# Patient Record
Sex: Male | Born: 1940 | Race: White | Hispanic: No | Marital: Married | Smoking: Never smoker
Health system: Southern US, Community
[De-identification: ages and names within clinical notes are randomized; demographics above are authoritative.]

## PROBLEM LIST (undated history)

## (undated) DIAGNOSIS — B019 Varicella without complication: Secondary | ICD-10-CM

## (undated) DIAGNOSIS — D72819 Decreased white blood cell count, unspecified: Secondary | ICD-10-CM

## (undated) DIAGNOSIS — K219 Gastro-esophageal reflux disease without esophagitis: Secondary | ICD-10-CM

## (undated) DIAGNOSIS — D7381 Neutropenic splenomegaly: Secondary | ICD-10-CM

## (undated) HISTORY — DX: Neutropenic splenomegaly: D73.81

## (undated) HISTORY — PX: NO PAST SURGERIES: SHX2092

## (undated) HISTORY — DX: Gastro-esophageal reflux disease without esophagitis: K21.9

## (undated) HISTORY — DX: Varicella without complication: B01.9

## (undated) HISTORY — DX: Decreased white blood cell count, unspecified: D72.819

---

## 2013-07-07 ENCOUNTER — Encounter (HOSPITAL_BASED_OUTPATIENT_CLINIC_OR_DEPARTMENT_OTHER): Payer: Self-pay | Admitting: Emergency Medicine

## 2013-07-07 ENCOUNTER — Emergency Department (HOSPITAL_BASED_OUTPATIENT_CLINIC_OR_DEPARTMENT_OTHER)
Admission: EM | Admit: 2013-07-07 | Discharge: 2013-07-07 | Disposition: A | Discharge status: Home / Self Care | Payer: Medicare Other | Attending: Emergency Medicine | Admitting: Emergency Medicine

## 2013-07-07 ENCOUNTER — Emergency Department (HOSPITAL_BASED_OUTPATIENT_CLINIC_OR_DEPARTMENT_OTHER): Payer: Medicare Other

## 2013-07-07 DIAGNOSIS — D72819 Decreased white blood cell count, unspecified: Secondary | ICD-10-CM | POA: Diagnosis not present

## 2013-07-07 DIAGNOSIS — Z79899 Other long term (current) drug therapy: Secondary | ICD-10-CM | POA: Diagnosis not present

## 2013-07-07 DIAGNOSIS — D696 Thrombocytopenia, unspecified: Secondary | ICD-10-CM | POA: Diagnosis not present

## 2013-07-07 DIAGNOSIS — R5383 Other fatigue: Secondary | ICD-10-CM | POA: Diagnosis not present

## 2013-07-07 DIAGNOSIS — R4789 Other speech disturbances: Secondary | ICD-10-CM | POA: Diagnosis not present

## 2013-07-07 DIAGNOSIS — D709 Neutropenia, unspecified: Secondary | ICD-10-CM | POA: Diagnosis not present

## 2013-07-07 DIAGNOSIS — R5381 Other malaise: Secondary | ICD-10-CM | POA: Insufficient documentation

## 2013-07-07 LAB — BASIC METABOLIC PANEL
BUN: 7 mg/dL (ref 6–23)
CALCIUM: 9.6 mg/dL (ref 8.4–10.5)
CHLORIDE: 111 meq/L (ref 96–112)
CO2: 20 mEq/L (ref 19–32)
CREATININE: 0.8 mg/dL (ref 0.50–1.35)
GFR, EST NON AFRICAN AMERICAN: 88 mL/min — AB (ref >90)
Glucose, Bld: 98 mg/dL (ref 70–99)
Potassium: 3.9 mEq/L (ref 3.7–5.3)
Sodium: 143 mEq/L (ref 137–147)

## 2013-07-07 LAB — HEPATIC FUNCTION PANEL
ALT: 26 U/L (ref 0–53)
AST: 33 U/L (ref 0–37)
Albumin: 3.4 g/dL — ABNORMAL LOW (ref 3.5–5.2)
Alkaline Phosphatase: 69 U/L (ref 39–117)
Bilirubin, Direct: <0.2 mg/dL (ref 0.0–0.3)
Total Bilirubin: 1 mg/dL (ref 0.3–1.2)
Total Protein: 6.6 g/dL (ref 6.0–8.3)

## 2013-07-07 LAB — URINALYSIS, ROUTINE W REFLEX MICROSCOPIC
Bilirubin Urine: NEGATIVE
Glucose, UA: NEGATIVE mg/dL
Hgb urine dipstick: NEGATIVE
Ketones, ur: NEGATIVE mg/dL
Leukocytes, UA: NEGATIVE
Nitrite: NEGATIVE
Protein, ur: NEGATIVE mg/dL
Specific Gravity, Urine: 1.006 (ref 1.005–1.030)
Urobilinogen, UA: 1 mg/dL (ref 0.0–1.0)
pH: 7.5 (ref 5.0–8.0)

## 2013-07-07 LAB — DIFFERENTIAL
Basophils Absolute: 0 10*3/uL (ref 0.0–0.1)
Basophils Relative: 1 % (ref 0–1)
EOS PCT: 4 % (ref 0–5)
Eosinophils Absolute: 0.1 10*3/uL (ref 0.0–0.7)
LYMPHS ABS: 1 10*3/uL (ref 0.7–4.0)
Lymphocytes Relative: 33 % (ref 12–46)
Monocytes Absolute: 0.3 10*3/uL (ref 0.1–1.0)
Monocytes Relative: 10 % (ref 3–12)
NEUTROS PCT: 52 % (ref 43–77)
Neutro Abs: 1.6 10*3/uL — ABNORMAL LOW (ref 1.7–7.7)

## 2013-07-07 LAB — CBC
HCT: 39.8 % (ref 39.0–52.0)
Hemoglobin: 14.3 g/dL (ref 13.0–17.0)
MCH: 33 pg (ref 26.0–34.0)
MCHC: 35.9 g/dL (ref 30.0–36.0)
MCV: 91.9 fL (ref 78.0–100.0)
Platelets: 66 K/uL — ABNORMAL LOW (ref 150–400)
RBC: 4.33 MIL/uL (ref 4.22–5.81)
RDW: 13.5 % (ref 11.5–15.5)
WBC: 2.9 K/uL — ABNORMAL LOW (ref 4.0–10.5)

## 2013-07-07 LAB — CBG MONITORING, ED: Glucose-Capillary: 88 mg/dL (ref 70–99)

## 2013-07-07 NOTE — ED Notes (Signed)
IV d/c, cath intact, pt tolerated well

## 2013-07-07 NOTE — ED Provider Notes (Signed)
CSN: [id number]     Arrival date & time 07/07/13  1114 History   First MD Initiated Contact with Patient 07/07/13 1318     Chief Complaint  Patient presents with  . Aphasia     (Consider location/radiation/quality/duration/timing/severity/associated sxs/prior Treatment) HPI 73 year old male presents with slurred speech and fatigue over the last week or so. His wife states that she notices speech was slurred a week ago and has been gradually improving. The patient never had any facial droop. He had a "sinus headache" about 3 weeks ago that is improved or resolved. No nausea, vomiting, or focal weakness. She initially stated he had trouble walking, but she clarified this to be that he is walking slowly. He's not had any stumbling or trouble walking mechanical standpoint. He seems somewhat fatigued but this is been improving. He's not had any weight loss or blood in his stools. At this time she states his slurred speech is almost back to normal but still slightly abnormal. She discussed all this with her son and he recommended she come to the ER. The patient has no known medical problems but has not seen a doctor in about 30 years because he has not had any issues.  History reviewed. No pertinent past medical history. History reviewed. No pertinent past surgical history. No family history on file. History  Substance Use Topics  . Smoking status: Never Smoker   . Smokeless tobacco: Not on file  . Alcohol Use: No    Review of Systems  Constitutional: Positive for fatigue. Negative for fever.  Cardiovascular: Negative for chest pain.  Gastrointestinal: Negative for nausea, vomiting, abdominal pain, diarrhea and blood in stool.  Genitourinary: Negative for dysuria.  Neurological: Positive for speech difficulty. Negative for dizziness, weakness, light-headedness and headaches.  All other systems reviewed and are negative.     Allergies  Review of patient's allergies indicates no known  allergies.  Home Medications   Prior to Admission medications   Medication Sig Start Date End Date Taking? Authorizing Provider  omeprazole (PRILOSEC) 10 MG capsule Take 10 mg by mouth daily.   Yes Historical Provider, MD   BP 160/88  Pulse 99  Temp(Src) 98 F (36.7 C) (Oral)  Resp 20  Ht 5' 9"  (1.753 m)  Wt 195 lb (88.451 kg)  BMI 28.78 kg/m2  SpO2 99% Physical Exam  Nursing note and vitals reviewed. Constitutional: He is oriented to person, place, and time. He appears well-developed and well-nourished.  HENT:  Head: Normocephalic and atraumatic.  Right Ear: External ear normal.  Left Ear: External ear normal.  Nose: Nose normal.  Eyes: EOM are normal. Pupils are equal, round, and reactive to light. Right eye exhibits no discharge. Left eye exhibits no discharge.  Neck: Neck supple.  Cardiovascular: Normal rate, regular rhythm, normal heart sounds and intact distal pulses.   Pulmonary/Chest: Effort normal.  Abdominal: Soft. He exhibits no distension. There is no tenderness.  Musculoskeletal: He exhibits no edema.  Neurological: He is alert and oriented to person, place, and time. He has normal strength and normal reflexes. No cranial nerve deficit or sensory deficit. He exhibits normal muscle tone. GCS eye subscore is 4. GCS verbal subscore is 5. GCS motor subscore is 6.  CN 2-12 grossly intact. 5/5 strength in all 4 extremities. Normal finger to nose. Normal gait. No slurred speech noted.  Skin: Skin is warm and dry.    ED Course  Procedures (including critical care time) Labs Review Labs Reviewed  BASIC METABOLIC PANEL -  Abnormal; Notable for the following:    GFR calc non Af Amer 88 (*)    All other components within normal limits  CBC - Abnormal; Notable for the following:    WBC 2.9 (*)    Platelets 66 (*)    All other components within normal limits  HEPATIC FUNCTION PANEL - Abnormal; Notable for the following:    Albumin 3.4 (*)    All other components within  normal limits  DIFFERENTIAL - Abnormal; Notable for the following:    Neutro Abs 1.6 (*)    All other components within normal limits  URINALYSIS, ROUTINE W REFLEX MICROSCOPIC  PATHOLOGIST SMEAR REVIEW  CBG MONITORING, ED    Imaging Review Ct Head Wo Contrast  07/07/2013   CLINICAL DATA:  Slurred speech and unsteady gait.  EXAM: CT HEAD WITHOUT CONTRAST  TECHNIQUE: Contiguous axial images were obtained from the base of the skull through the vertex without intravenous contrast.  COMPARISON:  None.  FINDINGS: No acute cortical infarct, hemorrhage, or mass lesion ispresent. Ventricles are of normal size. No significant extra-axial fluid collection is present. The paranasal sinuses andmastoid air cells are clear. The osseous skull is intact.  IMPRESSION: Normal exam.   Electronically Signed   By: Kerby Moors M.D.   On: 07/07/2013 13:58     EKG Interpretation   Date/Time:  Sunday July 07 2013 12:33:44 EDT Ventricular Rate:  82 PR Interval:  174 QRS Duration: 110 QT Interval:  404 QTC Calculation: 472 R Axis:   65 Text Interpretation:  Normal sinus rhythm Normal ECG No old tracing to  compare Confirmed by Norris ([id number]) on 07/07/2013 12:46:51 PM      MDM   Final diagnoses:  Fatigue  Thrombocytopenia  Leukopenia    Patient's exam is benign here. Normal neuro testing. I do not notice any slurred speech or focal neuro deficits. His exam is otherwise unremarkable. No signs of dehydration. No obvious cause for his vague fatigue. His CT is benign after 1 week's worth of symptoms. I discussed his leukopenia and thrombocytopenia with Dr. Jana Hakim, hematology on call, who recommends sending a peripheral smear for pathology review and referring to hematology here in [facility] (Dr. Marin Olp). Patient also needs a PCP, given referrals. I stressed the importance of follow up and establishing PCP. It's possible he had a stroke, but as he is asymptomatic, I feel he can follow up for  close testing with a PCP and possibly an outpatient MRI. Discussed with Dr. Leonel Ramsay, who agrees at this time, recommends baby ASA but with patient's thrombocytopenia will hold off until evaluated by heme/onc    Ephraim Hamburger, MD 07/07/13 [phone]

## 2013-07-07 NOTE — ED Notes (Addendum)
Patient began having slurred speech and unsteady gait about a week ago. Wife states that their son is who told them to come to the ER, otherwise patient has not seen a dr. Since this began. She states that today he is improved from what he was when these symptoms started. Wife states he has not seen a dr in appx 20 years. No noticeable deficits

## 2013-07-07 NOTE — Discharge Instructions (Signed)
Fatigue Fatigue is a feeling of tiredness, lack of energy, lack of motivation, or feeling tired all the time. Having enough rest, good nutrition, and reducing stress will normally reduce fatigue. Consult your caregiver if it persists. The nature of your fatigue will help your caregiver to find out its cause. The treatment is based on the cause.  CAUSES  There are many causes for fatigue. Most of the time, fatigue can be traced to one or more of your habits or routines. Most causes fit into one or more of three general areas. They are: Lifestyle problems  Sleep disturbances.  Overwork.  Physical exertion.  Unhealthy habits.  Poor eating habits or eating disorders.  Alcohol and/or drug use .  Lack of proper nutrition (malnutrition). Psychological problems  Stress and/or anxiety problems.  Depression.  Grief.  Boredom. Medical Problems or Conditions  Anemia.  Pregnancy.  Thyroid gland problems.  Recovery from major surgery.  Continuous pain.  Emphysema or asthma that is not well controlled  Allergic conditions.  Diabetes.  Infections (such as mononucleosis).  Obesity.  Sleep disorders, such as sleep apnea.  Heart failure or other heart-related problems.  Cancer.  Kidney disease.  Liver disease.  Effects of certain medicines such as antihistamines, cough and cold remedies, prescription pain medicines, heart and blood pressure medicines, drugs used for treatment of cancer, and some antidepressants. SYMPTOMS  The symptoms of fatigue include:   Lack of energy.  Lack of drive (motivation).  Drowsiness.  Feeling of indifference to the surroundings. DIAGNOSIS  The details of how you feel help guide your caregiver in finding out what is causing the fatigue. You will be asked about your present and past health condition. It is important to review all medicines that you take, including prescription and non-prescription items. A thorough exam will be done.  You will be questioned about your feelings, habits, and normal lifestyle. Your caregiver may suggest blood tests, urine tests, or other tests to look for common medical causes of fatigue.  TREATMENT  Fatigue is treated by correcting the underlying cause. For example, if you have continuous pain or depression, treating these causes will improve how you feel. Similarly, adjusting the dose of certain medicines will help in reducing fatigue.  HOME CARE INSTRUCTIONS   Try to get the required amount of good sleep every night.  Eat a healthy and nutritious diet, and drink enough water throughout the day.  Practice ways of relaxing (including yoga or meditation).  Exercise regularly.  Make plans to change situations that cause stress. Act on those plans so that stresses decrease over time. Keep your work and personal routine reasonable.  Avoid street drugs and minimize use of alcohol.  Start taking a daily multivitamin after consulting your caregiver. SEEK MEDICAL CARE IF:   You have persistent tiredness, which cannot be accounted for.  You have fever.  You have unintentional weight loss.  You have headaches.  You have disturbed sleep throughout the night.  You are feeling sad.  You have constipation.  You have dry skin.  You have gained weight.  You are taking any new or different medicines that you suspect are causing fatigue.  You are unable to sleep at night.  You develop any unusual swelling of your legs or other parts of your body. SEEK IMMEDIATE MEDICAL CARE IF:   You are feeling confused.  Your vision is blurred.  You feel faint or pass out.  You develop severe headache.  You develop severe abdominal, pelvic, or  back pain.  You develop chest pain, shortness of breath, or an irregular or fast heartbeat.  You are unable to pass a normal amount of urine.  You develop abnormal bleeding such as bleeding from the rectum or you vomit blood.  You have thoughts  about harming yourself or committing suicide.  You are worried that you might harm someone else. MAKE SURE YOU:   Understand these instructions.  Will watch your condition.  Will get help right away if you are not doing well or get worse. Document Released: 11/07/2006 Document Revised: 04/04/2011 Document Reviewed: 11/07/2006 [facility] Patient Information 2014 [place].    Emergency Department Resource Guide 1) Find a Doctor and Pay Out of Pocket Although you won't have to find out who is covered by your insurance plan, it is a good idea to ask around and get recommendations. You will then need to call the office and see if the doctor you have chosen will accept you as a new patient and what types of options they offer for patients who are self-pay. Some doctors offer discounts or will set up payment plans for their patients who do not have insurance, but you will need to ask so you aren't surprised when you get to your appointment.  2) Contact Your Local Health Department Not all health departments have doctors that can see patients for sick visits, but many do, so it is worth a call to see if yours does. If you don't know where your local health department is, you can check in your phone book. The CDC also has a tool to help you locate your state's health department, and many state websites also have listings of all of their local health departments.  3) Find a [facility] If your illness is not likely to be very severe or complicated, you may want to try a walk in clinic. These are popping up all over the country in pharmacies, drugstores, and shopping centers. They're usually staffed by nurse practitioners or physician assistants that have been trained to treat common illnesses and complaints. They're usually fairly quick and inexpensive. However, if you have serious medical issues or chronic medical problems, these are probably not your best option.  No Primary Care  Doctor: - Call Health Connect at  [phone] - they can help you locate a primary care doctor that  accepts your insurance, provides certain services, etc. - Physician Referral Service- [phone]  Chronic Pain Problems: Organization         Address  Phone   Notes  [facility]  [phone] Patients need to be referred by their primary care doctor.   Medication Assistance: Organization         Address  Phone   Notes  [facility] Medication [facility] [address] [place], [place] [address] [phone] --Must be a resident of [facility] -- Must have NO insurance coverage whatsoever (no Medicaid/ Medicare, etc.) -- The pt. MUST have a primary care doctor that directs their care regularly and follows them in the community   MedAssist  [phone]   [employer]  [phone]    Agencies that provide inexpensive medical care: Organization         Address  Phone   Notes  [place]  [phone]   [facility]    [phone]   [facility] [place], [place] [address] [phone]  [facility] [address] [place] [phone]   Planned Parenthood    [phone]   [facility]    [phone]   [place] and [place] [address], [place] Phone:  [phone], Fax:  [phone] Hours of Operation:  9 am - 6 pm, M-F.  Also accepts Medicaid/Medicare and self-pay.  [facility] for [place], [address], [place] Phone: [phone], Fax: [phone]. Hours of Operation:  8:30 am - 5:30 pm, M-F.  Also accepts Medicaid and self-pay.  [facility] [address] [place] Phone: [phone]   [place], [place], [place] [phone], Ext. [phone] Mondays & Thursdays: 7-9 AM.  First 15 patients are seen on a first  come, first serve basis.    [place] Providers:  Organization         Address  Phone   Notes  [facility] [address], [place] [phone] Also accepts self-pay patients.  [facility] [address], [place]  [phone]   [place], [address], [place] [phone]   [facility] [address], [place] [phone]   Lucianne Lei [address], [place]   [phone] Only accepts [place] Access [place] patients after they have their name applied to their card.   Self-Pay (no insurance) in [facility]:  Organization         Address  Phone   Notes  Sickle Cell Patients, [facility] [place] [phone]   [facility] [place] [phone]   [facility] [place]  [place], [place], [place] [phone]   [facility]/Dr. Osei-Bonsu  [address] [place] or [address], [place] [phone] Phone number for both [place] and [place] locations is the same.  [facility]gent [facility] and [facility] [address] [place] [phone]   [facility] [address] [place] or [address] [phone]   [facility] [address] [place] [phone], phone; [phone], fax Sees patients 1st and 3rd Saturday of every month.  Must not qualify for public or private insurance (i.e. Medicaid, Medicare, [place] Health Choice, Veterans' Benefits)  Household income should be no more than 200% of the poverty level The clinic cannot treat you if you are pregnant or think you are pregnant  Sexually transmitted diseases are not treated at the clinic.    Dental Care: Organization          Address  Phone  Notes  [facility] Department of [facility] [phone] Accepts children up to age 35 who are enrolled in [place] or [place]; pregnant women with a Medicaid card; and children who have applied for Medicaid or  Health Choice, but were declined, whose parents can pay a reduced fee at time of service.  [facility] Department of [facility]  [address], [place] [phone] Accepts children up to age 49 who are enrolled in [place] or [place]; pregnant women with a Medicaid card; and  children who have applied for Medicaid or [place] Health Choice, but were declined, whose parents can pay a reduced fee at time of service.  [place] Adult [facility] Access PROGRAM  [place] [phone] Patients are seen by appointment only. Walk-ins are not accepted. [place] will see patients 63 years of age and older. Monday - Tuesday (8am-5pm) Most Wednesdays (8:30-5pm) $30 per visit, cash only  [facility] Adult Dental Access PROGRAM  [address], [facility] [phone] Patients are seen by appointment only. Walk-ins are not accepted. [place] will see patients 60 years of age and older. One Wednesday Evening (Monthly: Volunteer Based).  $30 per visit, cash only  [place]  [phone] for adults; Children under age 35, call Graduate Pediatric Dentistry at [phone]. Children aged 90-14, please call [phone] to request a pediatric application.  Dental services are provided in all areas of dental care including fillings, crowns and bridges, complete and partial dentures, implants, gum treatment, root canals, and extractions. Preventive care is also provided. Treatment is provided to both adults and children. Patients are selected via a lottery and there is often a waiting list.   [facility] [address] [place]  [phone] [email or web address]   [facility] [address] [place], [place] [phone], Ext. [phone] Second and Fourth Thursday of each month, opens at 6:30 AM; Clinic ends at 9 AM.  Patients are seen on a first-come first-served basis, and a limited number are seen during each clinic.   [facility]  [address] Hillard Danker [place], [place] [phone]   Eligibility Requirements You must have lived in [place], [place], or [place] counties for at least the last three months.   You cannot be eligible for state or federal sponsored [employer], including [employer], [place], or [employer].   You generally cannot be eligible for healthcare insurance through your employer.    How to apply: Eligibility screenings are held every Tuesday and Wednesday afternoon from 1:00 pm until 4:00 pm. You do not need an appointment for the interview!  [facility] [address] [place], [place]   [place]  [place] Department  [place]  [phone]    Behavioral Health Resources in the Community: Intensive Outpatient Programs Organization         Address  Phone  Notes  [place]. [address], [place], [place] [phone]   [facility] [address] [place], [place]   ADS: Alcohol & Drug Svcs [address] [place], [place]   [place] [address]  [place], [place] or [phone]   Substance Abuse Resources Organization         Address  Phone  Notes  Alcohol and Drug Services  [phone]   [place]  [phone]   The [place]   Chinita Pester  [phone]   Residential & Outpatient Substance Abuse Program  [phone]   Psychological  Services Organization         Address  Phone  Notes  [facility] [place]  [place]  [phone]   [place] [address] [place] or [phone]    Mobile Crisis Teams Organization  Address  Phone  Notes  [facility], Mobile Crisis Care Unit  [phone]   [facility] Psychotherapeutic Services  [address] [place], [place]   [facility] [address] [place] [phone]    Self-Help/Support Groups Organization         Address  Phone             Notes  [place]. of [place] - variety of support groups  [place] Call for more information  Narcotics Anonymous (NA), [facility] [address], [employer] [place]  2 meetings at this location   Special educational needs teacher         Address  Phone  Notes  ASAP Residential Treatment [place],    [place]  [phone]   [facility]  [address] [place] [phone], [place], [place]   [place], [place] [phone] Admissions: 8am-3pm M-F  Incentives Substance [place] [address]    [place], [place] [phone]   [facility] [address] [place], [place], [place]   [facility] [address],  [place], [place]   [facility] - Intensive Outpatient [address], [place], [place]   [facility] ([place].) [place].,  [place], [place] [phone] or [phone]   [facility] Services (RTS) [address] [place], [place] Accepts Medicaid  [facility] [place] [address],  [place] [phone] Substance Abuse/Addiction Treatment   [facility] Organization         Address  Phone  Notes  [facility]  [phone]   Domenic Schwab, PhD [address] Arlis Porta [place], [place]   [phone] or [phone]   [place], [place] [phone]   [facility] [address], [place], [place] [phone] Insurance/Medicaid/sponsorship through [facility] and Families [address] [place]                                    [place], [place] [phone] Maish Vaya [address], [place] [phone]    Dr. Adele Schilder  [phone]   [facility] Dept. 1) [address] [place] [address],  City 2) [place] 3)  [address], [place] [phone]  [phone]   [place] [phone] or [phone] (After Hours)

## 2013-07-08 LAB — PATHOLOGIST SMEAR REVIEW

## 2013-07-10 ENCOUNTER — Telehealth: Payer: Self-pay | Admitting: Hematology & Oncology

## 2013-07-10 NOTE — Telephone Encounter (Signed)
Pt aware of 6-23 appointment

## 2013-07-16 ENCOUNTER — Encounter: Payer: Self-pay | Admitting: Hematology & Oncology

## 2013-07-16 ENCOUNTER — Other Ambulatory Visit (HOSPITAL_BASED_OUTPATIENT_CLINIC_OR_DEPARTMENT_OTHER): Payer: Medicare Other | Admitting: Lab

## 2013-07-16 ENCOUNTER — Ambulatory Visit: Payer: Medicare Other

## 2013-07-16 ENCOUNTER — Ambulatory Visit (HOSPITAL_BASED_OUTPATIENT_CLINIC_OR_DEPARTMENT_OTHER): Payer: Medicare Other | Admitting: Hematology & Oncology

## 2013-07-16 VITALS — BP 150/84 | HR 93 | Temp 97.6°F | Resp 18 | Ht 69.0 in | Wt 201.0 lb

## 2013-07-16 DIAGNOSIS — D72819 Decreased white blood cell count, unspecified: Secondary | ICD-10-CM | POA: Diagnosis not present

## 2013-07-16 DIAGNOSIS — D61818 Other pancytopenia: Secondary | ICD-10-CM

## 2013-07-16 DIAGNOSIS — D696 Thrombocytopenia, unspecified: Secondary | ICD-10-CM

## 2013-07-16 DIAGNOSIS — D649 Anemia, unspecified: Secondary | ICD-10-CM | POA: Diagnosis not present

## 2013-07-16 DIAGNOSIS — M329 Systemic lupus erythematosus, unspecified: Secondary | ICD-10-CM | POA: Diagnosis not present

## 2013-07-16 DIAGNOSIS — D510 Vitamin B12 deficiency anemia due to intrinsic factor deficiency: Secondary | ICD-10-CM | POA: Diagnosis not present

## 2013-07-16 LAB — CBC WITH DIFFERENTIAL (CANCER CENTER ONLY)
BASO#: 0 10*3/uL (ref 0.0–0.2)
BASO%: 1.1 % (ref 0.0–2.0)
EOS%: 3.4 % (ref 0.0–7.0)
Eosinophils Absolute: 0.1 10*3/uL (ref 0.0–0.5)
HEMATOCRIT: 43 % (ref 38.7–49.9)
HGB: 15.2 g/dL (ref 13.0–17.1)
LYMPH#: 1.4 10*3/uL (ref 0.9–3.3)
LYMPH%: 40.3 % (ref 14.0–48.0)
MCH: 32.5 pg (ref 28.0–33.4)
MCHC: 35.3 g/dL (ref 32.0–35.9)
MCV: 92 fL (ref 82–98)
MONO#: 0.4 10*3/uL (ref 0.1–0.9)
MONO%: 10.4 % (ref 0.0–13.0)
NEUT#: 1.6 10*3/uL (ref 1.5–6.5)
NEUT%: 44.8 % (ref 40.0–80.0)
Platelets: 80 10*3/uL — ABNORMAL LOW (ref 145–400)
RBC: 4.68 10*6/uL (ref 4.20–5.70)
RDW: 14.1 % (ref 11.1–15.7)
WBC: 3.6 10*3/uL — AB (ref 4.0–10.0)

## 2013-07-16 LAB — CHCC SATELLITE - SMEAR

## 2013-07-16 LAB — IRON AND TIBC CHCC
%SAT: 51 % (ref 20–55)
Iron: 160 ug/dL (ref 42–163)
TIBC: 313 ug/dL (ref 202–409)
UIBC: 152 ug/dL (ref 117–376)

## 2013-07-16 LAB — FERRITIN CHCC: FERRITIN: 59 ng/mL (ref 22–316)

## 2013-07-17 ENCOUNTER — Telehealth: Payer: Self-pay | Admitting: Hematology & Oncology

## 2013-07-17 NOTE — Telephone Encounter (Signed)
Pt aware of 9-24 appointment

## 2013-07-17 NOTE — Progress Notes (Signed)
Referral MD  Reason for Referral: Leukopenia and thrombocytopenia   Chief Complaint  Patient presents with  . HOSPITAL FOLLOW UP, NEW PATIENT  : I am here because my blood  HPI: Steve Watson is a 73 year old gentleman. He does not have a family doctor. He's not been to a doctor for a long time. He is on no Medications. He is working full-time.  He went to the emergency room because of some slurred speech and some difficulty walking. He had a CT of the brain which is negative. Get lab work done which showed a white cell count 2.9. His platelet count was 66,000. Hemoglobin was 14.3. His life at for okay.  He was then referred to the [facility] for an evaluation.  He's had no rashes. There's been no bleeding. He's had no bruising. He's had no abdominal pain. He's had no cough. He does not smoke or drink.  Is no occupational exposures where he works.  He's had no difficulties talking right now. He's had no visual issues. He's not noted any palpable lymph nodes. He is not a vegetarian. He has been eating good. He's had no weight loss or weight gain.   Again, he was referred to the [facility] for an evaluation.   No past medical history on file.:  No past surgical history on file.:  Current outpatient prescriptions:calcium & magnesium carbonates (MYLANTA) 311-232 MG per tablet, Take 1 tablet by mouth daily., Disp: , Rfl: ;  IRON, FERROUS GLUCONATE, PO, Take by mouth every morning., Disp: , Rfl: ;  omeprazole (PRILOSEC) 10 MG capsule, Take 10 mg by mouth as needed. , Disp: , Rfl: :  :  No Known Allergies:  No family history on file.:  History   Social History  . Marital Status: Married    Spouse Name: N/A    Number of Children: N/A  . Years of Education: N/A   Occupational History  . Not on file.   Social History Main Topics  . Smoking status: Never Smoker   . Smokeless tobacco: Never Used     Comment: NEVER USED TOBACCO  . Alcohol Use: No  . Drug Use: Not on file   . Sexual Activity: Not on file   Other Topics Concern  . Not on file   Social History Narrative  . No narrative on file  :  Pertinent items are noted in HPI.  Exam: @IPVITALS @  well-developed well-nourished white cell and in no obvious distress. Vital signs show a temperature of 97.6. Pulse 93. Blood pressure 150/84. Weight is 201 pounds. Head and neck exam shows no ocular or oral lesions. Is no sclera icterus. Has no adenopathy in the neck. Extraocular muscles are intact. There are no intraoral lesions. Lungs are clear. Cardiac exam regular in rhythm. Abdomen soft. Has good bowel sounds. There is no palpable liver or spleen tip. Back exam shows no tenderness over the spine ribs or hips. Extremities shows no clubbing cyanosis or edema. Skin exam no rashes, ecchymoses or petechia. Neurological exam shows no focal neurological deficits.    Recent Labs  07/16/13 0806  WBC 3.6*  HGB 15.2  HCT 43.0  PLT 80 Platelet count consistent in citrate*   No results found for this basename: NA, K, CL, CO2, GLUCOSE, BUN, CREATININE, CALCIUM,  in the last 72 hours  Blood smear review: Normochromic and normocytic population of red blood cells. There are no nucleated red blood cells. I see no teardrop cells. He has no rouleau formation.  There are no target cells. I see no schistocytes or spherocytes. White cells are slightly decreased number. He has no hypersegmented polys. I see no immature myeloid cells per there are no atypical lymphocytes. Toes are decreased in number. Platelets are small. Platelets are well granulated.  Pathology: No data     Assessment and Plan: He is a 73 year old gentleman with leukopenia and thrombocytopenia.  So far, are lab work is normal. He has a normal LDH of 170. His iron studies are normal. Vitamin B12 is 480.  I find no on physical exam that was just a problem. I don't see any indication for a bone marrow issue on the blood smear. A 73 years old,, one might  suspect myelodysplasia.  His lab work is better that was weeks ago. Maybe he has have a viral syndrome that is resolving.  I see no risk factors for hepatitis or HIV.  He's been totally asymptomatic.  I do not see that we have to do a bone marrow test on him. I just don't think this would be fruitful right now.  Is a good hour with he and his wife. They're both very nice.  We will see how his lab work looks. Then we will plan to get him back to see [place] depending on what the labs show.

## 2013-07-18 ENCOUNTER — Telehealth: Payer: Self-pay | Admitting: *Deleted

## 2013-07-18 LAB — RETICULOCYTES (CHCC)
ABS Retic: 46.9 10*3/uL (ref 19.0–186.0)
RBC.: 4.69 MIL/uL (ref 4.22–5.81)
RETIC CT PCT: 1 % (ref 0.4–2.3)

## 2013-07-18 LAB — PROTEIN ELECTROPHORESIS, SERUM, WITH REFLEX
ALBUMIN ELP: 55.5 % — AB (ref 55.8–66.1)
Alpha-1-Globulin: 2.7 % — ABNORMAL LOW (ref 2.9–4.9)
Alpha-2-Globulin: 9.1 % (ref 7.1–11.8)
Beta 2: 6.3 % (ref 3.2–6.5)
Beta Globulin: 6.7 % (ref 4.7–7.2)
Gamma Globulin: 19.7 % — ABNORMAL HIGH (ref 11.1–18.8)
TOTAL PROTEIN, SERUM ELECTROPHOR: 6.7 g/dL (ref 6.0–8.3)

## 2013-07-18 LAB — LUPUS ANTICOAGULANT PANEL
DRVVT: 40.5 secs (ref <42.9)
Lupus Anticoagulant: NOT DETECTED
PTT LA: 45.6 s — AB (ref 28.0–43.0)
PTTLA 41 MIX: 50.3 s — AB (ref 28.0–43.0)
PTTLA CONFIRMATION: 1.1 s (ref <8.0)

## 2013-07-18 LAB — ERYTHROPOIETIN: Erythropoietin: 14.2 m[IU]/mL (ref 2.6–18.5)

## 2013-07-18 LAB — CARDIOLIPIN ANTIBODIES, IGG, IGM, IGA
ANTICARDIOLIPIN IGA: 11 U/mL (ref <22)
ANTICARDIOLIPIN IGM: 15 [MPL'U]/mL — AB (ref <11)
Anticardiolipin IgG: 8 GPL U/mL (ref <23)

## 2013-07-18 LAB — LACTATE DEHYDROGENASE: LDH: 170 U/L (ref 94–250)

## 2013-07-18 LAB — VITAMIN B12: Vitamin B-12: 480 pg/mL (ref 211–911)

## 2013-07-18 LAB — RHEUMATOID FACTOR: RHEUMATOID FACTOR: 11 [IU]/mL (ref <=14)

## 2013-07-18 NOTE — Telephone Encounter (Signed)
Message copied by Rico Ala on Thu Jul 18, 2013 11:10 AM ------      Message from: Burney Gauze R      Created: Wed Jul 17, 2013  6:07 PM       Call - labs are ok so far!!  Laurey Arrow ------

## 2013-07-22 ENCOUNTER — Telehealth: Payer: Self-pay | Admitting: *Deleted

## 2013-07-22 NOTE — Telephone Encounter (Addendum)
Message copied by Lenn Sink on Mon Jul 22, 2013  9:21 AM ------      Message from: Volanda Napoleon      Created: Fri Jul 19, 2013  5:55 PM       Call - he needs to take a coated BAby aspirin - 1m - a day WITH food!!!!  pete ------Called and let pt know that Dr EMarin Olpwants him to take 822mbaby aspirin a day. Pt states he started taking the baby aspirin the day after his last visit.

## 2013-10-17 ENCOUNTER — Ambulatory Visit (HOSPITAL_BASED_OUTPATIENT_CLINIC_OR_DEPARTMENT_OTHER): Payer: Medicare Other | Admitting: Hematology & Oncology

## 2013-10-17 ENCOUNTER — Ambulatory Visit (HOSPITAL_BASED_OUTPATIENT_CLINIC_OR_DEPARTMENT_OTHER): Payer: Medicare Other | Admitting: Lab

## 2013-10-17 DIAGNOSIS — D696 Thrombocytopenia, unspecified: Secondary | ICD-10-CM

## 2013-10-17 DIAGNOSIS — D649 Anemia, unspecified: Secondary | ICD-10-CM

## 2013-10-17 DIAGNOSIS — D510 Vitamin B12 deficiency anemia due to intrinsic factor deficiency: Secondary | ICD-10-CM

## 2013-10-17 DIAGNOSIS — D61818 Other pancytopenia: Secondary | ICD-10-CM

## 2013-10-17 LAB — CBC WITH DIFFERENTIAL (CANCER CENTER ONLY)
BASO#: 0 10*3/uL (ref 0.0–0.2)
BASO%: 0.8 % (ref 0.0–2.0)
EOS%: 3.3 % (ref 0.0–7.0)
Eosinophils Absolute: 0.2 10*3/uL (ref 0.0–0.5)
HEMATOCRIT: 41.3 % (ref 38.7–49.9)
HGB: 14.8 g/dL (ref 13.0–17.1)
LYMPH#: 1.9 10*3/uL (ref 0.9–3.3)
LYMPH%: 38.8 % (ref 14.0–48.0)
MCH: 33.5 pg — AB (ref 28.0–33.4)
MCHC: 35.8 g/dL (ref 32.0–35.9)
MCV: 93 fL (ref 82–98)
MONO#: 0.4 10*3/uL (ref 0.1–0.9)
MONO%: 8.5 % (ref 0.0–13.0)
NEUT#: 2.4 10*3/uL (ref 1.5–6.5)
NEUT%: 48.6 % (ref 40.0–80.0)
Platelets: 108 10*3/uL — ABNORMAL LOW (ref 145–400)
RBC: 4.42 10*6/uL (ref 4.20–5.70)
RDW: 12.9 % (ref 11.1–15.7)
WBC: 4.8 10*3/uL (ref 4.0–10.0)

## 2013-10-17 LAB — CHCC SATELLITE - SMEAR

## 2013-10-17 NOTE — Progress Notes (Signed)
  Hematology and Oncology Follow Up Visit  Steve Watson [id number] 12/09/1940 73 y.o. 10/17/2013   Principle Diagnosis:   Thrombocytopenia  Current Therapy:    Observation     Interim History:  Mr.  Steve Watson is back for a second office visit. He's been doing quite well. When we first saw him, all his lower looked unremarkable. His blood smear looked okay.  He's had a positive bleeding. He's had a bruising. No weight loss or weight gain. He's had no fever. There's been no change in medications.  Medications: Current outpatient prescriptions:aspirin 81 MG tablet, Take 81 mg by mouth daily., Disp: , Rfl: ;  calcium & magnesium carbonates (MYLANTA) 311-232 MG per tablet, Take 1 tablet by mouth daily., Disp: , Rfl: ;  omeprazole (PRILOSEC) 10 MG capsule, Take 10 mg by mouth as needed. , Disp: , Rfl:   Allergies: No Known Allergies  Past Medical History, Surgical history, Social history, and Family History were reviewed and updated.  Review of Systems: As above  Physical Exam:  vitals were not taken for this visit.  Well-developed and well-nourished white gentleman. Vital signs are temperature of 98.6. Pulse 100. Blood pressure 166/90. Weight is 210 pounds. Head and neck exam shows no ocular or oral lesions. There are no palpable cervical or supraclavicular is. Lungs are clear. Cardiac exam regular rhythm. Abdomen soft. Has good bowel sounds. There is no fluid wave. There is no palpable liver or spleen tip. Extremities shows no clubbing, cyanosis or edema. Back exam no tenderness over the spine, ribs or hips. Neurological exam is nonfocal.  Lab Results  Component Value Date   WBC 4.8 10/17/2013   HGB 14.8 10/17/2013   HCT 41.3 10/17/2013   MCV 93 10/17/2013   PLT 108 Platelet count consistent in citrate* 10/17/2013     Chemistry      Component Value Date/Time   NA 143 07/07/2013 1215   K 3.9 07/07/2013 1215   CL 111 07/07/2013 1215   CO2 20 07/07/2013 1215   BUN 7 07/07/2013 1215   CREATININE 0.80 07/07/2013 1215      Component Value Date/Time   CALCIUM 9.6 07/07/2013 1215   ALKPHOS 69 07/07/2013 1215   AST 33 07/07/2013 1215   ALT 26 07/07/2013 1215   BILITOT 1.0 07/07/2013 1215         Impression and Plan: [name] is 73 year old gentleman. He has thrombocytopenia This is mild at best.  He actually is doing quite well. His blood count is improving.  I looked at his blood under the microscope. I do not see anything that looked suspicious. His platelets looked mature. He had well granulated platelets. His white cells and red cells appear to be mature. There were no nucleated red cells. He had no immature myeloid or atypical lymphoid cells.  I really do not see any problems with him. I cannot find anything on his physical exam.  For now, I did don't think we have to get him back to be seen. I just do not think we're adding anything to his medical care.  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  We can always see him back if he has any problems.  Volanda Napoleon, MD 9/24/20151:23 PM

## 2014-06-02 ENCOUNTER — Telehealth: Payer: Self-pay | Admitting: *Deleted

## 2014-06-02 ENCOUNTER — Encounter: Payer: Self-pay | Admitting: *Deleted

## 2014-06-02 NOTE — Telephone Encounter (Signed)
Unable to reach patient at time of Pre-Visit Call.  Left message for patient to return call when available.    

## 2014-06-02 NOTE — Addendum Note (Signed)
Addended by: Leticia Penna A on: 06/02/2014 05:07 PM   Modules accepted: Medications

## 2014-06-02 NOTE — Telephone Encounter (Signed)
Pre-Visit Call completed with patient and chart updated.   Pre-Visit Info documented in Specialty Comments under [facility].    

## 2014-06-02 NOTE — Telephone Encounter (Signed)
PATIENT CALLED BACK AND SAID TO CALL HIM AGAIN ANYTIME TODAY @ [phone]

## 2014-06-03 ENCOUNTER — Ambulatory Visit (INDEPENDENT_AMBULATORY_CARE_PROVIDER_SITE_OTHER): Payer: Medicare Other | Admitting: Physician Assistant

## 2014-06-03 ENCOUNTER — Encounter: Payer: Self-pay | Admitting: Physician Assistant

## 2014-06-03 VITALS — BP 150/80 | HR 115 | Temp 98.0°F | Wt 208.0 lb

## 2014-06-03 DIAGNOSIS — K219 Gastro-esophageal reflux disease without esophagitis: Secondary | ICD-10-CM | POA: Insufficient documentation

## 2014-06-03 DIAGNOSIS — R531 Weakness: Secondary | ICD-10-CM

## 2014-06-03 DIAGNOSIS — R000 Tachycardia, unspecified: Secondary | ICD-10-CM | POA: Diagnosis not present

## 2014-06-03 LAB — COMPREHENSIVE METABOLIC PANEL
ALT: 25 U/L (ref 0–53)
AST: 30 U/L (ref 0–37)
Albumin: 3.3 g/dL — ABNORMAL LOW (ref 3.5–5.2)
Alkaline Phosphatase: 67 U/L (ref 39–117)
BUN: 9 mg/dL (ref 6–23)
CALCIUM: 9.2 mg/dL (ref 8.4–10.5)
CO2: 22 meq/L (ref 19–32)
Chloride: 111 mEq/L (ref 96–112)
Creatinine, Ser: 0.79 mg/dL (ref 0.40–1.50)
GFR: 102.42 mL/min (ref >60.00)
GLUCOSE: 140 mg/dL — AB (ref 70–99)
POTASSIUM: 3.9 meq/L (ref 3.5–5.1)
SODIUM: 138 meq/L (ref 135–145)
TOTAL PROTEIN: 6.6 g/dL (ref 6.0–8.3)
Total Bilirubin: 0.9 mg/dL (ref 0.2–1.2)

## 2014-06-03 LAB — CBC WITH DIFFERENTIAL/PLATELET
Basophils Absolute: 0 10*3/uL (ref 0.0–0.1)
Basophils Relative: 1.1 % (ref 0.0–3.0)
Eosinophils Absolute: 0.1 10*3/uL (ref 0.0–0.7)
Eosinophils Relative: 3.2 % (ref 0.0–5.0)
HCT: 41.5 % (ref 39.0–52.0)
Hemoglobin: 14.5 g/dL (ref 13.0–17.0)
LYMPHS PCT: 33.4 % (ref 12.0–46.0)
Lymphs Abs: 1.2 10*3/uL (ref 0.7–4.0)
MCHC: 35 g/dL (ref 30.0–36.0)
MCV: 91.9 fl (ref 78.0–100.0)
MONOS PCT: 9.6 % (ref 3.0–12.0)
Monocytes Absolute: 0.3 10*3/uL (ref 0.1–1.0)
NEUTROS PCT: 52.7 % (ref 43.0–77.0)
Neutro Abs: 1.9 10*3/uL (ref 1.4–7.7)
PLATELETS: 76 10*3/uL — AB (ref 150.0–400.0)
RBC: 4.52 Mil/uL (ref 4.22–5.81)
RDW: 14.5 % (ref 11.5–15.5)
WBC: 3.6 10*3/uL — AB (ref 4.0–10.5)

## 2014-06-03 LAB — VITAMIN D 25 HYDROXY (VIT D DEFICIENCY, FRACTURES): VITD: 27.05 ng/mL — ABNORMAL LOW (ref 30.00–100.00)

## 2014-06-03 LAB — TSH: TSH: 0.86 u[IU]/mL (ref 0.35–4.50)

## 2014-06-03 NOTE — Assessment & Plan Note (Signed)
Unclear etiology.  Depression screen negative.  EKG without worrisome findings.  Will check CBC, CMP, TSH, Vitamin D. Encourage increased exercise, increased hydration and well-balanced diet. Follow-up will be based on lab results.

## 2014-06-03 NOTE — Progress Notes (Signed)
Patient presents to clinic today to establish care.  Acute Concerns: Patient endorses intermittent weakness described as no energy to do things.  States he first noticed about several weeks ago.  Is followed by Hematology for ITP. Denies bleeding at present.  Denies chest pain, SOB. Denies palpitations.  Denies unexplainable weight loss. Endorses weight gain.  Denies fevers, chills or night sweats.  Chronic Issues: GERD -- Currently on Prilosec with good relief of symptoms.  Takes only PRN.  Denies breakthrough symptoms. Only reflux symptoms twice per month.  Denies nausea, vomiting or abdominal pain.    Past Medical History  Diagnosis Date  . GERD (gastroesophageal reflux disease)     Past Surgical History  Procedure Laterality Date  . No past surgeries      Current Outpatient Prescriptions on File Prior to Visit  Medication Sig Dispense Refill  . aspirin 81 MG tablet Take 81 mg by mouth daily.    . calcium & magnesium carbonates (MYLANTA) 311-232 MG per tablet Take 1 tablet by mouth daily.    Marland Kitchen omeprazole (PRILOSEC) 10 MG capsule Take 10 mg by mouth as needed.      No current facility-administered medications on file prior to visit.    No Known Allergies  Family History  Problem Relation Age of Onset  . Hypertension Mother   . Hypertension Father   . Hypertension Brother   . Diabetes Brother   . Healthy Child   . Healthy Grandchild     History   Social History  . Marital Status: Married    Spouse Name: N/A  . Number of Children: N/A  . Years of Education: N/A   Occupational History  . Not on file.   Social History Main Topics  . Smoking status: Never Smoker   . Smokeless tobacco: Never Used     Comment: NEVER USED TOBACCO  . Alcohol Use: No  . Drug Use: No  . Sexual Activity: Yes   Other Topics Concern  . Not on file   Social History Narrative   Review of Systems  Constitutional: Positive for malaise/fatigue. Negative for fever, chills, weight  loss and diaphoresis.  Respiratory: Negative for cough and shortness of breath.   Cardiovascular: Negative for chest pain and palpitations.  Gastrointestinal: Negative for heartburn, nausea, vomiting, abdominal pain, diarrhea, constipation, blood in stool and melena.  Neurological: Negative for dizziness, loss of consciousness and headaches.  Psychiatric/Behavioral: Negative for depression, suicidal ideas, hallucinations and substance abuse. The patient is not nervous/anxious and does not have insomnia.    BP 150/80 mmHg  Pulse 115  Temp(Src) 98 F (36.7 C)  Wt 208 lb (94.348 kg)  SpO2 98%  Physical Exam  Constitutional: He is oriented to person, place, and time and well-developed, well-nourished, and in no distress.  HENT:  Head: Normocephalic and atraumatic.  Right Ear: External ear normal.  Left Ear: External ear normal.  Nose: Nose normal.  Mouth/Throat: Oropharynx is clear and moist. No oropharyngeal exudate.  Eyes: Conjunctivae and EOM are normal. Pupils are equal, round, and reactive to light.  Neck: Neck supple. No thyromegaly present.  Cardiovascular: Regular rhythm, normal heart sounds and intact distal pulses.   Pulmonary/Chest: Effort normal and breath sounds normal. No respiratory distress. He has no wheezes. He has no rales. He exhibits no tenderness.  Lymphadenopathy:    He has no cervical adenopathy.  Neurological: He is alert and oriented to person, place, and time. He has normal reflexes. No cranial nerve deficit. GCS  score is 15.  Skin: Skin is warm and dry. No rash noted.  Psychiatric: Affect normal.  Vitals reviewed.    Assessment/Plan: Tachycardia Mild elevation in pulse noted.  EKG reveals sinus tachycardia at rate of 101 bpm.  Repeat pulse at 98. Encouraged hydration.  Possibly related to nerves/anxiety at first visit. Will check CBC today.   Weakness Unclear etiology.  Depression screen negative.  EKG without worrisome findings.  Will check CBC, CMP,  TSH, Vitamin D. Encourage increased exercise, increased hydration and well-balanced diet. Follow-up will be based on lab results.   Gastroesophageal reflux disease without esophagitis Stable. Prilosec only PRN. Dietary measures discussed.  Follow-up PRN.

## 2014-06-03 NOTE — Progress Notes (Signed)
Pre visit review using our clinic review tool, if applicable. No additional management support is needed unless otherwise documented below in the visit note. 

## 2014-06-03 NOTE — Assessment & Plan Note (Signed)
Stable. Prilosec only PRN. Dietary measures discussed.  Follow-up PRN.

## 2014-06-03 NOTE — Assessment & Plan Note (Signed)
Mild elevation in pulse noted.  EKG reveals sinus tachycardia at rate of 101 bpm.  Repeat pulse at 98. Encouraged hydration.  Possibly related to nerves/anxiety at first visit. Will check CBC today.

## 2014-06-03 NOTE — Patient Instructions (Signed)
Please go to the lab for blood work. I will call you with your results. We will treat based on results.  Stay well hydrated and eat a well-balanced diet.  Try to stay active and exercise some before bed to promote restful sleep.

## 2014-06-04 MED ORDER — CHOLECALCIFEROL 50 MCG (2000 UT) PO TABS: 2000.0000 [IU] | ORAL_TABLET | Freq: Every day | ORAL | Status: DC

## 2014-06-04 NOTE — Addendum Note (Signed)
Addended by: Peggyann Shoals on: 06/04/2014 08:27 AM   Modules accepted: Orders

## 2015-01-21 ENCOUNTER — Encounter: Payer: Self-pay | Admitting: *Deleted

## 2015-02-06 ENCOUNTER — Encounter: Payer: Self-pay | Admitting: Physician Assistant

## 2015-02-06 ENCOUNTER — Ambulatory Visit (INDEPENDENT_AMBULATORY_CARE_PROVIDER_SITE_OTHER): Payer: Medicare Other | Admitting: Physician Assistant

## 2015-02-06 VITALS — BP 140/70 | HR 95 | Temp 98.2°F | Ht 69.0 in | Wt 216.0 lb

## 2015-02-06 DIAGNOSIS — Z0000 Encounter for general adult medical examination without abnormal findings: Secondary | ICD-10-CM

## 2015-02-06 DIAGNOSIS — E669 Obesity, unspecified: Secondary | ICD-10-CM

## 2015-02-06 DIAGNOSIS — Z13220 Encounter for screening for lipoid disorders: Secondary | ICD-10-CM | POA: Diagnosis not present

## 2015-02-06 DIAGNOSIS — Z23 Encounter for immunization: Secondary | ICD-10-CM

## 2015-02-06 DIAGNOSIS — Z125 Encounter for screening for malignant neoplasm of prostate: Secondary | ICD-10-CM

## 2015-02-06 LAB — COMPREHENSIVE METABOLIC PANEL
ALT: 23 U/L (ref 0–53)
AST: 26 U/L (ref 0–37)
Albumin: 3.2 g/dL — ABNORMAL LOW (ref 3.5–5.2)
Alkaline Phosphatase: 68 U/L (ref 39–117)
BUN: 12 mg/dL (ref 6–23)
CHLORIDE: 110 meq/L (ref 96–112)
CO2: 24 meq/L (ref 19–32)
CREATININE: 0.86 mg/dL (ref 0.40–1.50)
Calcium: 8.7 mg/dL (ref 8.4–10.5)
GFR: 92.69 mL/min (ref >60.00)
Glucose, Bld: 293 mg/dL — ABNORMAL HIGH (ref 70–99)
Potassium: 4 mEq/L (ref 3.5–5.1)
SODIUM: 140 meq/L (ref 135–145)
Total Bilirubin: 1.7 mg/dL — ABNORMAL HIGH (ref 0.2–1.2)
Total Protein: 6.3 g/dL (ref 6.0–8.3)

## 2015-02-06 LAB — LIPID PANEL
Cholesterol: 105 mg/dL (ref 0–200)
HDL: 36.1 mg/dL — ABNORMAL LOW (ref >39.00)
LDL CALC: 44 mg/dL (ref 0–99)
NonHDL: 68.49
Total CHOL/HDL Ratio: 3
Triglycerides: 122 mg/dL (ref 0.0–149.0)
VLDL: 24.4 mg/dL (ref 0.0–40.0)

## 2015-02-06 LAB — PSA, MEDICARE: PSA: 0.47 ng/mL (ref 0.10–4.00)

## 2015-02-06 NOTE — Progress Notes (Signed)
Pre visit review using our clinic review tool, if applicable. No additional management support is needed unless otherwise documented below in the visit note. 

## 2015-02-06 NOTE — Progress Notes (Signed)
Subjective:    Steve Watson is a 75 y.o. male who presents for Medicare Annual/Subsequent preventive examination.   Preventive Screening-Counseling & Management  Tobacco History  Smoking status  . Never Smoker   Smokeless tobacco  . Never Used    Comment: NEVER USED TOBACCO    Problems Prior to Visit 1. GERD -- well controlled with occasional OTC Prilosec. Denies breakthrough symptoms.  Current Problems (verified) Patient Active Problem List   Diagnosis Date Noted  . Weakness 06/03/2014  . Tachycardia 06/03/2014  . Gastroesophageal reflux disease without esophagitis 06/03/2014    Medications Prior to Visit Current Outpatient Prescriptions on File Prior to Visit  Medication Sig Dispense Refill  . aspirin 81 MG tablet Take 81 mg by mouth daily.    . calcium & magnesium carbonates (MYLANTA) 311-232 MG per tablet Take 1 tablet by mouth daily.    . Cholecalciferol 2000 UNITS TABS Take 1 tablet (2,000 Units total) by mouth daily at 12 noon. 30 each 2  . omeprazole (PRILOSEC) 10 MG capsule Take 10 mg by mouth as needed.      No current facility-administered medications on file prior to visit.    Current Medications (verified) Current Outpatient Prescriptions  Medication Sig Dispense Refill  . aspirin 81 MG tablet Take 81 mg by mouth daily.    . calcium & magnesium carbonates (MYLANTA) 311-232 MG per tablet Take 1 tablet by mouth daily.    . Cholecalciferol 2000 UNITS TABS Take 1 tablet (2,000 Units total) by mouth daily at 12 noon. 30 each 2  . omeprazole (PRILOSEC) 10 MG capsule Take 10 mg by mouth as needed.      No current facility-administered medications for this visit.     Allergies (verified) Review of patient's allergies indicates no known allergies.   PAST HISTORY  Family History Family History  Problem Relation Age of Onset  . Hypertension Mother   . Hypertension Father   . Hypertension Brother   . Diabetes Brother   . Healthy Child   . Healthy  Grandchild     Social History Social History  Substance Use Topics  . Smoking status: Never Smoker   . Smokeless tobacco: Never Used     Comment: NEVER USED TOBACCO  . Alcohol Use: No    Are there smokers in your home (other than you)?  No  Risk Factors Current exercise habits: Home exercise routine includes calisthenics and treadmill.  Dietary issues discussed: Body mass index is 31.88 kg/(m^2). Endorses well-balanced diet.   Cardiac risk factors: advanced age (older than 95 for men, 77 for women), hypertension, male gender, obesity (BMI >= 30 kg/m2) and sedentary lifestyle.  Depression Screen (Note: if answer to either of the following is "Yes", a more complete depression screening is indicated)   Q1: Over the past two weeks, have you felt down, depressed or hopeless? No  Q2: Over the past two weeks, have you felt little interest or pleasure in doing things? No  Have you lost interest or pleasure in daily life? No  Do you often feel hopeless? No  Do you cry easily over simple problems? No  Activities of Daily Living In your present state of health, do you have any difficulty performing the following activities?:  Driving? No Managing money?  No Feeding yourself? No Getting from bed to chair? No Climbing a flight of stairs? No Preparing food and eating?: No Bathing or showering? No Getting dressed: No Getting to the toilet? No Using the toilet:No Moving  around from place to place: No In the past year have you fallen or had a near fall?:No   Are you sexually active?  Yes  Do you have more than one partner?  No  Hearing Difficulties: No Do you often ask people to speak up or repeat themselves? No Do you experience ringing or noises in your ears? No Do you have difficulty understanding soft or whispered voices? No   Do you feel that you have a problem with memory? No  Do you often misplace items? No  Do you feel safe at home?  Yes  Cognitive Testing  Alert? Yes   Normal Appearance?Yes  Oriented to person? Yes  Place? Yes   Time? Yes  Recall of three objects?  Yes  Can perform simple calculations? Yes  Displays appropriate judgment?Yes  Can read the correct time from a watch face?Yes   Advanced Directives have been discussed with the patient? Yes   List the Names of Other Physician/Practitioners you currently use: 1.    Indicate any recent Medical Services you may have received from other than [facility] providers in the past year (date may be approximate).   There is no immunization history on file for this patient.  Screening Tests Health Maintenance  Topic Date Due  . DTaP/Tdap/Td (1 - Tdap) 03/27/1961  . TETANUS/TDAP  03/27/1961  . COLONOSCOPY  03/27/1992  . ZOSTAVAX  03/28/2002  . PNA vac Low Risk Adult (1 of 2 - PCV13) 03/28/2007  . INFLUENZA VACCINE  04/24/2015 (Originally 08/25/2014)    All answers were reviewed with the patient and necessary referrals were made:  Leeanne Rio, PA-C   02/06/2015   History reviewed: allergies, current medications, past family history, past medical history, past social history, past surgical history and problem list  Review of Systems A comprehensive review of systems was negative.    Objective:     Vision by Snellen chart: right eye:20/60, left eye:20/30 Blood pressure 149/76, pulse 100, temperature 98.2 F (36.8 C), temperature source Oral, height 5' 9"  (1.753 m), weight 216 lb (97.977 kg), SpO2 99 %. Body mass index is 31.88 kg/(m^2).  General appearance: alert, cooperative, appears stated age and no distress Head: Normocephalic, without obvious abnormality, atraumatic Eyes: conjunctivae/corneas clear. PERRL, EOM's intact. Fundi benign. Ears: normal TM's and external ear canals both ears Nose: Nares normal. Septum midline. Mucosa normal. No drainage or sinus tenderness. Throat: lips, mucosa, and tongue normal; teeth and gums normal Lungs: clear to auscultation bilaterally Heart:  regular rate and rhythm, S1, S2 normal, no murmur, click, rub or gallop Abdomen: soft, non-tender; bowel sounds normal; no masses,  no organomegaly Pulses: 2+ and symmetric Lymph nodes: Cervical, supraclavicular, and axillary nodes normal. Neurologic: Alert and oriented X 3, normal strength and tone. Normal symmetric reflexes. Normal coordination and gait     Assessment:     (1) Medicare Wellness, Subsequent     Plan:     During the course of the visit the patient was educated and counseled about appropriate screening and preventive services including:    Pneumococcal vaccine  - Prevnar Given  Colon Cancer Screening -- Declines colonoscopy. Will agree to [employer]. Forms filled out and patient signature obtained.  Will proceed with PSA testing due to patient's good health after options and risks and benefits discussed  Patient Instructions (the written plan) was given to the patient.  Medicare Attestation I have personally reviewed: The patient's medical and social history Their use of alcohol, tobacco or illicit drugs  Their current medications and supplements The patient's functional ability including ADLs,fall risks, home safety risks, cognitive, and hearing and visual impairment Diet and physical activities Evidence for depression or mood disorders  The patient's weight, height, BMI, and visual acuity have been recorded in the chart.  I have made referrals, counseling, and provided education to the patient based on review of the above and I have provided the patient with a written personalized care plan for preventive services.     Steve Watson, [place]   02/06/2015

## 2015-02-06 NOTE — Patient Instructions (Signed)
Please go to the lab for blood work. I will call with your results. Please continue exercise regimen. Try to eat a healthy, well-balanced diet.  We will alter your regimen based on your lab results.  You will be mailed the Cologuard screening kit once we have processed through your insurance. Please follow the directions.  Preventive Care for Adults, Male A healthy lifestyle and preventive care can promote health and wellness. Preventive health guidelines for men include the following key practices:  A routine yearly physical is a good way to check with your health care provider about your health and preventative screening. It is a chance to share any concerns and updates on your health and to receive a thorough exam.  Visit your dentist for a routine exam and preventative care every 6 months. Brush your teeth twice a day and floss once a day. Good oral hygiene prevents tooth decay and gum disease.  The frequency of eye exams is based on your age, health, family medical history, use of contact lenses, and other factors. Follow your health care provider's recommendations for frequency of eye exams.  Eat a healthy diet. Foods such as vegetables, fruits, whole grains, low-fat dairy products, and lean protein foods contain the nutrients you need without too many calories. Decrease your intake of foods high in solid fats, added sugars, and salt. Eat the right amount of calories for you.Get information about a proper diet from your health care provider, if necessary.  Regular physical exercise is one of the most important things you can do for your health. Most adults should get at least 150 minutes of moderate-intensity exercise (any activity that increases your heart rate and causes you to sweat) each week. In addition, most adults need muscle-strengthening exercises on 2 or more days a week.  Maintain a healthy weight. The body mass index (BMI) is a screening tool to identify possible weight  problems. It provides an estimate of body fat based on height and weight. Your health care provider can find your BMI and can help you achieve or maintain a healthy weight.For adults 20 years and older:  A BMI below 18.5 is considered underweight.  A BMI of 18.5 to 24.9 is normal.  A BMI of 25 to 29.9 is considered overweight.  A BMI of 30 and above is considered obese.  Maintain normal blood lipids and cholesterol levels by exercising and minimizing your intake of saturated fat. Eat a balanced diet with plenty of fruit and vegetables. Blood tests for lipids and cholesterol should begin at age 58 and be repeated every 5 years. If your lipid or cholesterol levels are high, you are over 50, or you are at high risk for heart disease, you may need your cholesterol levels checked more frequently.Ongoing high lipid and cholesterol levels should be treated with medicines if diet and exercise are not working.  If you smoke, find out from your health care provider how to quit. If you do not use tobacco, do not start.  Lung cancer screening is recommended for adults aged 14-80 years who are at high risk for developing lung cancer because of a history of smoking. A yearly low-dose CT scan of the lungs is recommended for people who have at least a 30-pack-year history of smoking and are a current smoker or have quit within the past 15 years. A pack year of smoking is smoking an average of 1 pack of cigarettes a day for 1 year (for example: 1 pack a day for  30 years or 2 packs a day for 15 years). Yearly screening should continue until the smoker has stopped smoking for at least 15 years. Yearly screening should be stopped for people who develop a health problem that would prevent them from having lung cancer treatment.  If you choose to drink alcohol, do not have more than 2 drinks per day. One drink is considered to be 12 ounces (355 mL) of beer, 5 ounces (148 mL) of wine, or 1.5 ounces (44 mL) of  liquor.  Avoid use of street drugs. Do not share needles with anyone. Ask for help if you need support or instructions about stopping the use of drugs.  High blood pressure causes heart disease and increases the risk of stroke. Your blood pressure should be checked at least every 1-2 years. Ongoing high blood pressure should be treated with medicines, if weight loss and exercise are not effective.  If you are 80-97 years old, ask your health care provider if you should take aspirin to prevent heart disease.  Diabetes screening is done by taking a blood sample to check your blood glucose level after you have not eaten for a certain period of time (fasting). If you are not overweight and you do not have risk factors for diabetes, you should be screened once every 3 years starting at age 6. If you are overweight or obese and you are 32-14 years of age, you should be screened for diabetes every year as part of your cardiovascular risk assessment.  Colorectal cancer can be detected and often prevented. Most routine colorectal cancer screening begins at the age of 15 and continues through age 53. However, your health care provider may recommend screening at an earlier age if you have risk factors for colon cancer. On a yearly basis, your health care provider may provide home test kits to check for hidden blood in the stool. Use of a small camera at the end of a tube to directly examine the colon (sigmoidoscopy or colonoscopy) can detect the earliest forms of colorectal cancer. Talk to your health care provider about this at age 29, when routine screening begins. Direct exam of the colon should be repeated every 5-10 years through age 51, unless early forms of precancerous polyps or small growths are found.  People who are at an increased risk for hepatitis B should be screened for this virus. You are considered at high risk for hepatitis B if:  You were born in a country where hepatitis B occurs often. Talk  with your health care provider about which countries are considered high risk.  Your parents were born in a high-risk country and you have not received a shot to protect against hepatitis B (hepatitis B vaccine).  You have HIV or AIDS.  You use needles to inject street drugs.  You live with, or have sex with, someone who has hepatitis B.  You are a man who has sex with other men (MSM).  You get hemodialysis treatment.  You take certain medicines for conditions such as cancer, organ transplantation, and autoimmune conditions.  Hepatitis C blood testing is recommended for all people born from [birth year] through 1965 and any individual with known risks for hepatitis C.  Practice safe sex. Use condoms and avoid high-risk sexual practices to reduce the spread of sexually transmitted infections (STIs). STIs include gonorrhea, chlamydia, syphilis, trichomonas, herpes, HPV, and human immunodeficiency virus (HIV). Herpes, HIV, and HPV are viral illnesses that have no cure. They can result in  disability, cancer, and death.  If you are a man who has sex with other men, you should be screened at least once per year for:  HIV.  Urethral, rectal, and pharyngeal infection of gonorrhea, chlamydia, or both.  If you are at risk of being infected with HIV, it is recommended that you take a prescription medicine daily to prevent HIV infection. This is called preexposure prophylaxis (PrEP). You are considered at risk if:  You are a man who has sex with other men (MSM) and have other risk factors.  You are a heterosexual man, are sexually active, and are at increased risk for HIV infection.  You take drugs by injection.  You are sexually active with a partner who has HIV.  Talk with your health care provider about whether you are at high risk of being infected with HIV. If you choose to begin PrEP, you should first be tested for HIV. You should then be tested every 3 months for as long as you are taking  PrEP.  A one-time screening for abdominal aortic aneurysm (AAA) and surgical repair of large AAAs by ultrasound are recommended for men ages 51 to 37 years who are current or former smokers.  Healthy men should no longer receive prostate-specific antigen (PSA) blood tests as part of routine cancer screening. Talk with your health care provider about prostate cancer screening.  Testicular cancer screening is not recommended for adult males who have no symptoms. Screening includes self-exam, a health care provider exam, and other screening tests. Consult with your health care provider about any symptoms you have or any concerns you have about testicular cancer.  Use sunscreen. Apply sunscreen liberally and repeatedly throughout the day. You should seek shade when your shadow is shorter than you. Protect yourself by wearing long sleeves, pants, a wide-brimmed hat, and sunglasses year round, whenever you are outdoors.  Once a month, do a whole-body skin exam, using a mirror to look at the skin on your back. Tell your health care provider about new moles, moles that have irregular borders, moles that are larger than a pencil eraser, or moles that have changed in shape or color.  Stay current with required vaccines (immunizations).  Influenza vaccine. All adults should be immunized every year.  Tetanus, diphtheria, and acellular pertussis (Td, Tdap) vaccine. An adult who has not previously received Tdap or who does not know his vaccine status should receive 1 dose of Tdap. This initial dose should be followed by tetanus and diphtheria toxoids (Td) booster doses every 10 years. Adults with an unknown or incomplete history of completing a 3-dose immunization series with Td-containing vaccines should begin or complete a primary immunization series including a Tdap dose. Adults should receive a Td booster every 10 years.  Varicella vaccine. An adult without evidence of immunity to varicella should receive 2  doses or a second dose if he has previously received 1 dose.  Human papillomavirus (HPV) vaccine. Males aged 11-21 years who have not received the vaccine previously should receive the 3-dose series. Males aged 22-26 years may be immunized. Immunization is recommended through the age of 58 years for any male who has sex with males and did not get any or all doses earlier. Immunization is recommended for any person with an immunocompromised condition through the age of 36 years if he did not get any or all doses earlier. During the 3-dose series, the second dose should be obtained 4-8 weeks after the first dose. The third dose should  be obtained 24 weeks after the first dose and 16 weeks after the second dose.  Zoster vaccine. One dose is recommended for adults aged 47 years or older unless certain conditions are present.  Measles, mumps, and rubella (MMR) vaccine. Adults born before [birth year] generally are considered immune to measles and mumps. Adults born in [birth year] or later should have 1 or more doses of MMR vaccine unless there is a contraindication to the vaccine or there is laboratory evidence of immunity to each of the three diseases. A routine second dose of MMR vaccine should be obtained at least 28 days after the first dose for students attending postsecondary schools, health care workers, or international travelers. People who received inactivated measles vaccine or an unknown type of measles vaccine during 1963-1967 should receive 2 doses of MMR vaccine. People who received inactivated mumps vaccine or an unknown type of mumps vaccine before 1979 and are at high risk for mumps infection should consider immunization with 2 doses of MMR vaccine. Unvaccinated health care workers born before [birth year] who lack laboratory evidence of measles, mumps, or rubella immunity or laboratory confirmation of disease should consider measles and mumps immunization with 2 doses of MMR vaccine or rubella immunization with 1 dose  of MMR vaccine.  Pneumococcal 13-valent conjugate (PCV13) vaccine. When indicated, a person who is uncertain of his immunization history and has no record of immunization should receive the PCV13 vaccine. All adults 76 years of age and older should receive this vaccine. An adult aged 61 years or older who has certain medical conditions and has not been previously immunized should receive 1 dose of PCV13 vaccine. This PCV13 should be followed with a dose of pneumococcal polysaccharide (PPSV23) vaccine. Adults who are at high risk for pneumococcal disease should obtain the PPSV23 vaccine at least 8 weeks after the dose of PCV13 vaccine. Adults older than 73 years of age who have normal immune system function should obtain the PPSV23 vaccine dose at least 1 year after the dose of PCV13 vaccine.  Pneumococcal polysaccharide (PPSV23) vaccine. When PCV13 is also indicated, PCV13 should be obtained first. All adults aged 16 years and older should be immunized. An adult younger than age 87 years who has certain medical conditions should be immunized. Any person who resides in a nursing home or long-term care facility should be immunized. An adult smoker should be immunized. People with an immunocompromised condition and certain other conditions should receive both PCV13 and PPSV23 vaccines. People with human immunodeficiency virus (HIV) infection should be immunized as soon as possible after diagnosis. Immunization during chemotherapy or radiation therapy should be avoided. Routine use of PPSV23 vaccine is not recommended for American Indians, [place] Natives, or people younger than 65 years unless there are medical conditions that require PPSV23 vaccine. When indicated, people who have unknown immunization and have no record of immunization should receive PPSV23 vaccine. One-time revaccination 5 years after the first dose of PPSV23 is recommended for people aged 19-64 years who have chronic kidney failure, nephrotic  syndrome, asplenia, or immunocompromised conditions. People who received 1-2 doses of PPSV23 before age 46 years should receive another dose of PPSV23 vaccine at age 68 years or later if at least 5 years have passed since the previous dose. Doses of PPSV23 are not needed for people immunized with PPSV23 at or after age 45 years.  Meningococcal vaccine. Adults with asplenia or persistent complement component deficiencies should receive 2 doses of quadrivalent meningococcal conjugate (MenACWY-D) vaccine. The doses should be  obtained at least 2 months apart. Microbiologists working with certain meningococcal bacteria, [place] recruits, people at risk during an outbreak, and people who travel to or live in countries with a high rate of meningitis should be immunized. A first-year college student up through age 72 years who is living in a residence hall should receive a dose if he did not receive a dose on or after his 16th birthday. Adults who have certain high-risk conditions should receive one or more doses of vaccine.  Hepatitis A vaccine. Adults who wish to be protected from this disease, have chronic liver disease, work with hepatitis A-infected animals, work in hepatitis A research labs, or travel to or work in countries with a high rate of hepatitis A should be immunized. Adults who were previously unvaccinated and who anticipate close contact with an international adoptee during the first 60 days after arrival in the [place] from a country with a high rate of hepatitis A should be immunized.  Hepatitis B vaccine. Adults should be immunized if they wish to be protected from this disease, are under age 85 years and have diabetes, have chronic liver disease, have had more than one sex partner in the past 6 months, may be exposed to blood or other infectious body fluids, are household contacts or sex partners of hepatitis B positive people, are clients or workers in certain care facilities, or travel to  or work in countries with a high rate of hepatitis B.  Haemophilus influenzae type b (Hib) vaccine. A previously unvaccinated person with asplenia or sickle cell disease or having a scheduled splenectomy should receive 1 dose of Hib vaccine. Regardless of previous immunization, a recipient of a hematopoietic stem cell transplant should receive a 3-dose series 6-12 months after his successful transplant. Hib vaccine is not recommended for adults with HIV infection. Preventive Service / Frequency Ages 35 to 69  Blood pressure check.** / Every 3-5 years.  Lipid and cholesterol check.** / Every 5 years beginning at age 66.  Hepatitis C blood test.** / For any individual with known risks for hepatitis C.  Skin self-exam. / Monthly.  Influenza vaccine. / Every year.  Tetanus, diphtheria, and acellular pertussis (Tdap, Td) vaccine.** / Consult your health care provider. 1 dose of Td every 10 years.  Varicella vaccine.** / Consult your health care provider.  HPV vaccine. / 3 doses over 6 months, if 36 or younger.  Measles, mumps, rubella (MMR) vaccine.** / You need at least 1 dose of MMR if you were born in 1957 or later. You may also need a second dose.  Pneumococcal 13-valent conjugate (PCV13) vaccine.** / Consult your health care provider.  Pneumococcal polysaccharide (PPSV23) vaccine.** / 1 to 2 doses if you smoke cigarettes or if you have certain conditions.  Meningococcal vaccine.** / 1 dose if you are age 96 to 85 years and a Market researcher living in a residence hall, or have one of several medical conditions. You may also need additional booster doses.  Hepatitis A vaccine.** / Consult your health care provider.  Hepatitis B vaccine.** / Consult your health care provider.  Haemophilus influenzae type b (Hib) vaccine.** / Consult your health care provider. Ages 37 to 107  Blood pressure check.** / Every year.  Lipid and cholesterol check.** / Every 5 years beginning  at age 45.  Lung cancer screening. / Every year if you are aged 75-80 years and have a 30-pack-year history of smoking and currently smoke or have quit within the  past 15 years. Yearly screening is stopped once you have quit smoking for at least 15 years or develop a health problem that would prevent you from having lung cancer treatment.  Fecal occult blood test (FOBT) of stool. / Every year beginning at age 98 and continuing until age 87. You may not have to do this test if you get a colonoscopy every 10 years.  Flexible sigmoidoscopy** or colonoscopy.** / Every 5 years for a flexible sigmoidoscopy or every 10 years for a colonoscopy beginning at age 57 and continuing until age 45.  Hepatitis C blood test.** / For all people born from [birth year] through 1965 and any individual with known risks for hepatitis C.  Skin self-exam. / Monthly.  Influenza vaccine. / Every year.  Tetanus, diphtheria, and acellular pertussis (Tdap/Td) vaccine.** / Consult your health care provider. 1 dose of Td every 10 years.  Varicella vaccine.** / Consult your health care provider.  Zoster vaccine.** / 1 dose for adults aged 72 years or older.  Measles, mumps, rubella (MMR) vaccine.** / You need at least 1 dose of MMR if you were born in 1955 or later. You may also need a second dose.  Pneumococcal 13-valent conjugate (PCV13) vaccine.** / Consult your health care provider.  Pneumococcal polysaccharide (PPSV23) vaccine.** / 1 to 2 doses if you smoke cigarettes or if you have certain conditions.  Meningococcal vaccine.** / Consult your health care provider.  Hepatitis A vaccine.** / Consult your health care provider.  Hepatitis B vaccine.** / Consult your health care provider.  Haemophilus influenzae type b (Hib) vaccine.** / Consult your health care provider. Ages 38 and over  Blood pressure check.** / Every year.  Lipid and cholesterol check.**/ Every 5 years beginning at age 63.  Lung cancer screening.  / Every year if you are aged 81-80 years and have a 30-pack-year history of smoking and currently smoke or have quit within the past 15 years. Yearly screening is stopped once you have quit smoking for at least 15 years or develop a health problem that would prevent you from having lung cancer treatment.  Fecal occult blood test (FOBT) of stool. / Every year beginning at age 62 and continuing until age 44. You may not have to do this test if you get a colonoscopy every 10 years.  Flexible sigmoidoscopy** or colonoscopy.** / Every 5 years for a flexible sigmoidoscopy or every 10 years for a colonoscopy beginning at age 40 and continuing until age 44.  Hepatitis C blood test.** / For all people born from [birth year] through 1963 and any individual with known risks for hepatitis C.  Abdominal aortic aneurysm (AAA) screening.** / A one-time screening for ages 17 to 38 years who are current or former smokers.  Skin self-exam. / Monthly.  Influenza vaccine. / Every year.  Tetanus, diphtheria, and acellular pertussis (Tdap/Td) vaccine.** / 1 dose of Td every 10 years.  Varicella vaccine.** / Consult your health care provider.  Zoster vaccine.** / 1 dose for adults aged 68 years or older.  Pneumococcal 13-valent conjugate (PCV13) vaccine.** / 1 dose for all adults aged 39 years and older.  Pneumococcal polysaccharide (PPSV23) vaccine.** / 1 dose for all adults aged 30 years and older.  Meningococcal vaccine.** / Consult your health care provider.  Hepatitis A vaccine.** / Consult your health care provider.  Hepatitis B vaccine.** / Consult your health care provider.  Haemophilus influenzae type b (Hib) vaccine.** / Consult your health care provider. **Family history and personal history of  risk and conditions may change your health care provider's recommendations.   This information is not intended to replace advice given to you by your health care provider. Make sure you discuss any questions you  have with your health care provider.   Document Released: 03/08/2001 Document Revised: 01/31/2014 Document Reviewed: 06/07/2010 Elsevier Interactive Patient Education [employer].

## 2015-02-10 ENCOUNTER — Telehealth: Payer: Self-pay | Admitting: *Deleted

## 2015-02-10 DIAGNOSIS — R739 Hyperglycemia, unspecified: Secondary | ICD-10-CM

## 2015-02-10 NOTE — Telephone Encounter (Signed)
Called and spoke with the pt and informed him of recent lab results and note.  Pt verbalized understanding and agreed.  Pt was scheduled for a lab appt on (Thurs. 02-12-15 @ 7:15am).  Future lab ordered and sent.//AB/CMA

## 2015-02-10 NOTE — Telephone Encounter (Signed)
-----   Message from Brunetta Jeans, PA-C sent at 02/09/2015 10:06 AM EST ----- Labs look great overall. Sugar (even non-fasting was elevated). I would like him to schedule lab appointment for A1C to assess for diabetes giving such an elevation in sugars. Will call once resulted.

## 2015-02-12 ENCOUNTER — Other Ambulatory Visit (INDEPENDENT_AMBULATORY_CARE_PROVIDER_SITE_OTHER): Payer: Medicare Other

## 2015-02-12 DIAGNOSIS — R739 Hyperglycemia, unspecified: Secondary | ICD-10-CM

## 2015-02-12 DIAGNOSIS — R7309 Other abnormal glucose: Secondary | ICD-10-CM | POA: Diagnosis not present

## 2015-02-12 LAB — HEMOGLOBIN A1C: Hgb A1c MFr Bld: 6.1 % (ref 4.6–6.5)

## 2015-02-13 ENCOUNTER — Encounter: Payer: Self-pay | Admitting: *Deleted

## 2015-03-03 DIAGNOSIS — Z1212 Encounter for screening for malignant neoplasm of rectum: Secondary | ICD-10-CM | POA: Diagnosis not present

## 2015-03-03 DIAGNOSIS — Z1211 Encounter for screening for malignant neoplasm of colon: Secondary | ICD-10-CM | POA: Diagnosis not present

## 2015-03-05 LAB — COLOGUARD: Cologuard: NEGATIVE

## 2015-07-30 ENCOUNTER — Ambulatory Visit: Payer: Medicare Other | Admitting: Medical

## 2015-07-30 ENCOUNTER — Encounter: Payer: Self-pay | Admitting: Medical

## 2015-07-30 ENCOUNTER — Ambulatory Visit (INDEPENDENT_AMBULATORY_CARE_PROVIDER_SITE_OTHER): Payer: Medicare Other | Admitting: Medical

## 2015-07-30 VITALS — BP 120/80 | HR 99 | Temp 98.1°F | Ht 69.0 in | Wt 183.8 lb

## 2015-07-30 DIAGNOSIS — R5383 Other fatigue: Secondary | ICD-10-CM | POA: Diagnosis not present

## 2015-07-30 DIAGNOSIS — J0100 Acute maxillary sinusitis, unspecified: Secondary | ICD-10-CM | POA: Diagnosis not present

## 2015-07-30 DIAGNOSIS — R739 Hyperglycemia, unspecified: Secondary | ICD-10-CM

## 2015-07-30 DIAGNOSIS — J309 Allergic rhinitis, unspecified: Secondary | ICD-10-CM | POA: Diagnosis not present

## 2015-07-30 MED ORDER — DOXYCYCLINE HYCLATE 100 MG PO TABS: 100.0000 mg | ORAL_TABLET | Freq: Two times a day (BID) | ORAL | Status: DC

## 2015-07-30 MED ORDER — FLUTICASONE PROPIONATE 50 MCG/ACT NA SUSP: 2.0000 | Freq: Every day | NASAL | Status: DC

## 2015-07-30 NOTE — Patient Instructions (Signed)
For nasal congestion and sinus infection will rx flonase and doxycycline antibiotic.  For fatigue will get cbc,cmp and tsh. For hyperglycemia history will get a1c.   Your neurologic exam was good today. If you get any deficts or changes as discussed then ED evalaution.  Follow up in 7-10 days or as needed

## 2015-07-30 NOTE — Progress Notes (Signed)
Subjective:    Patient ID: Steve Watson, male    DOB: Jul 15, 1940, 75 y.o.   MRN: [id number]  HPI  Pt in for feeling fatigued and nasal congested for 4 wks. Pt states when blows his nose will get yellow mucous. He states he is sleeping more. No fever, no chills or sweats. No sneezing. No eye itching.  Pt states walking little slower over past month. On review no gross motor or sensory function deficits.  Pt in prediabetic range in past. Since he was told that he has been eating less carbs.      Review of Systems  Constitutional: Positive for fatigue. Negative for fever and chills.  HENT: Positive for congestion, postnasal drip and sinus pressure. Negative for ear discharge, ear pain, mouth sores and sore throat.        Can blow out mucous from nose a times. When blows nose at times feels swimmy headed.  Respiratory: Negative for cough, choking and wheezing.   Cardiovascular: Negative for chest pain and palpitations.  Gastrointestinal: Negative for nausea, vomiting, abdominal pain, diarrhea and blood in stool.  Genitourinary: Negative for dysuria, urgency, frequency, flank pain, scrotal swelling and penile pain.  Musculoskeletal: Negative for gait problem.  Skin: Negative for rash.  Hematological: Negative for adenopathy. Does not bruise/bleed easily.  Psychiatric/Behavioral: Negative for behavioral problems and confusion.   Past Medical History  Diagnosis Date  . GERD (gastroesophageal reflux disease)   . Chicken pox      Social History   Social History  . Marital Status: Married    Spouse Name: N/A  . Number of Children: N/A  . Years of Education: N/A   Occupational History  . Not on file.   Social History Main Topics  . Smoking status: Never Smoker   . Smokeless tobacco: Never Used     Comment: NEVER USED TOBACCO  . Alcohol Use: No  . Drug Use: No  . Sexual Activity: Yes   Other Topics Concern  . Not on file   Social History Narrative    Past Surgical  History  Procedure Laterality Date  . No past surgeries      Family History  Problem Relation Age of Onset  . Hypertension Mother   . Hypertension Father   . Hypertension Brother   . Diabetes Brother   . Healthy Child   . Healthy Grandchild     No Known Allergies  Current Outpatient Prescriptions on File Prior to Visit  Medication Sig Dispense Refill  . aspirin 81 MG tablet Take 81 mg by mouth daily.    . calcium & magnesium carbonates (MYLANTA) 311-232 MG per tablet Take 1 tablet by mouth daily.    . Cholecalciferol 2000 UNITS TABS Take 1 tablet (2,000 Units total) by mouth daily at 12 noon. 30 each 2  . omeprazole (PRILOSEC) 10 MG capsule Take 10 mg by mouth as needed.      No current facility-administered medications on file prior to visit.    BP 120/80 mmHg  Pulse 99  Temp(Src) 98.1 F (36.7 C) (Oral)  Ht 5' 9"  (1.753 m)  Wt 183 lb 12.8 oz (83.371 kg)  BMI 27.13 kg/m2  SpO2 98%       Objective:   Physical Exam  General  Mental Status - Alert. General Appearance - Well groomed. Not in acute distress.  Skin Rashes- No Rashes.  HEENT Head- Normal. Ear Auditory Canal - Left- Normal. Right - Normal.Tympanic Membrane- Left- Normal. Right- Normal. Eye  Sclera/Conjunctiva- Left- Normal. Right- Normal. Nose & Sinuses Nasal Mucosa- Left-  Boggy and Congested. Right-  Boggy and  Congested.Bilateral maxillary and frontal sinus pressure. Mouth & Throat Lips: Upper Lip- Normal: no dryness, cracking, pallor, cyanosis, or vesicular eruption. Lower Lip-Normal: no dryness, cracking, pallor, cyanosis or vesicular eruption. Buccal Mucosa- Bilateral- No Aphthous ulcers. Oropharynx- No Discharge or Erythema. +pnd Tonsils: Characteristics- Bilateral- No Erythema or Congestion. Size/Enlargement- Bilateral- No enlargement. Discharge- bilateral-None.  Neck Neck- Supple. No Masses.   Chest and Lung Exam Auscultation: Breath Sounds:-Clear even and  unlabored.  Cardiovascular Auscultation:Rythm- Regular, rate and rhythm. Murmurs & Other Heart Sounds:Ausculatation of the heart reveal- No Murmurs.  Lymphatic Head & Neck General Head & Neck Lymphatics: Bilateral: Description- No Localized lymphadenopathy.  Neurologic Cranial Nerve exam:- CN III-XII intact(No nystagmus), symmetric smile. Drift Test:- No drift. Romberg Exam:- Negative.  Heal to Toe Gait exam:-Normal. Finger to Nose:- Normal/Intact Strength:- 5/5 equal and symmetric strength both upper and lower extremities. I watched him walk and appears to have steady gait.       Assessment & Plan:  For nasal congestion and sinus infection will rx flonase and doxycycline antibiotic.  For fatigue will get cbc,cmp and tsh. For hyperglycemia history will get a1c.   Your neurologic exam was good today. If you get any deficts or changes as discussed then ED evalaution.  Follow up in 7-10 days or as needed  Ricahrd Schwager, Percell Miller, [employer]

## 2015-07-30 NOTE — Progress Notes (Signed)
Pre visit review using our clinic review tool, if applicable. No additional management support is needed unless otherwise documented below in the visit note. 

## 2015-07-31 LAB — CBC WITH DIFFERENTIAL/PLATELET
BASOS ABS: 0 10*3/uL (ref 0.0–0.1)
Basophils Relative: 0.7 % (ref 0.0–3.0)
EOS ABS: 0.1 10*3/uL (ref 0.0–0.7)
EOS PCT: 2.7 % (ref 0.0–5.0)
HCT: 41.5 % (ref 39.0–52.0)
HEMOGLOBIN: 14.1 g/dL (ref 13.0–17.0)
LYMPHS ABS: 1.2 10*3/uL (ref 0.7–4.0)
Lymphocytes Relative: 36.7 % (ref 12.0–46.0)
MCHC: 34 g/dL (ref 30.0–36.0)
MCV: 96.9 fl (ref 78.0–100.0)
MONO ABS: 0.3 10*3/uL (ref 0.1–1.0)
Monocytes Relative: 9 % (ref 3.0–12.0)
NEUTROS PCT: 50.9 % (ref 43.0–77.0)
Neutro Abs: 1.7 10*3/uL (ref 1.4–7.7)
Platelets: 65 10*3/uL — ABNORMAL LOW (ref 150.0–400.0)
RBC: 4.28 Mil/uL (ref 4.22–5.81)
RDW: 15.3 % (ref 11.5–15.5)
WBC: 3.3 10*3/uL — AB (ref 4.0–10.5)

## 2015-07-31 LAB — COMPREHENSIVE METABOLIC PANEL
ALBUMIN: 3.5 g/dL (ref 3.5–5.2)
ALK PHOS: 62 U/L (ref 39–117)
ALT: 24 U/L (ref 0–53)
AST: 28 U/L (ref 0–37)
BUN: 14 mg/dL (ref 6–23)
CO2: 23 mEq/L (ref 19–32)
CREATININE: 0.77 mg/dL (ref 0.40–1.50)
Calcium: 9.3 mg/dL (ref 8.4–10.5)
Chloride: 113 mEq/L — ABNORMAL HIGH (ref 96–112)
GFR: 105.16 mL/min (ref >60.00)
GLUCOSE: 133 mg/dL — AB (ref 70–99)
POTASSIUM: 3.9 meq/L (ref 3.5–5.1)
SODIUM: 141 meq/L (ref 135–145)
TOTAL PROTEIN: 6.5 g/dL (ref 6.0–8.3)
Total Bilirubin: 0.9 mg/dL (ref 0.2–1.2)

## 2015-07-31 LAB — HEMOGLOBIN A1C: Hgb A1c MFr Bld: 4.8 % (ref 4.6–6.5)

## 2015-07-31 LAB — TSH: TSH: 1.23 u[IU]/mL (ref 0.35–4.50)

## 2015-08-01 ENCOUNTER — Telehealth: Payer: Self-pay | Admitting: Medical

## 2015-08-01 DIAGNOSIS — D696 Thrombocytopenia, unspecified: Secondary | ICD-10-CM

## 2015-08-01 DIAGNOSIS — D72819 Decreased white blood cell count, unspecified: Secondary | ICD-10-CM

## 2015-08-01 NOTE — Telephone Encounter (Signed)
Future cbc placed.

## 2015-08-10 ENCOUNTER — Ambulatory Visit (INDEPENDENT_AMBULATORY_CARE_PROVIDER_SITE_OTHER): Payer: Medicare Other | Admitting: Physician Assistant

## 2015-08-10 ENCOUNTER — Encounter: Payer: Self-pay | Admitting: Physician Assistant

## 2015-08-10 VITALS — BP 118/74 | HR 77 | Temp 97.8°F | Resp 16 | Ht 69.0 in | Wt 187.0 lb

## 2015-08-10 DIAGNOSIS — B9689 Other specified bacterial agents as the cause of diseases classified elsewhere: Secondary | ICD-10-CM

## 2015-08-10 DIAGNOSIS — D696 Thrombocytopenia, unspecified: Secondary | ICD-10-CM | POA: Diagnosis not present

## 2015-08-10 DIAGNOSIS — J019 Acute sinusitis, unspecified: Secondary | ICD-10-CM

## 2015-08-10 NOTE — Progress Notes (Signed)
Patient presents to clinic today for follow-up of acute bacterial sinusitis, fatigue and chronic thrombocytopenia.  Patient endorses taking antibiotics as directed, completing course. Endorses resolution of sinus pressure, sinus pain and other URI symptoms. Endorses energy level has improved since that time. Notes Flonase helped a lot with nasal symptoms.  Patient was noted to have platelet level at 65,000. His baseline range is 80-100,000. Denies easy bruising or bleeding. Denies blood in stool. Has not heard from Hematology regarding appointment.   Past Medical History  Diagnosis Date  . GERD (gastroesophageal reflux disease)   . Chicken pox     Current Outpatient Prescriptions on File Prior to Visit  Medication Sig Dispense Refill  . aspirin 81 MG tablet Take 81 mg by mouth daily.    . calcium & magnesium carbonates (MYLANTA) 311-232 MG per tablet Take 1 tablet by mouth daily.    . Cholecalciferol 2000 UNITS TABS Take 1 tablet (2,000 Units total) by mouth daily at 12 noon. 30 each 2  . fluticasone (FLONASE) 50 MCG/ACT nasal spray Place 2 sprays into both nostrils daily. 16 g 1  . omeprazole (PRILOSEC) 10 MG capsule Take 10 mg by mouth as needed.      No current facility-administered medications on file prior to visit.    No Known Allergies  Family History  Problem Relation Age of Onset  . Hypertension Mother   . Hypertension Father   . Hypertension Brother   . Diabetes Brother   . Healthy Child   . Healthy Grandchild     Social History   Social History  . Marital Status: Married    Spouse Name: N/A  . Number of Children: N/A  . Years of Education: N/A   Social History Main Topics  . Smoking status: Never Smoker   . Smokeless tobacco: Never Used     Comment: NEVER USED TOBACCO  . Alcohol Use: No  . Drug Use: No  . Sexual Activity: Yes   Other Topics Concern  . None   Social History Narrative    Review of Systems - See HPI.  All other ROS are  negative.  BP 118/74 mmHg  Pulse 77  Temp(Src) 97.8 F (36.6 C) (Oral)  Resp 16  Ht 5' 9"  (1.753 m)  Wt 187 lb (84.823 kg)  BMI 27.60 kg/m2  SpO2 98%  Physical Exam  Constitutional: He is oriented to person, place, and time and well-developed, well-nourished, and in no distress.  HENT:  Head: Normocephalic and atraumatic.  Right Ear: External ear normal.  Left Ear: External ear normal.  Nose: Nose normal.  Mouth/Throat: Oropharynx is clear and moist. No oropharyngeal exudate.  TM within normal limits bilaterally  Eyes: Conjunctivae are normal.  Neck: Neck supple.  Cardiovascular: Normal rate, regular rhythm, normal heart sounds and intact distal pulses.   Pulmonary/Chest: Effort normal and breath sounds normal. No respiratory distress. He has no wheezes. He has no rales. He exhibits no tenderness.  Neurological: He is alert and oriented to person, place, and time.  Skin: Skin is warm and dry. No rash noted.  Psychiatric: Affect normal.  Vitals reviewed.   Recent Results (from the past 2160 hour(s))  CBC w/Diff     Status: Abnormal   Collection Time: 07/30/15  5:34 PM  Result Value Ref Range   WBC 3.3 (L) 4.0 - 10.5 K/uL   RBC 4.28 4.22 - 5.81 Mil/uL   Hemoglobin 14.1 13.0 - 17.0 g/dL   HCT 41.5 39.0 -  52.0 %   MCV 96.9 78.0 - 100.0 fl   MCHC 34.0 30.0 - 36.0 g/dL   RDW 15.3 11.5 - 15.5 %   Platelets 65.0 (L) 150.0 - 400.0 K/uL   Neutrophils Relative % 50.9 43.0 - 77.0 %   Lymphocytes Relative 36.7 12.0 - 46.0 %   Monocytes Relative 9.0 3.0 - 12.0 %   Eosinophils Relative 2.7 0.0 - 5.0 %   Basophils Relative 0.7 0.0 - 3.0 %   Neutro Abs 1.7 1.4 - 7.7 K/uL   Lymphs Abs 1.2 0.7 - 4.0 K/uL   Monocytes Absolute 0.3 0.1 - 1.0 K/uL   Eosinophils Absolute 0.1 0.0 - 0.7 K/uL   Basophils Absolute 0.0 0.0 - 0.1 K/uL  Comprehensive metabolic panel     Status: Abnormal   Collection Time: 07/30/15  5:34 PM  Result Value Ref Range   Sodium 141 135 - 145 mEq/L   Potassium 3.9  3.5 - 5.1 mEq/L   Chloride 113 (H) 96 - 112 mEq/L   CO2 23 19 - 32 mEq/L   Glucose, Bld 133 (H) 70 - 99 mg/dL   BUN 14 6 - 23 mg/dL   Creatinine, Ser 0.77 0.40 - 1.50 mg/dL   Total Bilirubin 0.9 0.2 - 1.2 mg/dL   Alkaline Phosphatase 62 39 - 117 U/L   AST 28 0 - 37 U/L   ALT 24 0 - 53 U/L   Total Protein 6.5 6.0 - 8.3 g/dL   Albumin 3.5 3.5 - 5.2 g/dL   Calcium 9.3 8.4 - 10.5 mg/dL   GFR 105.16 >60.00 mL/min  TSH     Status: None   Collection Time: 07/30/15  5:34 PM  Result Value Ref Range   TSH 1.23 0.35 - 4.50 uIU/mL  Hemoglobin A1c     Status: None   Collection Time: 07/30/15  5:34 PM  Result Value Ref Range   Hgb A1c MFr Bld 4.8 4.6 - 6.5 %    Comment: Glycemic Control Guidelines for People with Diabetes:Non Diabetic:  <6%Goal of Therapy: <7%Additional Action Suggested:  >8%     Assessment/Plan: 1. Thrombocytopenia (HCC) Chronic with decline in baseline platelet counts. Will repeat CBC today. I do not see where referral to hematology was placed. Will place today. Patient to contact us if he has not heard from them within 48 hours. Alarm signs/symptoms reviewed with patient.  2. Acute bacterial sinusitis Resolved. Continue supportive measures.    Leeanne Rio, PA-C

## 2015-08-10 NOTE — Patient Instructions (Signed)
Please go to the lab for repeat blood work. I will call you with your results.  Please stay well hydrated. Continue chronic medications.  You will be contacted by Dr. Antonieta Pert office for an appointment. If you do not hear from them by Wednesday or Thursday, please call me.   If you note any recurrence of fatigue with or without any abnormal bleeding or bruising, please come see [place] immediately. If there is anything significant, please call 911.

## 2015-08-11 LAB — CBC WITH DIFFERENTIAL/PLATELET
BASOS ABS: 0.1 10*3/uL (ref 0.0–0.1)
BASOS PCT: 1.6 % (ref 0.0–3.0)
EOS PCT: 3.1 % (ref 0.0–5.0)
Eosinophils Absolute: 0.1 10*3/uL (ref 0.0–0.7)
HEMATOCRIT: 38.3 % — AB (ref 39.0–52.0)
Hemoglobin: 13.1 g/dL (ref 13.0–17.0)
LYMPHS ABS: 1.4 10*3/uL (ref 0.7–4.0)
LYMPHS PCT: 35.9 % (ref 12.0–46.0)
MCHC: 34.3 g/dL (ref 30.0–36.0)
MCV: 96.9 fl (ref 78.0–100.0)
MONOS PCT: 11 % (ref 3.0–12.0)
Monocytes Absolute: 0.4 10*3/uL (ref 0.1–1.0)
NEUTROS ABS: 1.9 10*3/uL (ref 1.4–7.7)
NEUTROS PCT: 48.4 % (ref 43.0–77.0)
PLATELETS: 78 10*3/uL — AB (ref 150.0–400.0)
RBC: 3.95 Mil/uL — ABNORMAL LOW (ref 4.22–5.81)
RDW: 15.3 % (ref 11.5–15.5)
WBC: 3.9 10*3/uL — ABNORMAL LOW (ref 4.0–10.5)

## 2015-08-14 ENCOUNTER — Other Ambulatory Visit: Payer: Self-pay

## 2015-08-14 DIAGNOSIS — D696 Thrombocytopenia, unspecified: Secondary | ICD-10-CM

## 2015-08-17 ENCOUNTER — Encounter: Payer: Self-pay | Admitting: Hematology & Oncology

## 2015-08-17 ENCOUNTER — Ambulatory Visit (HOSPITAL_BASED_OUTPATIENT_CLINIC_OR_DEPARTMENT_OTHER)
Admission: RE | Admit: 2015-08-17 | Discharge: 2015-08-17 | Disposition: A | Discharge status: Home / Self Care | Payer: Medicare Other | Source: Ambulatory Visit | Attending: Hematology & Oncology | Admitting: Hematology & Oncology

## 2015-08-17 ENCOUNTER — Ambulatory Visit (HOSPITAL_BASED_OUTPATIENT_CLINIC_OR_DEPARTMENT_OTHER): Payer: Medicare Other | Admitting: Hematology & Oncology

## 2015-08-17 ENCOUNTER — Other Ambulatory Visit (HOSPITAL_BASED_OUTPATIENT_CLINIC_OR_DEPARTMENT_OTHER): Payer: Medicare Other

## 2015-08-17 VITALS — BP 127/68 | HR 72 | Temp 98.1°F | Resp 16 | Ht 69.0 in | Wt 187.0 lb

## 2015-08-17 DIAGNOSIS — D696 Thrombocytopenia, unspecified: Secondary | ICD-10-CM

## 2015-08-17 DIAGNOSIS — R935 Abnormal findings on diagnostic imaging of other abdominal regions, including retroperitoneum: Secondary | ICD-10-CM | POA: Diagnosis not present

## 2015-08-17 DIAGNOSIS — D72819 Decreased white blood cell count, unspecified: Secondary | ICD-10-CM | POA: Diagnosis not present

## 2015-08-17 DIAGNOSIS — R932 Abnormal findings on diagnostic imaging of liver and biliary tract: Secondary | ICD-10-CM | POA: Diagnosis not present

## 2015-08-17 DIAGNOSIS — D7381 Neutropenic splenomegaly: Secondary | ICD-10-CM

## 2015-08-17 HISTORY — DX: Neutropenic splenomegaly: D73.81

## 2015-08-17 HISTORY — DX: Decreased white blood cell count, unspecified: D72.819

## 2015-08-17 LAB — CBC WITH DIFFERENTIAL (CANCER CENTER ONLY)
BASO#: 0 10*3/uL (ref 0.0–0.2)
BASO%: 1.1 % (ref 0.0–2.0)
EOS ABS: 0.1 10*3/uL (ref 0.0–0.5)
EOS%: 2.6 % (ref 0.0–7.0)
HCT: 40.6 % (ref 38.7–49.9)
HEMOGLOBIN: 14 g/dL (ref 13.0–17.1)
LYMPH#: 1.6 10*3/uL (ref 0.9–3.3)
LYMPH%: 45.4 % (ref 14.0–48.0)
MCH: 33.7 pg — AB (ref 28.0–33.4)
MCHC: 34.5 g/dL (ref 32.0–35.9)
MCV: 98 fL (ref 82–98)
MONO#: 0.4 10*3/uL (ref 0.1–0.9)
MONO%: 10 % (ref 0.0–13.0)
NEUT%: 40.9 % (ref 40.0–80.0)
NEUTROS ABS: 1.4 10*3/uL — AB (ref 1.5–6.5)
RBC: 4.16 10*6/uL — AB (ref 4.20–5.70)
RDW: 14 % (ref 11.1–15.7)
WBC: 3.5 10*3/uL — AB (ref 4.0–10.0)

## 2015-08-17 LAB — CHCC SATELLITE - SMEAR

## 2015-08-17 NOTE — Progress Notes (Signed)
Hematology and Oncology Follow Up Visit  Steve Watson [id number] 07-16-1940 75 y.o. 08/17/2015   Principle Diagnosis:   Leukopenia and Thrombocytopenia  Current Therapy:    Observation     Interim History:  [name] is back for a long awaited follow-up. We only saw him once back in June 2015.  Since then, he's been seen by his family doctor. He's been noted to have a drop in his white cell count and platelet count. He has had no problems with bleeding or bruising. He has had no issues with weight loss or weight gain. He has had no rashes.  Back in May 2016, his white cell count was 3.6. Hemoglobin 14.5 and plate count was 46,568.  In July of this year, his white cell count 3.9. Hemoglobin 13.1. Platelet count 78,000.  Again, he has had no issues with change in medications. He has had no fever. He has had no infections. He has had no joint problems.  He does not smoke. He really does not drink.  Overall, his performance status is ECOG 1.   Medications:  Current Outpatient Prescriptions:  .  aspirin 81 MG tablet, Take 81 mg by mouth daily., Disp: , Rfl:  .  calcium & magnesium carbonates (MYLANTA) 311-232 MG per tablet, Take 1 tablet by mouth daily., Disp: , Rfl:  .  Cholecalciferol 2000 UNITS TABS, Take 1 tablet (2,000 Units total) by mouth daily at 12 noon., Disp: 30 each, Rfl: 2 .  omeprazole (PRILOSEC) 10 MG capsule, Take 10 mg by mouth as needed. , Disp: , Rfl:   Allergies: No Known Allergies  Past Medical History, Surgical history, Social history, and Family History were reviewed and updated.  Review of Systems: As above  Physical Exam:  height is 5' 9"  (1.753 m) and weight is 187 lb (84.8 kg). His oral temperature is 98.1 F (36.7 C). His blood pressure is 127/68 and his pulse is 72. His respiration is 16.   Wt Readings from Last 3 Encounters:  08/17/15 187 lb (84.8 kg)  08/10/15 187 lb (84.8 kg)  07/30/15 183 lb 12.8 oz (83.4 kg)     Well-developed  and well-nourished white male in no obvious distress. Head and neck exam shows no ocular or oral lesions. He has no palpable cervical or supraclavicular lymph nodes. Lungs are clear to percussion and auscultation bilaterally. Cardiac exam regular rate and rhythm with no murmurs, rubs or bruits. Abdomen is soft. He has decent bowel sounds. There is no fluid wave. There is no palpable liver tip. Spleen tip might be palpable with inspiration. Back exam shows no tenderness over the spine, ribs or hips. Extremities shows no clubbing, cyanosis or edema. Neurological exam shows no focal neurological deficit. Skin exam shows some rare scattered ecchymoses.  Lab Results  Component Value Date   WBC 3.5 (L) 08/17/2015   HGB 14.0 08/17/2015   HCT 40.6 08/17/2015   MCV 98 08/17/2015   PLT 67 Platelet count consistent in citrate (L) 08/17/2015     Chemistry      Component Value Date/Time   NA 141 07/30/2015 1734   K 3.9 07/30/2015 1734   CL 113 (H) 07/30/2015 1734   CO2 23 07/30/2015 1734   BUN 14 07/30/2015 1734   CREATININE 0.77 07/30/2015 1734      Component Value Date/Time   CALCIUM 9.3 07/30/2015 1734   ALKPHOS 62 07/30/2015 1734   AST 28 07/30/2015 1734   ALT 24 07/30/2015 1734   BILITOT 0.9  07/30/2015 1734      On his blood, he has a normochromic and normocytic population of red blood cells. He has no nucleated red blood cells. He has no schistocytes or spherocytes. There is no target cells. He has no rouleau formation. White cells are slightly decreased in number. She has good maturation of his white blood cells. He has no hypersegmented polys. He has no immature myeloid or lymphoid forms. Platelets are decreased in number. Platelets are small. Platelets are well granulated.  Impression and Plan: Steve Watson is a 75 year old white male. It has been 2 years since we last saw him. In reality, his blood counts are probably the same. I really don't see much difference.  I did get a  ultrasound of his abdomen. This did show some "liver disease". I'm not sure what the radiologist means by this. Maybe this indicates some kind of cirrhosis. There was no splenomegaly. There is no lymphadenopathy.  Given that things have been stable for a couple years, I think we can still hold off on doing anything invasive like a bone marrow test. I spent team may need one at some point. He is totally asymptomatic right now.  I will like to get him back to see me in another few months. I think this would be reasonable.  For now, I spent about 45 minutes with him. It was nice to see him again. He came in with his wife.  He does have a very good sense of humor.   Volanda Napoleon, MD 7/24/20176:08 PM

## 2015-08-18 ENCOUNTER — Telehealth: Payer: Self-pay | Admitting: *Deleted

## 2015-08-18 NOTE — Telephone Encounter (Addendum)
-----   Message from Volanda Napoleon, MD sent at 08/17/2015  5:36 PM EDT ----- Please call and tell him that the spleen is normal in size. The liver may have some scarring. I don't see anything that looks like cancer.  Steve Watson     Called patient with above message

## 2015-10-08 ENCOUNTER — Ambulatory Visit (HOSPITAL_BASED_OUTPATIENT_CLINIC_OR_DEPARTMENT_OTHER): Payer: Medicare Other | Admitting: Hematology & Oncology

## 2015-10-08 ENCOUNTER — Other Ambulatory Visit (HOSPITAL_BASED_OUTPATIENT_CLINIC_OR_DEPARTMENT_OTHER): Payer: Medicare Other

## 2015-10-08 ENCOUNTER — Encounter: Payer: Self-pay | Admitting: Hematology & Oncology

## 2015-10-08 VITALS — BP 114/64 | HR 76 | Temp 98.0°F | Resp 16 | Ht 69.0 in | Wt 186.0 lb

## 2015-10-08 DIAGNOSIS — D7381 Neutropenic splenomegaly: Secondary | ICD-10-CM | POA: Diagnosis not present

## 2015-10-08 DIAGNOSIS — D696 Thrombocytopenia, unspecified: Secondary | ICD-10-CM | POA: Diagnosis not present

## 2015-10-08 DIAGNOSIS — D72819 Decreased white blood cell count, unspecified: Secondary | ICD-10-CM

## 2015-10-08 LAB — CBC WITH DIFFERENTIAL (CANCER CENTER ONLY)
BASO#: 0 10*3/uL (ref 0.0–0.2)
BASO%: 1.2 % (ref 0.0–2.0)
EOS%: 3.1 % (ref 0.0–7.0)
Eosinophils Absolute: 0.1 10*3/uL (ref 0.0–0.5)
HCT: 38.1 % — ABNORMAL LOW (ref 38.7–49.9)
HGB: 13.7 g/dL (ref 13.0–17.1)
LYMPH#: 1.3 10*3/uL (ref 0.9–3.3)
LYMPH%: 41.3 % (ref 14.0–48.0)
MCH: 34.3 pg — ABNORMAL HIGH (ref 28.0–33.4)
MCHC: 36 g/dL — AB (ref 32.0–35.9)
MCV: 95 fL (ref 82–98)
MONO#: 0.4 10*3/uL (ref 0.1–0.9)
MONO%: 10.9 % (ref 0.0–13.0)
NEUT#: 1.4 10*3/uL — ABNORMAL LOW (ref 1.5–6.5)
NEUT%: 43.5 % (ref 40.0–80.0)
RBC: 4 10*6/uL — ABNORMAL LOW (ref 4.20–5.70)
RDW: 13.7 % (ref 11.1–15.7)
WBC: 3.2 10*3/uL — ABNORMAL LOW (ref 4.0–10.0)

## 2015-10-08 LAB — CHCC SATELLITE - SMEAR

## 2015-10-08 NOTE — Progress Notes (Signed)
Hematology and Oncology Follow Up Visit  Steve Watson [id number] 1940/07/22 75 y.o. 10/08/2015   Principle Diagnosis:   Leukopenia and Thrombocytopenia  Current Therapy:    Observation     Interim History:  Steve Watson is back for follow-up. He is doing well. We saw him in July.  I did get a abdominal ultrasound on him that day. Looks like he does have some cirrhosis. There is no splenomegaly. He has no lymphadenopathy.  He's had no problem with abdominal pain. He's had no bleeding. He's had no bruising. He's had no infections.  He is not smoking. He is not drinking.  He's had no leg swelling. There's not been any change in medications.\   Overall, his performance status is ECOG 1.   Medications:  Current Outpatient Prescriptions:  .  aspirin 81 MG tablet, Take 81 mg by mouth daily., Disp: , Rfl:  .  calcium & magnesium carbonates (MYLANTA) 311-232 MG per tablet, Take 1 tablet by mouth daily., Disp: , Rfl:  .  Cholecalciferol 2000 UNITS TABS, Take 1 tablet (2,000 Units total) by mouth daily at 12 noon., Disp: 30 each, Rfl: 2 .  omeprazole (PRILOSEC) 10 MG capsule, Take 10 mg by mouth as needed. , Disp: , Rfl:   Allergies: No Known Allergies  Past Medical History, Surgical history, Social history, and Family History were reviewed and updated.  Review of Systems: As above  Physical Exam:  height is 5' 9"  (1.753 m) and weight is 186 lb (84.4 kg). His oral temperature is 98 F (36.7 C). His blood pressure is 114/64 and his pulse is 76. His respiration is 16.   Wt Readings from Last 3 Encounters:  10/08/15 186 lb (84.4 kg)  08/17/15 187 lb (84.8 kg)  08/10/15 187 lb (84.8 kg)     Well-developed and well-nourished white male in no obvious distress. Head and neck exam shows no ocular or oral lesions. He has no palpable cervical or supraclavicular lymph nodes. Lungs are clear to percussion and auscultation bilaterally. Cardiac exam regular rate and rhythm with no  murmurs, rubs or bruits. Abdomen is soft. He has decent bowel sounds. There is no fluid wave. There is no palpable liver tip. Spleen tip might be palpable with inspiration. Back exam shows no tenderness over the spine, ribs or hips. Extremities shows no clubbing, cyanosis or edema. Neurological exam shows no focal neurological deficit. Skin exam shows some rare scattered ecchymoses.  Lab Results  Component Value Date   WBC 3.2 (L) 10/08/2015   HGB 13.7 10/08/2015   HCT 38.1 (L) 10/08/2015   MCV 95 10/08/2015   PLT 79 Platelet count consistent in citrate (L) 10/08/2015     Chemistry      Component Value Date/Time   NA 141 07/30/2015 1734   K 3.9 07/30/2015 1734   CL 113 (H) 07/30/2015 1734   CO2 23 07/30/2015 1734   BUN 14 07/30/2015 1734   CREATININE 0.77 07/30/2015 1734      Component Value Date/Time   CALCIUM 9.3 07/30/2015 1734   ALKPHOS 62 07/30/2015 1734   AST 28 07/30/2015 1734   ALT 24 07/30/2015 1734   BILITOT 0.9 07/30/2015 1734      On his blood smear, he has a normochromic and normocytic population of red blood cells. He has no nucleated red blood cells. He has no schistocytes or spherocytes. There is no target cells. He has no rouleau formation. White cells are slightly decreased in number. He has good maturation  of his white blood cells. He has no hypersegmented polys. He has no immature myeloid or lymphoid forms. Platelets are decreased in number. Platelets are small. Platelets are well granulated.  Impression and Plan: Steve Watson is a 75 year old white male. He has relatively stable blood counts from my point of view. His white cell count might be a little bit lower. He has a normal white cell differential. He is not anemic. His platelet count is holding steady.  It is possible he may have myelodysplasia. He is totally asymptomatic right now. Because of this, I think we can still watch him closely.  I like to see him back in 3 months. I think this would be very  reasonable.  As always, it is fun talk to him. He has a very good sense of humor.  Volanda Napoleon, MD 9/14/20171:08 PM

## 2015-10-09 LAB — IRON AND TIBC
%SAT: 34 % (ref 20–55)
IRON: 90 ug/dL (ref 42–163)
TIBC: 266 ug/dL (ref 202–409)
UIBC: 177 ug/dL (ref 117–376)

## 2015-10-09 LAB — RETICULOCYTES: Reticulocyte Count: 0.7 % (ref 0.6–2.6)

## 2015-10-09 LAB — FERRITIN: Ferritin: 42 ng/ml (ref 22–316)

## 2016-01-07 ENCOUNTER — Other Ambulatory Visit (HOSPITAL_BASED_OUTPATIENT_CLINIC_OR_DEPARTMENT_OTHER): Payer: Medicare Other

## 2016-01-07 ENCOUNTER — Ambulatory Visit (HOSPITAL_BASED_OUTPATIENT_CLINIC_OR_DEPARTMENT_OTHER): Payer: Medicare Other | Admitting: Hematology & Oncology

## 2016-01-07 VITALS — BP 136/70 | HR 78 | Temp 98.1°F | Resp 16 | Wt 192.0 lb

## 2016-01-07 DIAGNOSIS — D696 Thrombocytopenia, unspecified: Secondary | ICD-10-CM

## 2016-01-07 DIAGNOSIS — D72819 Decreased white blood cell count, unspecified: Secondary | ICD-10-CM

## 2016-01-07 LAB — CBC WITH DIFFERENTIAL (CANCER CENTER ONLY)
BASO#: 0 10*3/uL (ref 0.0–0.2)
BASO%: 1 % (ref 0.0–2.0)
EOS%: 2.9 % (ref 0.0–7.0)
Eosinophils Absolute: 0.1 10*3/uL (ref 0.0–0.5)
HEMATOCRIT: 36.7 % — AB (ref 38.7–49.9)
HEMOGLOBIN: 12.8 g/dL — AB (ref 13.0–17.1)
LYMPH#: 1.4 10*3/uL (ref 0.9–3.3)
LYMPH%: 44.4 % (ref 14.0–48.0)
MCH: 33.2 pg (ref 28.0–33.4)
MCHC: 34.9 g/dL (ref 32.0–35.9)
MCV: 95 fL (ref 82–98)
MONO#: 0.4 10*3/uL (ref 0.1–0.9)
MONO%: 12.5 % (ref 0.0–13.0)
NEUT%: 39.2 % — ABNORMAL LOW (ref 40.0–80.0)
NEUTROS ABS: 1.2 10*3/uL — AB (ref 1.5–6.5)
Platelets: 62 10*3/uL — ABNORMAL LOW (ref 145–400)
RBC: 3.86 10*6/uL — ABNORMAL LOW (ref 4.20–5.70)
RDW: 14 % (ref 11.1–15.7)
WBC: 3.1 10*3/uL — ABNORMAL LOW (ref 4.0–10.0)

## 2016-01-07 LAB — CHCC SATELLITE - SMEAR

## 2016-01-07 NOTE — Progress Notes (Signed)
Hematology and Oncology Follow Up Visit  Steve Watson [id number] [birth year] 75 y.o. 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 He is doing well. We saw him in September.  He has had no problems with bleeding or bruising. He has had no infections. He's had no fever. He's had no rashes. He's had no change in bowel or bladder habits. He's had no change in medications.   He had a good Thanksgiving. He ate quite a lot.   He is not drinking. He is not smoking.   There's been no leg swelling. He's had no melena or bright red blood per rectum.   He's not noted any swollen lymph nodes.   Overall, his performance status is ECOG 1.   Medications:  Current Outpatient Prescriptions:  .  aspirin 81 MG tablet, Take 81 mg by mouth daily., Disp: , Rfl:  .  calcium & magnesium carbonates (MYLANTA) 311-232 MG per tablet, Take 1 tablet by mouth daily., Disp: , Rfl:  .  Cholecalciferol 2000 UNITS TABS, Take 1 tablet (2,000 Units total) by mouth daily at 12 noon., Disp: 30 each, Rfl: 2 .  omeprazole (PRILOSEC) 10 MG capsule, Take 10 mg by mouth as needed. , Disp: , Rfl:   Allergies: No Known Allergies  Past Medical History, Surgical history, Social history, and Family History were reviewed and updated.  Review of Systems: As above  Physical Exam:  weight is 192 lb (87.1 kg). His oral temperature is 98.1 F (36.7 C). His blood pressure is 136/70 and his pulse is 78. His respiration is 16.   Wt Readings from Last 3 Encounters:  01/07/16 192 lb (87.1 kg)  10/08/15 186 lb (84.4 kg)  08/17/15 187 lb (84.8 kg)     Well-developed and well-nourished white male in no obvious distress. Head and neck exam shows no ocular or oral lesions. He has no palpable cervical or supraclavicular lymph nodes. Lungs are clear to percussion and auscultation  bilaterally. Cardiac exam regular rate and rhythm with no murmurs, rubs or bruits. Abdomen is soft. He has decent bowel sounds. There is no fluid wave. There is no palpable liver tip. Spleen tip might be palpable with inspiration. Back exam shows no tenderness over the spine, ribs or hips. Extremities shows no clubbing, cyanosis or edema. Neurological exam shows no focal neurological deficit. Skin exam shows some rare scattered ecchymoses.  Lab Results  Component Value Date   WBC 3.1 (L) 01/07/2016   HGB 12.8 (L) 01/07/2016   HCT 36.7 (L) 01/07/2016   MCV 95 01/07/2016   PLT 62 Platelet count consistent in citrate (L) 01/07/2016     Chemistry      Component Value Date/Time   NA 141 07/30/2015 1734   K 3.9 07/30/2015 1734   CL 113 (H) 07/30/2015 1734   CO2 23 07/30/2015 1734   BUN 14 07/30/2015 1734   CREATININE 0.77 07/30/2015 1734      Component Value Date/Time   CALCIUM 9.3 07/30/2015 1734   ALKPHOS 62 07/30/2015 1734   AST 28 07/30/2015 1734   ALT 24 07/30/2015 1734   BILITOT 0.9 07/30/2015 1734      On his blood smear, he has a normochromic and normocytic population of red blood cells. He has no nucleated red blood cells. He has no schistocytes or spherocytes. There is no target  cells. He has no rouleau formation. White cells are slightly decreased in number. He has good maturation of his white blood cells. He has no hypersegmented polys. He has no immature myeloid or lymphoid forms. Platelets are decreased in number. Platelets are small. Platelets are well granulated.  Impression and Plan: Steve Watson is a 75 year old white male. He has relatively stable blood counts from my point of view. HisPlatelet count might be a little bit lower. He has a normal white cell differential. He is mildly anemic.   It is possible he may have myelodysplasia. He is totally asymptomatic right now. Because of this, I think we can still watch him closely.  I like to see him back in 4 months. I  think this would be very reasonable. I will I to get him through the wintertime. I just do not want seem come out in any bad weather.  As always, it is fun talk to him. He has a very good sense of humor.  Volanda Napoleon, MD 12/14/201710:04 AM

## 2016-05-12 ENCOUNTER — Other Ambulatory Visit (HOSPITAL_BASED_OUTPATIENT_CLINIC_OR_DEPARTMENT_OTHER): Payer: Medicare Other

## 2016-05-12 ENCOUNTER — Ambulatory Visit (HOSPITAL_BASED_OUTPATIENT_CLINIC_OR_DEPARTMENT_OTHER): Payer: Medicare Other | Admitting: Hematology & Oncology

## 2016-05-12 VITALS — BP 128/72 | HR 75 | Temp 97.9°F | Resp 17 | Wt 188.1 lb

## 2016-05-12 DIAGNOSIS — D696 Thrombocytopenia, unspecified: Secondary | ICD-10-CM

## 2016-05-12 DIAGNOSIS — D708 Other neutropenia: Secondary | ICD-10-CM

## 2016-05-12 LAB — CBC WITH DIFFERENTIAL (CANCER CENTER ONLY)
BASO#: 0 10*3/uL (ref 0.0–0.2)
BASO%: 1.2 % (ref 0.0–2.0)
EOS ABS: 0.2 10*3/uL (ref 0.0–0.5)
EOS%: 4.5 % (ref 0.0–7.0)
HCT: 38.1 % — ABNORMAL LOW (ref 38.7–49.9)
HEMOGLOBIN: 13.2 g/dL (ref 13.0–17.1)
LYMPH#: 1.6 10*3/uL (ref 0.9–3.3)
LYMPH%: 46 % (ref 14.0–48.0)
MCH: 32 pg (ref 28.0–33.4)
MCHC: 34.6 g/dL (ref 32.0–35.9)
MCV: 93 fL (ref 82–98)
MONO#: 0.4 10*3/uL (ref 0.1–0.9)
MONO%: 11.6 % (ref 0.0–13.0)
NEUT#: 1.2 10*3/uL — ABNORMAL LOW (ref 1.5–6.5)
NEUT%: 36.7 % — ABNORMAL LOW (ref 40.0–80.0)
Platelets: 69 10*3/uL — ABNORMAL LOW (ref 145–400)
RBC: 4.12 10*6/uL — ABNORMAL LOW (ref 4.20–5.70)
RDW: 14.4 % (ref 11.1–15.7)
WBC: 3.4 10*3/uL — AB (ref 4.0–10.0)

## 2016-05-12 LAB — CHCC SATELLITE - SMEAR

## 2016-05-12 NOTE — Progress Notes (Signed)
Hematology and Oncology Follow Up Visit  Steve Watson [id number] 20-Aug-1940 76 y.o. 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 is back for follow-up. He is doing well. We saw him in December.  He has had no problems with bleeding or bruising. He has had no infections. He's had no fever. He's had no rashes. He's had no change in bowel or bladder habits. He's had no change in medications.   He is now retired. He says that his company retired him because of his age. He does not mindedness. He will spend the summer watching his grandson play baseball.  He's had no issues with fever. He's had no infections. He's had no influenza.  He is not drinking. He is not smoking.   There's been no leg swelling. He's had no melena or bright red blood per rectum.   He's not noted any swollen lymph nodes.   Overall, his performance status is ECOG 1.   Medications:  Current Outpatient Prescriptions:  .  aspirin 81 MG tablet, Take 81 mg by mouth daily., Disp: , Rfl:  .  calcium & magnesium carbonates (MYLANTA) 311-232 MG per tablet, Take 1 tablet by mouth daily., Disp: , Rfl:  .  Cholecalciferol 2000 UNITS TABS, Take 1 tablet (2,000 Units total) by mouth daily at 12 noon., Disp: 30 each, Rfl: 2 .  omeprazole (PRILOSEC) 10 MG capsule, Take 10 mg by mouth as needed. , Disp: , Rfl:   Allergies: No Known Allergies  Past Medical History, Surgical history, Social history, and Family History were reviewed and updated.  Review of Systems: As above  Physical Exam:  weight is 188 lb 1.9 oz (85.3 kg). His oral temperature is 97.9 F (36.6 C). His blood pressure is 128/72 and his pulse is 75. His respiration is 17 and oxygen saturation is 100%.   Wt Readings from Last 3 Encounters:  05/12/16 188 lb 1.9 oz (85.3 kg)  01/07/16 192 lb (87.1 kg)  10/08/15 186 lb (84.4 kg)      Well-developed and well-nourished white male in no obvious distress. Head and neck exam shows no ocular or oral lesions. He has no palpable cervical or supraclavicular lymph nodes. Lungs are clear to percussion and auscultation bilaterally. Cardiac exam regular rate and rhythm with no murmurs, rubs or bruits. Abdomen is soft. He has decent bowel sounds. There is no fluid wave. There is no palpable liver tip. Spleen tip might be palpable with inspiration. Back exam shows no tenderness over the spine, ribs or hips. Extremities shows no clubbing, cyanosis or edema. Neurological exam shows no focal neurological deficit. Skin exam shows some rare scattered ecchymoses.  Lab Results  Component Value Date   WBC 3.4 (L) 05/12/2016   HGB 13.2 05/12/2016   HCT 38.1 (L) 05/12/2016   MCV 93 05/12/2016   PLT 69 (L) 05/12/2016     Chemistry      Component Value Date/Time   NA 141 07/30/2015 1734   K 3.9 07/30/2015 1734   CL 113 (H) 07/30/2015 1734   CO2 23 07/30/2015 1734   BUN 14 07/30/2015 1734   CREATININE 0.77 07/30/2015 1734      Component Value Date/Time   CALCIUM 9.3 07/30/2015 1734   ALKPHOS 62 07/30/2015 1734   AST 28 07/30/2015 1734   ALT 24 07/30/2015 1734   BILITOT 0.9 07/30/2015 1734  On his blood smear, he has a normochromic and normocytic population of red blood cells. He has no nucleated red blood cells. He has no schistocytes or spherocytes. There is no target cells. He has no rouleau formation. White cells are slightly decreased in number. He has good maturation of his white blood cells. He has no hypersegmented polys. He has no immature myeloid or lymphoid forms. Platelets are decreased in number. Platelets are small. Platelets are well granulated.  Impression and Plan: [name] is a 76 year old white male. He has relatively stable blood counts from my point of view.   From my point of view, his blood count is holding steady. As such I think we can get him back  in 6 months.  I don't see a need for a bone marrow biopsy.  As always, it is fun talk to him. He has a very good sense of humor.  Volanda Napoleon, MD 4/19/20188:32 AM

## 2016-11-10 ENCOUNTER — Ambulatory Visit (HOSPITAL_BASED_OUTPATIENT_CLINIC_OR_DEPARTMENT_OTHER): Payer: Medicare Other | Admitting: Hematology & Oncology

## 2016-11-10 ENCOUNTER — Other Ambulatory Visit (HOSPITAL_BASED_OUTPATIENT_CLINIC_OR_DEPARTMENT_OTHER): Payer: Medicare Other

## 2016-11-10 VITALS — BP 148/79 | HR 84 | Temp 98.1°F | Resp 16 | Wt 200.0 lb

## 2016-11-10 DIAGNOSIS — D696 Thrombocytopenia, unspecified: Secondary | ICD-10-CM | POA: Diagnosis not present

## 2016-11-10 DIAGNOSIS — D7381 Neutropenic splenomegaly: Secondary | ICD-10-CM

## 2016-11-10 DIAGNOSIS — D708 Other neutropenia: Secondary | ICD-10-CM

## 2016-11-10 LAB — CBC WITH DIFFERENTIAL (CANCER CENTER ONLY)
BASO#: 0 10*3/uL (ref 0.0–0.2)
BASO%: 1.2 % (ref 0.0–2.0)
EOS ABS: 0.1 10*3/uL (ref 0.0–0.5)
EOS%: 4.2 % (ref 0.0–7.0)
HCT: 36.5 % — ABNORMAL LOW (ref 38.7–49.9)
HGB: 12.6 g/dL — ABNORMAL LOW (ref 13.0–17.1)
LYMPH#: 1.2 10*3/uL (ref 0.9–3.3)
LYMPH%: 37.3 % (ref 14.0–48.0)
MCH: 32.1 pg (ref 28.0–33.4)
MCHC: 34.5 g/dL (ref 32.0–35.9)
MCV: 93 fL (ref 82–98)
MONO#: 0.4 10*3/uL (ref 0.1–0.9)
MONO%: 12.1 % (ref 0.0–13.0)
NEUT#: 1.5 10*3/uL (ref 1.5–6.5)
NEUT%: 45.2 % (ref 40.0–80.0)
PLATELETS: 63 10*3/uL — AB (ref 145–400)
RBC: 3.92 10*6/uL — AB (ref 4.20–5.70)
RDW: 14.1 % (ref 11.1–15.7)
WBC: 3.3 10*3/uL — ABNORMAL LOW (ref 4.0–10.0)

## 2016-11-10 LAB — CHCC SATELLITE - SMEAR

## 2016-11-10 NOTE — Progress Notes (Signed)
Hematology and Oncology Follow Up Visit  Steve Watson [id number] 07-16-1940 76 y.o. 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 He had a very good summer. He was busy watching one of his grandsons playing baseball. Hopefully, his grandson will be able to play for a minor league baseball team.  Steve Watson has had no problems with bleeding or oozing. He's had no problems with nausea or vomiting. She's had no abdominal pain. He's had no change in bowel or bladder habits. Her graft he's had no fever. He's had no issues with infections.  He's had no cough or shortness of breath.  There's been no travel.  He is not a vegetarian.  He is retired from work.   Overall, his performance status is ECOG 1.   Medications:  Current Outpatient Prescriptions:  .  aspirin 81 MG tablet, Take 81 mg by mouth daily., Disp: , Rfl:  .  calcium & magnesium carbonates (MYLANTA) 311-232 MG per tablet, Take 1 tablet by mouth daily., Disp: , Rfl:  .  Cholecalciferol 2000 UNITS TABS, Take 1 tablet (2,000 Units total) by mouth daily at 12 noon., Disp: 30 each, Rfl: 2 .  omeprazole (PRILOSEC) 10 MG capsule, Take 10 mg by mouth as needed. , Disp: , Rfl:   Allergies: No Known Allergies  Past Medical History, Surgical history, Social history, and Family History were reviewed and updated.  Review of Systems: As stated in the interim history  Physical Exam:  weight is 200 lb (90.7 kg). His oral temperature is 98.1 F (36.7 C). His blood pressure is 148/79 (abnormal) and his pulse is 84. His respiration is 16 and oxygen saturation is 100%.   Wt Readings from Last 3 Encounters:  11/10/16 200 lb (90.7 kg)  05/12/16 188 lb 1.9 oz (85.3 kg)  01/07/16 192 lb (87.1 kg)     Well-developed and well-nourished white male. Head and neck exam shows no ocular or  oral lesions. There are no palpable cervical or supraclavicular lymph nodes. Lungs are clear bilaterally. Cardiac exam regular rate and rhythm with no murmurs, rubs or bruits. Abdomen is soft. He has good bowel sounds. There is no fluid wave. There is no guarding or rebound tenderness. There is no hepatomegaly. His spleen tip might be palpable with deep inspiration at the left costal margin. Back exam shows no tenderness over the spine, ribs or hips. Extremities shows no clubbing, cyanosis or edema. He has good range of motion of his joints. Skin exam shows no rashes, ecchymoses or petechia. Neurological exam shows no focal neurological deficits.   Lab Results  Component Value Date   WBC 3.3 (L) 11/10/2016   HGB 12.6 (L) 11/10/2016   HCT 36.5 (L) 11/10/2016   MCV 93 11/10/2016   PLT 63 (L) 11/10/2016     Chemistry      Component Value Date/Time   NA 141 07/30/2015 1734   K 3.9 07/30/2015 1734   CL 113 (H) 07/30/2015 1734   CO2 23 07/30/2015 1734   BUN 14 07/30/2015 1734   CREATININE 0.77 07/30/2015 1734      Component Value Date/Time   CALCIUM 9.3 07/30/2015 1734   ALKPHOS 62 07/30/2015 1734   AST 28 07/30/2015 1734   ALT 24 07/30/2015 1734   BILITOT 0.9 07/30/2015 1734      On his blood smear,  he has a normochromic and normocytic population of red blood cells. He has no nucleated red blood cells. He has no schistocytes or spherocytes. There is no target cells. He has no rouleau formation. White cells are slightly decreased in number. He has good maturation of his white blood cells. He has no hypersegmented polys. He has no immature myeloid or lymphoid forms. Platelets are decreased in number. Platelets are small. Platelets are well granulated.  Impression and Plan: Steve Watson is a 76 year old white male. He has relatively stable blood counts from my point of view.   Everything is holding quite stable. From a year ago, his blood counts really are the same.   I will plan to see  him back in another 6 months.  As always, it is always incredibly interesting talking to him.   Volanda Napoleon, MD 10/18/20188:58 AM

## 2017-04-17 DIAGNOSIS — H353131 Nonexudative age-related macular degeneration, bilateral, early dry stage: Secondary | ICD-10-CM | POA: Diagnosis not present

## 2017-04-17 DIAGNOSIS — H2513 Age-related nuclear cataract, bilateral: Secondary | ICD-10-CM | POA: Diagnosis not present

## 2017-05-11 ENCOUNTER — Inpatient Hospital Stay: Payer: Medicare Other | Attending: Hematology & Oncology | Admitting: Hematology & Oncology

## 2017-05-11 ENCOUNTER — Other Ambulatory Visit: Payer: Self-pay

## 2017-05-11 ENCOUNTER — Inpatient Hospital Stay: Payer: Medicare Other

## 2017-05-11 VITALS — BP 137/80 | HR 77 | Temp 97.7°F | Resp 17 | Wt 209.0 lb

## 2017-05-11 DIAGNOSIS — D7381 Neutropenic splenomegaly: Secondary | ICD-10-CM

## 2017-05-11 DIAGNOSIS — D72819 Decreased white blood cell count, unspecified: Secondary | ICD-10-CM

## 2017-05-11 DIAGNOSIS — D696 Thrombocytopenia, unspecified: Secondary | ICD-10-CM | POA: Diagnosis not present

## 2017-05-11 DIAGNOSIS — D704 Cyclic neutropenia: Secondary | ICD-10-CM

## 2017-05-11 LAB — CBC WITH DIFFERENTIAL (CANCER CENTER ONLY)
BASOS ABS: 0.1 10*3/uL (ref 0.0–0.1)
Basophils Relative: 2 %
EOS PCT: 5 %
Eosinophils Absolute: 0.1 10*3/uL (ref 0.0–0.5)
HEMATOCRIT: 33.3 % — AB (ref 38.7–49.9)
Hemoglobin: 11.1 g/dL — ABNORMAL LOW (ref 13.0–17.1)
LYMPHS PCT: 42 %
Lymphs Abs: 1.1 10*3/uL (ref 0.9–3.3)
MCH: 30.7 pg (ref 28.0–33.4)
MCHC: 33.3 g/dL (ref 32.0–35.9)
MCV: 92 fL (ref 82.0–98.0)
MONO ABS: 0.4 10*3/uL (ref 0.1–0.9)
MONOS PCT: 14 %
Neutro Abs: 1 10*3/uL — ABNORMAL LOW (ref 1.5–6.5)
Neutrophils Relative %: 37 %
PLATELETS: 62 10*3/uL — AB (ref 145–400)
RBC: 3.62 MIL/uL — ABNORMAL LOW (ref 4.20–5.70)
RDW: 16.1 % — ABNORMAL HIGH (ref 11.1–15.7)
WBC Count: 2.7 10*3/uL — ABNORMAL LOW (ref 4.0–10.0)

## 2017-05-11 LAB — PLATELET BY CITRATE

## 2017-05-11 NOTE — Progress Notes (Signed)
Hematology and Oncology Follow Up Visit  Dorsey Authement [id number] November 18, 1940 77 y.o. 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 Membreno is back for follow-up.  He is doing okay.  We see him every 6 months.  Since we last saw him, he has had no problems.  He has been active around the house.  He says he has to yards to take care of.  Unfortunately, his brother died in 01-Apr-2022.  His brother had quite a few health issues.  He has had no problems with fever.  He has had no rashes.  He has had no bleeding.  There is been no change in bowel or bladder habits.  He has had no change in medications.  There is been some weight gain.  He is has a good he is going to try to lose weight if possible.  Overall, his performance status is ECOG 1.   Medications:  Current Outpatient Medications:  Marland Kitchen  Multiple Vitamins-Minerals (VISION VITAMINS) TABS, Take 2 tablets by mouth daily., Disp: , Rfl:  .  aspirin 81 MG tablet, Take 81 mg by mouth daily., Disp: , Rfl:  .  calcium & magnesium carbonates (MYLANTA) 311-232 MG per tablet, Take 1 tablet by mouth daily., Disp: , Rfl:  .  Cholecalciferol 2000 UNITS TABS, Take 1 tablet (2,000 Units total) by mouth daily at 12 noon., Disp: 30 each, Rfl: 2 .  omeprazole (PRILOSEC) 10 MG capsule, Take 10 mg by mouth as needed. , Disp: , Rfl:   Allergies: No Known Allergies  Past Medical History, Surgical history, Social history, and Family History were reviewed and updated.  Review of Systems: Review of Systems  Constitutional: Negative.   HENT: Negative.   Eyes: Negative.   Respiratory: Negative.   Cardiovascular: Negative.   Gastrointestinal: Negative.   Genitourinary: Negative.   Musculoskeletal: Negative.   Skin: Negative.   Neurological: Negative.   Endo/Heme/Allergies: Negative.   Psychiatric/Behavioral: Negative.      Physical  Exam:  weight is 209 lb (94.8 kg). His oral temperature is 97.7 F (36.5 C). His blood pressure is 137/80 and his pulse is 77. His respiration is 17 and oxygen saturation is 100%.   Wt Readings from Last 3 Encounters:  05/11/17 209 lb (94.8 kg)  11/10/16 200 lb (90.7 kg)  05/12/16 188 lb 1.9 oz (85.3 kg)     Physical Exam  Constitutional: He is oriented to person, place, and time.  HENT:  Head: Normocephalic and atraumatic.  Mouth/Throat: Oropharynx is clear and moist.  Eyes: Pupils are equal, round, and reactive to light. EOM are normal.  Neck: Normal range of motion.  Cardiovascular: Normal rate, regular rhythm and normal heart sounds.  Pulmonary/Chest: Effort normal and breath sounds normal.  Abdominal: Soft. Bowel sounds are normal.  Musculoskeletal: Normal range of motion. He exhibits no edema, tenderness or deformity.  Lymphadenopathy:    He has no cervical adenopathy.  Neurological: He is alert and oriented to person, place, and time.  Skin: Skin is warm and dry. No rash noted. No erythema.  Psychiatric: He has a normal mood and affect. His behavior is normal. Judgment and thought content normal.  Vitals reviewed.    Lab Results  Component Value Date   WBC 2.7 (L) 05/11/2017   HGB 12.6 (L) 11/10/2016   HCT 33.3 (L) 05/11/2017   MCV 92.0 05/11/2017  PLT 62 (L) 05/11/2017     Chemistry      Component Value Date/Time   NA 141 07/30/2015 1734   K 3.9 07/30/2015 1734   CL 113 (H) 07/30/2015 1734   CO2 23 07/30/2015 1734   BUN 14 07/30/2015 1734   CREATININE 0.77 07/30/2015 1734      Component Value Date/Time   CALCIUM 9.3 07/30/2015 1734   ALKPHOS 62 07/30/2015 1734   AST 28 07/30/2015 1734   ALT 24 07/30/2015 1734   BILITOT 0.9 07/30/2015 1734      On his blood smear, he has a normochromic and normocytic population of red blood cells. He has no nucleated red blood cells. He has no schistocytes or spherocytes. There is no target cells. He has no rouleau  formation. White cells are slightly decreased in number. He has good maturation of his white blood cells. He has no hypersegmented polys. He has no immature myeloid or lymphoid forms. Platelets are decreased in number. Platelets are small. Platelets are well granulated.  Impression and Plan: Mr. Pineiro is a 77 year old white male. I am a little bit worried about his blood counts.  All of his blood counts are down a little bit.  I have thought about the possibility of him having myelodysplasia.  However, I have not had to do a bone marrow test on him.  We will get him back in 3 months now.  I think this is important.  I do not think we can wait 6 months.  If his blood counts are down further, we probably will have to do a bone marrow biopsy on him.  I explained all this to him.  He understands.  He will certainly let us know if he has any problems.Volanda Napoleon, MD 4/18/20198:12 AM

## 2017-08-08 ENCOUNTER — Other Ambulatory Visit (INDEPENDENT_AMBULATORY_CARE_PROVIDER_SITE_OTHER): Payer: Medicare Other

## 2017-08-08 ENCOUNTER — Ambulatory Visit (INDEPENDENT_AMBULATORY_CARE_PROVIDER_SITE_OTHER): Payer: Medicare Other | Admitting: Physician Assistant

## 2017-08-08 ENCOUNTER — Encounter: Payer: Self-pay | Admitting: Physician Assistant

## 2017-08-08 VITALS — BP 126/74 | HR 86 | Temp 98.1°F | Resp 15 | Ht 67.75 in | Wt 205.4 lb

## 2017-08-08 DIAGNOSIS — D696 Thrombocytopenia, unspecified: Secondary | ICD-10-CM

## 2017-08-08 DIAGNOSIS — Z0000 Encounter for general adult medical examination without abnormal findings: Secondary | ICD-10-CM

## 2017-08-08 DIAGNOSIS — Z23 Encounter for immunization: Secondary | ICD-10-CM

## 2017-08-08 DIAGNOSIS — R7309 Other abnormal glucose: Secondary | ICD-10-CM | POA: Diagnosis not present

## 2017-08-08 LAB — COMPREHENSIVE METABOLIC PANEL
ALBUMIN: 2.9 g/dL — AB (ref 3.5–5.2)
ALK PHOS: 69 U/L (ref 39–117)
ALT: 22 U/L (ref 0–53)
AST: 47 U/L — ABNORMAL HIGH (ref 0–37)
BUN: 10 mg/dL (ref 6–23)
CO2: 26 mEq/L (ref 19–32)
Calcium: 8.6 mg/dL (ref 8.4–10.5)
Chloride: 110 mEq/L (ref 96–112)
Creatinine, Ser: 0.76 mg/dL (ref 0.40–1.50)
GFR: 106.17 mL/min (ref >60.00)
Glucose, Bld: 124 mg/dL — ABNORMAL HIGH (ref 70–99)
POTASSIUM: 4 meq/L (ref 3.5–5.1)
Sodium: 141 mEq/L (ref 135–145)
TOTAL PROTEIN: 5.8 g/dL — AB (ref 6.0–8.3)
Total Bilirubin: 1.1 mg/dL (ref 0.2–1.2)

## 2017-08-08 LAB — LIPID PANEL
CHOLESTEROL: 111 mg/dL (ref 0–200)
HDL: 39.5 mg/dL (ref >39.00)
LDL Cholesterol: 39 mg/dL (ref 0–99)
NonHDL: 71.45
Total CHOL/HDL Ratio: 3
Triglycerides: 164 mg/dL — ABNORMAL HIGH (ref 0.0–149.0)
VLDL: 32.8 mg/dL (ref 0.0–40.0)

## 2017-08-08 LAB — CBC WITH DIFFERENTIAL/PLATELET
Basophils Absolute: 0 10*3/uL (ref 0.0–0.1)
Basophils Relative: 1.5 % (ref 0.0–3.0)
EOS PCT: 3.2 % (ref 0.0–5.0)
Eosinophils Absolute: 0.1 10*3/uL (ref 0.0–0.7)
HCT: 35.2 % — ABNORMAL LOW (ref 39.0–52.0)
HEMOGLOBIN: 11.9 g/dL — AB (ref 13.0–17.0)
LYMPHS PCT: 34.8 % (ref 12.0–46.0)
Lymphs Abs: 1 10*3/uL (ref 0.7–4.0)
MCHC: 33.7 g/dL (ref 30.0–36.0)
MCV: 90.8 fl (ref 78.0–100.0)
MONOS PCT: 12.5 % — AB (ref 3.0–12.0)
Monocytes Absolute: 0.4 10*3/uL (ref 0.1–1.0)
Neutro Abs: 1.4 10*3/uL (ref 1.4–7.7)
Neutrophils Relative %: 48 % (ref 43.0–77.0)
Platelets: 69 10*3/uL — ABNORMAL LOW (ref 150.0–400.0)
RBC: 3.88 Mil/uL — AB (ref 4.22–5.81)
RDW: 19 % — ABNORMAL HIGH (ref 11.5–15.5)
WBC: 2.9 10*3/uL — AB (ref 4.0–10.5)

## 2017-08-08 NOTE — Progress Notes (Signed)
Subjective:   Steve Watson is a 77 y.o. male who presents for Medicare Annual/Subsequent preventive examination. Patient is followed by Hematology for chronic thrombocytopenia. Reports Doing well. Endorses keeping a well-balanced diet. Exercise regimen as noted below.  Review of Systems:  Review of Systems  Constitutional: Negative for fever and weight loss.  HENT: Negative for ear discharge, ear pain, hearing loss and tinnitus.   Eyes: Negative for blurred vision, double vision, photophobia and pain.  Respiratory: Negative for cough and shortness of breath.   Cardiovascular: Negative for chest pain and palpitations.  Gastrointestinal: Negative for abdominal pain, blood in stool, constipation, diarrhea, heartburn, melena, nausea and vomiting.  Genitourinary: Negative for dysuria, flank pain, frequency, hematuria and urgency.  Musculoskeletal: Negative for falls.  Neurological: Negative for dizziness, loss of consciousness and headaches.  Endo/Heme/Allergies: Negative for environmental allergies.  Psychiatric/Behavioral: Negative for depression, hallucinations, substance abuse and suicidal ideas. The patient is not nervous/anxious and does not have insomnia.    Cardiac Risk Factors include: none     Objective:    Vitals: BP 126/74   Pulse 86   Temp 98.1 F (36.7 C) (Oral)   Resp 15   Ht 5' 7.75" (1.721 m)   Wt 205 lb 6.4 oz (93.2 kg)   SpO2 97%   BMI 31.46 kg/m   Body mass index is 31.46 kg/m.  Advanced Directives 08/08/2017 05/11/2017 11/10/2016 05/12/2016 01/07/2016 10/08/2015 08/17/2015  Does Patient Have a Medical Advance Directive? No No No No No No No  Would patient like information on creating a medical advance directive? Yes (Inpatient - patient defers creating a medical advance directive at this time) - - - - No - patient declined information No - patient declined information    Tobacco Social History   Tobacco Use  Smoking Status Never Smoker  Smokeless Tobacco  Never Used  Tobacco Comment   NEVER USED TOBACCO     Counseling given: Yes Comment: NEVER USED TOBACCO  Clinical Intake:  Pre-visit preparation completed: No  Pain : No/denies pain  Nutritional Status: BMI 25 -29 Overweight Nutritional Risks: None Diabetes: No  Interpreter Needed?: No  Past Medical History:  Diagnosis Date  . Chicken pox   . GERD (gastroesophageal reflux disease)   . 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 NO PAST SURGERIES     Family History  Problem Relation Age of Onset  . Hypertension Mother   . Hypertension Father   . Hypertension Brother   . Diabetes Brother   . Healthy Child   . Healthy Grandchild    Social History   Socioeconomic History  . Marital status: Married    Spouse name: Not on file  . Number of children: Not on file  . Years of education: Not on file  . Highest education level: Not on file  Occupational History  . Not on file  Social Needs  . Financial resource strain: Not on file  . Food insecurity:    Worry: Not on file    Inability: Not on file  . Transportation needs:    Medical: Not on file    Non-medical: Not on file  Tobacco Use  . Smoking status: Never Smoker  . Smokeless tobacco: Never Used  . Tobacco comment: NEVER USED TOBACCO  Substance and Sexual Activity  . Alcohol use: No    Alcohol/week: 0.0 oz  . Drug use: No  . Sexual activity: Yes  Lifestyle  .  Physical activity:    Days per week: Not on file    Minutes per session: Not on file  . Stress: Not on file  Relationships  . Social connections:    Talks on phone: Not on file    Gets together: Not on file    Attends religious service: Not on file    Active member of club or organization: Not on file    Attends meetings of clubs or organizations: Not on file    Relationship status: Not on file  Other Topics Concern  . Not on file  Social History Narrative  . Not on  file    Outpatient Encounter Medications as of 08/08/2017  Medication Sig  . aspirin 81 MG tablet Take 81 mg by mouth daily.  . calcium & magnesium carbonates (MYLANTA) 311-232 MG per tablet Take 1 tablet by mouth daily.  . Cholecalciferol 2000 UNITS TABS Take 1 tablet (2,000 Units total) by mouth daily at 12 noon.  . Multiple Vitamins-Minerals (VISION VITAMINS) TABS Take 2 tablets by mouth daily.  Marland Kitchen omeprazole (PRILOSEC) 10 MG capsule Take 10 mg by mouth as needed.    No facility-administered encounter medications on file as of 08/08/2017.     Activities of Daily Living In your present state of health, do you have any difficulty performing the following activities: 08/08/2017  Hearing? N  Vision? N  Difficulty concentrating or making decisions? N  Walking or climbing stairs? N  Dressing or bathing? N  Doing errands, shopping? N  Preparing Food and eating ? Y  Using the Toilet? Y  In the past six months, have you accidently leaked urine? Y  Do you have problems with loss of bowel control? Y  Managing your Medications? Y  Managing your Finances? Y  Housekeeping or managing your Housekeeping? Y  Some recent data might be hidden   Patient Care Team: Delorse Limber as PCP - General (Physician Assistant)   Assessment:   This is a routine wellness examination for [facility].  Exercise Activities and Dietary recommendations Current Exercise Habits: Home exercise routine, Type of exercise: strength training/weights;Other - see comments(weight lifting), Time (Minutes): 45, Frequency (Times/Week): 4, Weekly Exercise (Minutes/Week): 180, Intensity: Moderate, Exercise limited by: None identified  Fall Risk Fall Risk  08/08/2017 10/08/2015 08/17/2015 02/06/2015 10/17/2013  Falls in the past year? No No No No No   Is the patient's home free of loose throw rugs in walkways, pet beds, electrical cords, etc?   yes      Grab bars in the bathroom? no      Handrails on the stairs?   yes       Adequate lighting?   yes   Depression Screen PHQ 2/9 Scores 08/08/2017 02/06/2015  PHQ - 2 Score 0 0    Cognitive Function MMSE - Mini Mental State Exam 08/08/2017  Orientation to time 5  Orientation to Place 4  Registration 3  Attention/ Calculation 4  Recall 1  Language- name 2 objects 2  Language- repeat 1  Language- follow 3 step command 3  Language- read & follow direction 1  Write a sentence 1  Copy design 1  Total score 26        Immunization History  Administered Date(s) Administered  . Pneumococcal Conjugate-13 02/06/2015   Screening Tests Health Maintenance  Topic Date Due  . DTaP/Tdap/Td (1 - Tdap) 03/27/1961  . TETANUS/TDAP  03/27/1961  . PNA vac Low Risk Adult (2 of 2 - PPSV23) 02/06/2016  .  INFLUENZA VACCINE  08/24/2017  . Fecal DNA (Cologuard)  03/02/2018      Plan:  Medicare Wellness, Subsequent -- Examination without abnormal findings. Patient doing very well. Will check fasting labs today. Prevnar updated today.  Thrombocytopenia -- Followed by hematology. Repeat CBC today.  I have personally reviewed and noted the following in the patient's chart:   . Medical and social history . Use of alcohol, tobacco or illicit drugs  . Current medications and supplements . Functional ability and status . Nutritional status . Physical activity . Advanced directives . List of other physicians . Hospitalizations, surgeries, and ER visits in previous 12 months . Vitals . Screenings to include cognitive, depression, and falls . Referrals and appointments  In addition, I have reviewed and discussed with patient certain preventive protocols, quality metrics, and best practice recommendations. A written personalized care plan for preventive services as well as general preventive health recommendations were provided to patient.     Leeanne Rio, PA-C  08/08/2017

## 2017-08-08 NOTE — Patient Instructions (Signed)
Please go to the lab today for blood work.  I will call you with your results. We will alter treatment regimen(s) if indicated by your results.   Keep hydrated and keep a well-balanced diet. Please return the advanced directives form once completed.   Follow-up wit Dr. Marin Olp as scheduled.    Preventive Care 75 Years and Older, Male Preventive care refers to lifestyle choices and visits with your health care provider that can promote health and wellness. What does preventive care include?  A yearly physical exam. This is also called an annual well check.  Dental exams once or twice a year.  Routine eye exams. Ask your health care provider how often you should have your eyes checked.  Personal lifestyle choices, including: ? Daily care of your teeth and gums. ? Regular physical activity. ? Eating a healthy diet. ? Avoiding tobacco and drug use. ? Limiting alcohol use. ? Practicing safe sex. ? Taking low doses of aspirin every day. ? Taking vitamin and mineral supplements as recommended by your health care provider. What happens during an annual well check? The services and screenings done by your health care provider during your annual well check will depend on your age, overall health, lifestyle risk factors, and family history of disease. Counseling Your health care provider may ask you questions about your:  Alcohol use.  Tobacco use.  Drug use.  Emotional well-being.  Home and relationship well-being.  Sexual activity.  Eating habits.  History of falls.  Memory and ability to understand (cognition).  Work and work Statistician.  Screening You may have the following tests or measurements:  Height, weight, and BMI.  Blood pressure.  Lipid and cholesterol levels. These may be checked every 5 years, or more frequently if you are over 55 years old.  Skin check.  Lung cancer screening. You may have this screening every year starting at age 58 if you have  a 30-pack-year history of smoking and currently smoke or have quit within the past 15 years.  Fecal occult blood test (FOBT) of the stool. You may have this test every year starting at age 33.  Flexible sigmoidoscopy or colonoscopy. You may have a sigmoidoscopy every 5 years or a colonoscopy every 10 years starting at age 60.  Prostate cancer screening. Recommendations will vary depending on your family history and other risks.  Hepatitis C blood test.  Hepatitis B blood test.  Sexually transmitted disease (STD) testing.  Diabetes screening. This is done by checking your blood sugar (glucose) after you have not eaten for a while (fasting). You may have this done every 1-3 years.  Abdominal aortic aneurysm (AAA) screening. You may need this if you are a current or former smoker.  Osteoporosis. You may be screened starting at age 1 if you are at high risk.  Talk with your health care provider about your test results, treatment options, and if necessary, the need for more tests. Vaccines Your health care provider may recommend certain vaccines, such as:  Influenza vaccine. This is recommended every year.  Tetanus, diphtheria, and acellular pertussis (Tdap, Td) vaccine. You may need a Td booster every 10 years.  Varicella vaccine. You may need this if you have not been vaccinated.  Zoster vaccine. You may need this after age 23.  Measles, mumps, and rubella (MMR) vaccine. You may need at least one dose of MMR if you were born in 1957 or later. You may also need a second dose.  Pneumococcal 13-valent conjugate (PCV13)  vaccine. One dose is recommended after age 76.  Pneumococcal polysaccharide (PPSV23) vaccine. One dose is recommended after age 60.  Meningococcal vaccine. You may need this if you have certain conditions.  Hepatitis A vaccine. You may need this if you have certain conditions or if you travel or work in places where you may be exposed to hepatitis A.  Hepatitis B  vaccine. You may need this if you have certain conditions or if you travel or work in places where you may be exposed to hepatitis B.  Haemophilus influenzae type b (Hib) vaccine. You may need this if you have certain risk factors.  Talk to your health care provider about which screenings and vaccines you need and how often you need them. This information is not intended to replace advice given to you by your health care provider. Make sure you discuss any questions you have with your health care provider. Document Released: 02/06/2015 Document Revised: 09/30/2015 Document Reviewed: 11/11/2014 Elsevier Interactive Patient Education  [employer].

## 2017-08-09 LAB — HEMOGLOBIN A1C: Hgb A1c MFr Bld: 5.2 % (ref 4.6–6.5)

## 2017-08-10 ENCOUNTER — Inpatient Hospital Stay: Payer: Medicare Other

## 2017-08-10 ENCOUNTER — Encounter: Payer: Self-pay | Admitting: Hematology & Oncology

## 2017-08-10 ENCOUNTER — Inpatient Hospital Stay: Payer: Medicare Other | Attending: Hematology & Oncology | Admitting: Hematology & Oncology

## 2017-08-10 ENCOUNTER — Other Ambulatory Visit: Payer: Self-pay | Admitting: Emergency Medicine

## 2017-08-10 ENCOUNTER — Telehealth: Payer: Self-pay | Admitting: Physician Assistant

## 2017-08-10 ENCOUNTER — Other Ambulatory Visit: Payer: Self-pay

## 2017-08-10 ENCOUNTER — Other Ambulatory Visit: Payer: Self-pay | Admitting: Physician Assistant

## 2017-08-10 VITALS — BP 126/71 | HR 79 | Temp 98.2°F | Resp 16 | Wt 204.0 lb

## 2017-08-10 DIAGNOSIS — T451X5A Adverse effect of antineoplastic and immunosuppressive drugs, initial encounter: Secondary | ICD-10-CM

## 2017-08-10 DIAGNOSIS — K7460 Unspecified cirrhosis of liver: Secondary | ICD-10-CM | POA: Diagnosis not present

## 2017-08-10 DIAGNOSIS — D704 Cyclic neutropenia: Secondary | ICD-10-CM

## 2017-08-10 DIAGNOSIS — Z7982 Long term (current) use of aspirin: Secondary | ICD-10-CM | POA: Diagnosis not present

## 2017-08-10 DIAGNOSIS — D696 Thrombocytopenia, unspecified: Secondary | ICD-10-CM

## 2017-08-10 DIAGNOSIS — R740 Nonspecific elevation of levels of transaminase and lactic acid dehydrogenase [LDH]: Principal | ICD-10-CM

## 2017-08-10 DIAGNOSIS — Z79899 Other long term (current) drug therapy: Secondary | ICD-10-CM | POA: Diagnosis not present

## 2017-08-10 DIAGNOSIS — D72819 Decreased white blood cell count, unspecified: Secondary | ICD-10-CM | POA: Diagnosis not present

## 2017-08-10 DIAGNOSIS — R7401 Elevation of levels of liver transaminase levels: Secondary | ICD-10-CM

## 2017-08-10 DIAGNOSIS — D701 Agranulocytosis secondary to cancer chemotherapy: Secondary | ICD-10-CM

## 2017-08-10 LAB — RETICULOCYTES
RBC.: 3.82 MIL/uL — AB (ref 4.20–5.82)
Retic Count, Absolute: 57.3 10*3/uL (ref 34.8–93.9)
Retic Ct Pct: 1.5 % (ref 0.8–1.8)

## 2017-08-10 LAB — LACTATE DEHYDROGENASE: LDH: 178 U/L (ref 98–192)

## 2017-08-10 LAB — CBC WITH DIFFERENTIAL (CANCER CENTER ONLY)
BASOS ABS: 0 10*3/uL (ref 0.0–0.1)
Basophils Relative: 2 %
EOS ABS: 0.1 10*3/uL (ref 0.0–0.5)
EOS PCT: 4 %
HCT: 34.6 % — ABNORMAL LOW (ref 38.7–49.9)
Hemoglobin: 11.6 g/dL — ABNORMAL LOW (ref 13.0–17.1)
LYMPHS ABS: 0.9 10*3/uL (ref 0.9–3.3)
Lymphocytes Relative: 35 %
MCH: 30.3 pg (ref 28.0–33.4)
MCHC: 33.5 g/dL (ref 32.0–35.9)
MCV: 90.3 fL (ref 82.0–98.0)
MONO ABS: 0.3 10*3/uL (ref 0.1–0.9)
Monocytes Relative: 13 %
Neutro Abs: 1.2 10*3/uL — ABNORMAL LOW (ref 1.5–6.5)
Neutrophils Relative %: 46 %
PLATELETS: 65 10*3/uL — AB (ref 145–400)
RBC: 3.83 MIL/uL — AB (ref 4.20–5.70)
RDW: 17.2 % — AB (ref 11.1–15.7)
WBC: 2.5 10*3/uL — AB (ref 4.0–10.0)

## 2017-08-10 LAB — FERRITIN: Ferritin: 12 ng/mL — ABNORMAL LOW (ref 24–336)

## 2017-08-10 LAB — IRON AND TIBC
Iron: 77 ug/dL (ref 42–163)
SATURATION RATIOS: 24 % — AB (ref 42–163)
TIBC: 325 ug/dL (ref 202–409)
UIBC: 248 ug/dL

## 2017-08-10 LAB — COMPREHENSIVE METABOLIC PANEL
ALK PHOS: 80 U/L (ref 38–126)
ALT: 24 U/L (ref 0–44)
ANION GAP: 5 (ref 5–15)
AST: 41 U/L (ref 15–41)
Albumin: 2.8 g/dL — ABNORMAL LOW (ref 3.5–5.0)
BUN: 9 mg/dL (ref 8–23)
CALCIUM: 8.3 mg/dL — AB (ref 8.9–10.3)
CO2: 23 mmol/L (ref 22–32)
Chloride: 112 mmol/L — ABNORMAL HIGH (ref 98–111)
Creatinine, Ser: 0.78 mg/dL (ref 0.61–1.24)
Glucose, Bld: 132 mg/dL — ABNORMAL HIGH (ref 70–99)
Potassium: 4.1 mmol/L (ref 3.5–5.1)
SODIUM: 140 mmol/L (ref 135–145)
TOTAL PROTEIN: 5.7 g/dL — AB (ref 6.5–8.1)
Total Bilirubin: 1.2 mg/dL (ref 0.3–1.2)

## 2017-08-10 LAB — TECHNOLOGIST SMEAR REVIEW

## 2017-08-10 NOTE — Telephone Encounter (Signed)
Copied from [place] [phone]. Topic: Inquiry >> Aug 10, 2017 10:57 AM Pricilla Handler wrote: Reason for CRM: Patient called back for his lab results. A Triage Nurse was not available at the time.       Thank You!!!

## 2017-08-10 NOTE — Progress Notes (Signed)
Hematology and Oncology Follow Up Visit  Beckhem Isadore [id number] [birth year] 77 y.o. 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 Portela is back for follow-up.  He is doing okay.  We last saw him back in April.  Since then, he really has had no problems.  He is spending time the summer mowing his yard.  He enjoys this.  He has had no problem with fever.  He has had no rashes.  He has had no nausea or vomiting.  He has had no cough.  There is been no change in bowel or bladder habits.  He has had no leg swelling.  He has had no bleeding.  Overall, his performance status is ECOG 1.   Medications:  Current Outpatient Medications:  .  aspirin 81 MG tablet, Take 81 mg by mouth daily., Disp: , Rfl:  .  calcium & magnesium carbonates (MYLANTA) 311-232 MG per tablet, Take 1 tablet by mouth daily., Disp: , Rfl:  .  Cholecalciferol 2000 UNITS TABS, Take 1 tablet (2,000 Units total) by mouth daily at 12 noon., Disp: 30 each, Rfl: 2 .  Multiple Vitamins-Minerals (VISION VITAMINS) TABS, Take 2 tablets by mouth daily., Disp: , Rfl:  .  omeprazole (PRILOSEC) 10 MG capsule, Take 10 mg by mouth as needed. , Disp: , Rfl:   Allergies: No Known Allergies  Past Medical History, Surgical history, Social history, and Family History were reviewed and updated.  Review of Systems: Review of Systems  Constitutional: Negative.   HENT: Negative.   Eyes: Negative.   Respiratory: Negative.   Cardiovascular: Negative.   Gastrointestinal: Negative.   Genitourinary: Negative.   Musculoskeletal: Negative.   Skin: Negative.   Neurological: Negative.   Endo/Heme/Allergies: Negative.   Psychiatric/Behavioral: Negative.      Physical Exam:  weight is 204 lb (92.5 kg). His oral temperature is 98.2 F (36.8 C). His blood pressure is 126/71 and his pulse is 79. His respiration is 16 and  oxygen saturation is 100%.   Wt Readings from Last 3 Encounters:  08/10/17 204 lb (92.5 kg)  08/08/17 205 lb 6.4 oz (93.2 kg)  05/11/17 209 lb (94.8 kg)     Physical Exam  Constitutional: He is oriented to person, place, and time.  HENT:  Head: Normocephalic and atraumatic.  Mouth/Throat: Oropharynx is clear and moist.  Eyes: Pupils are equal, round, and reactive to light. EOM are normal.  Neck: Normal range of motion.  Cardiovascular: Normal rate, regular rhythm and normal heart sounds.  Pulmonary/Chest: Effort normal and breath sounds normal.  Abdominal: Soft. Bowel sounds are normal.  Musculoskeletal: Normal range of motion. He exhibits no edema, tenderness or deformity.  Lymphadenopathy:    He has no cervical adenopathy.  Neurological: He is alert and oriented to person, place, and time.  Skin: Skin is warm and dry. No rash noted. No erythema.  Psychiatric: He has a normal mood and affect. His behavior is normal. Judgment and thought content normal.  Vitals reviewed.    Lab Results  Component Value Date   WBC 2.5 (L) 08/10/2017   HGB 11.6 (L) 08/10/2017   HCT 34.6 (L) 08/10/2017   MCV 90.3 08/10/2017   PLT 65 (L) 08/10/2017     Chemistry      Component Value Date/Time   NA 141 08/08/2017 0907   K 4.0 08/08/2017 0907   CL  110 08/08/2017 0907   CO2 26 08/08/2017 0907   BUN 10 08/08/2017 0907   CREATININE 0.76 08/08/2017 0907      Component Value Date/Time   CALCIUM 8.6 08/08/2017 0907   ALKPHOS 69 08/08/2017 0907   AST 47 (H) 08/08/2017 0907   ALT 22 08/08/2017 0907   BILITOT 1.1 08/08/2017 0907       Impression and Plan: Mr. Sipos is a 77 year old white male.  We have been following him now for several years.  So far, there really has not been any obvious change with his blood counts.  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  I am not yet done a bone marrow biopsy on him as I have not felt that a bone marrow biopsy would change  your management recommendations right now.  We will plan to get him back in 4 months.  I would like to see him back after Thanksgiving.  I told him to make sure that he drinks a lot of water.  I also make sure that he wears sunscreen.    Volanda Napoleon, MD 7/18/20198:42 AM

## 2017-08-10 NOTE — Telephone Encounter (Signed)
See result note. Results given on 08/10/17

## 2017-08-10 NOTE — Telephone Encounter (Signed)
Copied from [place] [phone]. Topic: Inquiry >> Aug 10, 2017 10:57 AM Pricilla Handler wrote: Reason for CRM: Patient called back for his lab results. A Triage Nurse was not available at the time.       Thank You!!!

## 2017-08-11 LAB — PROTEIN ELECTROPHORESIS, SERUM, WITH REFLEX
A/G RATIO SPE: 0.9 (ref 0.7–1.7)
ALPHA-2-GLOBULIN: 0.4 g/dL (ref 0.4–1.0)
Albumin ELP: 2.7 g/dL — ABNORMAL LOW (ref 2.9–4.4)
Alpha-1-Globulin: 0.2 g/dL (ref 0.0–0.4)
Beta Globulin: 0.9 g/dL (ref 0.7–1.3)
GLOBULIN, TOTAL: 2.9 g/dL (ref 2.2–3.9)
Gamma Globulin: 1.5 g/dL (ref 0.4–1.8)
Total Protein ELP: 5.6 g/dL — ABNORMAL LOW (ref 6.0–8.5)

## 2017-08-11 LAB — KAPPA/LAMBDA LIGHT CHAINS
KAPPA FREE LGHT CHN: 45.9 mg/L — AB (ref 3.3–19.4)
KAPPA, LAMDA LIGHT CHAIN RATIO: 1.34 (ref 0.26–1.65)
LAMDA FREE LIGHT CHAINS: 34.3 mg/L — AB (ref 5.7–26.3)

## 2017-08-11 LAB — IGG, IGA, IGM
IgA: 384 mg/dL (ref 61–437)
IgG (Immunoglobin G), Serum: 1296 mg/dL (ref 700–1600)
IgM (Immunoglobulin M), Srm: 132 mg/dL (ref 15–143)

## 2017-08-21 ENCOUNTER — Other Ambulatory Visit (INDEPENDENT_AMBULATORY_CARE_PROVIDER_SITE_OTHER): Payer: Medicare Other

## 2017-08-21 DIAGNOSIS — R7401 Elevation of levels of liver transaminase levels: Secondary | ICD-10-CM

## 2017-08-21 DIAGNOSIS — R740 Nonspecific elevation of levels of transaminase and lactic acid dehydrogenase [LDH]: Secondary | ICD-10-CM

## 2017-08-21 LAB — COMPREHENSIVE METABOLIC PANEL
ALT: 18 U/L (ref 0–53)
AST: 29 U/L (ref 0–37)
Albumin: 2.8 g/dL — ABNORMAL LOW (ref 3.5–5.2)
Alkaline Phosphatase: 64 U/L (ref 39–117)
BUN: 10 mg/dL (ref 6–23)
CO2: 24 meq/L (ref 19–32)
Calcium: 8.7 mg/dL (ref 8.4–10.5)
Chloride: 112 mEq/L (ref 96–112)
Creatinine, Ser: 0.86 mg/dL (ref 0.40–1.50)
GFR: 92.04 mL/min (ref >60.00)
Glucose, Bld: 125 mg/dL — ABNORMAL HIGH (ref 70–99)
Potassium: 3.9 mEq/L (ref 3.5–5.1)
Sodium: 143 mEq/L (ref 135–145)
Total Bilirubin: 1.3 mg/dL — ABNORMAL HIGH (ref 0.2–1.2)
Total Protein: 5.5 g/dL — ABNORMAL LOW (ref 6.0–8.3)

## 2017-09-06 ENCOUNTER — Ambulatory Visit: Payer: Medicare Other

## 2018-01-04 ENCOUNTER — Other Ambulatory Visit: Payer: Self-pay

## 2018-01-04 ENCOUNTER — Inpatient Hospital Stay: Payer: Medicare Other | Attending: Hematology & Oncology | Admitting: Hematology & Oncology

## 2018-01-04 ENCOUNTER — Inpatient Hospital Stay: Payer: Medicare Other

## 2018-01-04 ENCOUNTER — Encounter: Payer: Self-pay | Admitting: Hematology & Oncology

## 2018-01-04 VITALS — BP 156/84 | HR 83 | Temp 97.9°F | Resp 18 | Wt 206.0 lb

## 2018-01-04 DIAGNOSIS — D696 Thrombocytopenia, unspecified: Secondary | ICD-10-CM

## 2018-01-04 DIAGNOSIS — D7381 Neutropenic splenomegaly: Secondary | ICD-10-CM

## 2018-01-04 DIAGNOSIS — K7460 Unspecified cirrhosis of liver: Secondary | ICD-10-CM

## 2018-01-04 DIAGNOSIS — T451X5A Adverse effect of antineoplastic and immunosuppressive drugs, initial encounter: Secondary | ICD-10-CM

## 2018-01-04 DIAGNOSIS — Z7982 Long term (current) use of aspirin: Secondary | ICD-10-CM

## 2018-01-04 DIAGNOSIS — D72819 Decreased white blood cell count, unspecified: Secondary | ICD-10-CM | POA: Diagnosis not present

## 2018-01-04 DIAGNOSIS — K219 Gastro-esophageal reflux disease without esophagitis: Secondary | ICD-10-CM

## 2018-01-04 DIAGNOSIS — Z79899 Other long term (current) drug therapy: Secondary | ICD-10-CM

## 2018-01-04 DIAGNOSIS — D701 Agranulocytosis secondary to cancer chemotherapy: Secondary | ICD-10-CM

## 2018-01-04 LAB — SAVE SMEAR

## 2018-01-04 LAB — CBC WITH DIFFERENTIAL (CANCER CENTER ONLY)
ABS IMMATURE GRANULOCYTES: 0.01 10*3/uL (ref 0.00–0.07)
BASOS ABS: 0 10*3/uL (ref 0.0–0.1)
Basophils Relative: 1 %
Eosinophils Absolute: 0.1 10*3/uL (ref 0.0–0.5)
Eosinophils Relative: 4 %
HEMATOCRIT: 39.1 % (ref 39.0–52.0)
HEMOGLOBIN: 12.8 g/dL — AB (ref 13.0–17.0)
IMMATURE GRANULOCYTES: 0 %
LYMPHS ABS: 1.1 10*3/uL (ref 0.7–4.0)
LYMPHS PCT: 37 %
MCH: 31.8 pg (ref 26.0–34.0)
MCHC: 32.7 g/dL (ref 30.0–36.0)
MCV: 97.3 fL (ref 80.0–100.0)
Monocytes Absolute: 0.3 10*3/uL (ref 0.1–1.0)
Monocytes Relative: 11 %
NRBC: 0 % (ref 0.0–0.2)
Neutro Abs: 1.4 10*3/uL — ABNORMAL LOW (ref 1.7–7.7)
Neutrophils Relative %: 47 %
PLATELETS: 64 10*3/uL — AB (ref 150–400)
RBC: 4.02 MIL/uL — AB (ref 4.22–5.81)
RDW: 15 % (ref 11.5–15.5)
WBC: 3 10*3/uL — AB (ref 4.0–10.5)

## 2018-01-04 LAB — CMP (CANCER CENTER ONLY)
ALT: 22 U/L (ref 0–44)
AST: 34 U/L (ref 15–41)
Albumin: 3 g/dL — ABNORMAL LOW (ref 3.5–5.0)
Alkaline Phosphatase: 75 U/L (ref 38–126)
Anion gap: 6 (ref 5–15)
BUN: 9 mg/dL (ref 8–23)
CO2: 25 mmol/L (ref 22–32)
Calcium: 8.4 mg/dL — ABNORMAL LOW (ref 8.9–10.3)
Chloride: 110 mmol/L (ref 98–111)
Creatinine: 0.73 mg/dL (ref 0.61–1.24)
GFR, Estimated: >60 mL/min (ref >60)
Glucose, Bld: 85 mg/dL (ref 70–99)
Potassium: 4.2 mmol/L (ref 3.5–5.1)
Sodium: 141 mmol/L (ref 135–145)
TOTAL PROTEIN: 5.8 g/dL — AB (ref 6.5–8.1)
Total Bilirubin: 1.5 mg/dL — ABNORMAL HIGH (ref 0.3–1.2)

## 2018-01-04 LAB — PLATELET BY CITRATE

## 2018-01-04 NOTE — Progress Notes (Signed)
Hematology and Oncology Follow Up Visit  Steve Watson [id number] 1940/08/08 77 y.o. 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  He is doing pretty well.  He was last seen back in July.  He had no problems during the summer or fall.  He had a good Thanksgiving.  There is been no bleeding.  He has had no bruising.  He has had no abdominal pain.  There is been no change in bowel or bladder habits.  Overall, his performance status is ECOG 1 1  Medications:  Current Outpatient Medications:  .  aspirin 81 MG tablet, Take 81 mg by mouth daily., Disp: , Rfl:  .  calcium & magnesium carbonates (MYLANTA) 311-232 MG per tablet, Take 1 tablet by mouth daily., Disp: , Rfl:  .  Cholecalciferol 2000 UNITS TABS, Take 1 tablet (2,000 Units total) by mouth daily at 12 noon., Disp: 30 each, Rfl: 2 .  Multiple Vitamins-Minerals (VISION VITAMINS) TABS, Take 2 tablets by mouth daily., Disp: , Rfl:  .  omeprazole (PRILOSEC) 10 MG capsule, Take 10 mg by mouth as needed. , Disp: , Rfl:   Allergies: No Known Allergies  Past Medical History, Surgical history, Social history, and Family History were reviewed and updated.  Review of Systems: Review of Systems  Constitutional: Negative.   HENT: Negative.   Eyes: Negative.   Respiratory: Negative.   Cardiovascular: Negative.   Gastrointestinal: Negative.   Genitourinary: Negative.   Musculoskeletal: Negative.   Skin: Negative.   Neurological: Negative.   Endo/Heme/Allergies: Negative.   Psychiatric/Behavioral: Negative.      Physical Exam:  weight is 206 lb (93.4 kg). His oral temperature is 97.9 F (36.6 C). His blood pressure is 156/84 (abnormal) and his pulse is 83. His respiration is 18 and oxygen saturation is 100%.   Wt Readings from Last 3 Encounters:  01/04/18 206 lb (93.4 kg)    08/10/17 204 lb (92.5 kg)  08/08/17 205 lb 6.4 oz (93.2 kg)     Physical Exam Vitals signs reviewed.  HENT:     Head: Normocephalic and atraumatic.  Eyes:     Pupils: Pupils are equal, round, and reactive to light.  Neck:     Musculoskeletal: Normal range of motion.  Cardiovascular:     Rate and Rhythm: Normal rate and regular rhythm.     Heart sounds: Normal heart sounds.  Pulmonary:     Effort: Pulmonary effort is normal.     Breath sounds: Normal breath sounds.  Abdominal:     General: Bowel sounds are normal.     Palpations: Abdomen is soft.  Musculoskeletal: Normal range of motion.        General: No tenderness or deformity.  Lymphadenopathy:     Cervical: No cervical adenopathy.  Skin:    General: Skin is warm and dry.     Findings: No erythema or rash.  Neurological:     Mental Status: He is alert and oriented to person, place, and time.  Psychiatric:        Behavior: Behavior normal.        Thought Content: Thought content normal.        Judgment: Judgment normal.      Lab Results  Component Value Date   WBC 2.5 (L) 08/10/2017   HGB 11.6 (L) 08/10/2017   HCT 34.6 (  L) 08/10/2017   MCV 90.3 08/10/2017   PLT 65 (L) 08/10/2017     Chemistry      Component Value Date/Time   NA 143 08/21/2017 0806   K 3.9 08/21/2017 0806   CL 112 08/21/2017 0806   CO2 24 08/21/2017 0806   BUN 10 08/21/2017 0806   CREATININE 0.86 08/21/2017 0806      Component Value Date/Time   CALCIUM 8.7 08/21/2017 0806   ALKPHOS 64 08/21/2017 0806   AST 29 08/21/2017 0806   ALT 18 08/21/2017 0806   BILITOT 1.3 (H) 08/21/2017 0806       Impression and Plan: Steve Watson is a 77 year old white male.  We have been following him now for several years.  So far, there really has not been any obvious change with his blood counts.  His liver does seem to be a little bit large.  I think that another abdominal ultrasound would not be a bad idea.  I talked to him about this.  He is  agreeable.  We will set the ultrasound up for next week.  I will plan to get him back in 6 months.  Again, his labs all seem to be pretty stable right now.  I told him that he needs to call us if he starts to notice any problems with bruising or bleeding.      Volanda Napoleon, MD 12/12/20198:06 AM

## 2018-01-09 ENCOUNTER — Ambulatory Visit (HOSPITAL_BASED_OUTPATIENT_CLINIC_OR_DEPARTMENT_OTHER)
Admission: RE | Admit: 2018-01-09 | Discharge: 2018-01-09 | Disposition: A | Discharge status: Home / Self Care | Payer: Medicare Other | Source: Ambulatory Visit | Attending: Hematology & Oncology | Admitting: Hematology & Oncology

## 2018-01-09 DIAGNOSIS — R162 Hepatomegaly with splenomegaly, not elsewhere classified: Secondary | ICD-10-CM | POA: Diagnosis not present

## 2018-01-09 DIAGNOSIS — N281 Cyst of kidney, acquired: Secondary | ICD-10-CM | POA: Diagnosis not present

## 2018-01-09 DIAGNOSIS — N200 Calculus of kidney: Secondary | ICD-10-CM | POA: Insufficient documentation

## 2018-01-09 DIAGNOSIS — Q619 Cystic kidney disease, unspecified: Secondary | ICD-10-CM | POA: Diagnosis not present

## 2018-01-09 DIAGNOSIS — K760 Fatty (change of) liver, not elsewhere classified: Secondary | ICD-10-CM | POA: Diagnosis not present

## 2018-01-09 DIAGNOSIS — D7381 Neutropenic splenomegaly: Secondary | ICD-10-CM | POA: Insufficient documentation

## 2018-01-11 ENCOUNTER — Telehealth: Payer: Self-pay | Admitting: *Deleted

## 2018-01-11 NOTE — Telephone Encounter (Addendum)
Patient is aware of results  ----- Message from Volanda Napoleon, MD sent at 01/10/2018  4:50 PM EST ----- Call - the spleen is a little big, but we do not have to do anything right now!!  Stay active!! Drink a lot of water.  Steve Watson

## 2018-07-06 ENCOUNTER — Inpatient Hospital Stay (HOSPITAL_BASED_OUTPATIENT_CLINIC_OR_DEPARTMENT_OTHER): Payer: Medicare Other | Admitting: Hematology & Oncology

## 2018-07-06 ENCOUNTER — Encounter: Payer: Self-pay | Admitting: Hematology & Oncology

## 2018-07-06 ENCOUNTER — Inpatient Hospital Stay: Payer: Medicare Other | Attending: Hematology & Oncology

## 2018-07-06 ENCOUNTER — Other Ambulatory Visit: Payer: Self-pay | Admitting: *Deleted

## 2018-07-06 ENCOUNTER — Other Ambulatory Visit: Payer: Self-pay

## 2018-07-06 VITALS — BP 163/83 | HR 89 | Temp 98.7°F | Resp 16 | Wt 216.0 lb

## 2018-07-06 DIAGNOSIS — R600 Localized edema: Secondary | ICD-10-CM | POA: Diagnosis not present

## 2018-07-06 DIAGNOSIS — D696 Thrombocytopenia, unspecified: Secondary | ICD-10-CM | POA: Insufficient documentation

## 2018-07-06 DIAGNOSIS — D7381 Neutropenic splenomegaly: Secondary | ICD-10-CM

## 2018-07-06 DIAGNOSIS — K7460 Unspecified cirrhosis of liver: Secondary | ICD-10-CM

## 2018-07-06 DIAGNOSIS — D72819 Decreased white blood cell count, unspecified: Secondary | ICD-10-CM | POA: Diagnosis not present

## 2018-07-06 DIAGNOSIS — K219 Gastro-esophageal reflux disease without esophagitis: Secondary | ICD-10-CM

## 2018-07-06 DIAGNOSIS — D700 Congenital agranulocytosis: Secondary | ICD-10-CM

## 2018-07-06 LAB — CMP (CANCER CENTER ONLY)
ALT: 15 U/L (ref 0–44)
AST: 28 U/L (ref 15–41)
Albumin: 2.9 g/dL — ABNORMAL LOW (ref 3.5–5.0)
Alkaline Phosphatase: 87 U/L (ref 38–126)
Anion gap: 8 (ref 5–15)
BUN: 7 mg/dL — ABNORMAL LOW (ref 8–23)
CO2: 20 mmol/L — ABNORMAL LOW (ref 22–32)
Calcium: 8.4 mg/dL — ABNORMAL LOW (ref 8.9–10.3)
Chloride: 113 mmol/L — ABNORMAL HIGH (ref 98–111)
Creatinine: 0.74 mg/dL (ref 0.61–1.24)
GFR, Est AFR Am: >60 mL/min (ref >60)
GFR, Estimated: >60 mL/min (ref >60)
Glucose, Bld: 105 mg/dL — ABNORMAL HIGH (ref 70–99)
Potassium: 3.7 mmol/L (ref 3.5–5.1)
Sodium: 141 mmol/L (ref 135–145)
Total Bilirubin: 2.2 mg/dL — ABNORMAL HIGH (ref 0.3–1.2)
Total Protein: 5.6 g/dL — ABNORMAL LOW (ref 6.5–8.1)

## 2018-07-06 LAB — IRON AND TIBC
Iron: 143 ug/dL (ref 42–163)
Saturation Ratios: 47 % (ref 20–55)
TIBC: 303 ug/dL (ref 202–409)
UIBC: 159 ug/dL (ref 117–376)

## 2018-07-06 LAB — CBC WITH DIFFERENTIAL (CANCER CENTER ONLY)
Abs Immature Granulocytes: 0.01 10*3/uL (ref 0.00–0.07)
Basophils Absolute: 0 10*3/uL (ref 0.0–0.1)
Basophils Relative: 2 %
Eosinophils Absolute: 0.1 10*3/uL (ref 0.0–0.5)
Eosinophils Relative: 5 %
HCT: 37.6 % — ABNORMAL LOW (ref 39.0–52.0)
Hemoglobin: 12.8 g/dL — ABNORMAL LOW (ref 13.0–17.0)
Immature Granulocytes: 0 %
Lymphocytes Relative: 33 %
Lymphs Abs: 0.9 10*3/uL (ref 0.7–4.0)
MCH: 33.4 pg (ref 26.0–34.0)
MCHC: 34 g/dL (ref 30.0–36.0)
MCV: 98.2 fL (ref 80.0–100.0)
Monocytes Absolute: 0.3 10*3/uL (ref 0.1–1.0)
Monocytes Relative: 11 %
Neutro Abs: 1.3 10*3/uL — ABNORMAL LOW (ref 1.7–7.7)
Neutrophils Relative %: 49 %
Platelet Count: 56 10*3/uL — ABNORMAL LOW (ref 150–400)
RBC: 3.83 MIL/uL — ABNORMAL LOW (ref 4.22–5.81)
RDW: 14.9 % (ref 11.5–15.5)
WBC Count: 2.7 10*3/uL — ABNORMAL LOW (ref 4.0–10.5)
nRBC: 0 % (ref 0.0–0.2)

## 2018-07-06 LAB — FERRITIN: Ferritin: 23 ng/mL — ABNORMAL LOW (ref 24–336)

## 2018-07-06 MED ORDER — SPIRONOLACTONE 50 MG PO TABS: 50.0000 mg | ORAL_TABLET | Freq: Every day | ORAL | 4 refills | Status: DC

## 2018-07-06 NOTE — Progress Notes (Signed)
Hematology and Oncology Follow Up Visit  Steve Watson [id number] 24-Sep-1940 78 y.o. 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  He has had little change in his status.  He has has significant edema in his legs right now.  He said this is about a week or so.  This is all reflective of his cirrhosis.  He is only 2.9.  I will start him on Aldactone (50 mg p.o. daily).  I told his family doctor will have to help out with this.  I will let Dr. Hassell Done know what is going on.  He is had no bleeding or bruising.  He has had no fever.  He has had no cough.  He has had no change in bowel or bladder habits.  There is been no rashes.  Overall, his performance status is ECOG 1.     Medications:  Current Outpatient Medications:  .  aspirin 81 MG tablet, Take 81 mg by mouth daily., Disp: , Rfl:  .  calcium & magnesium carbonates (MYLANTA) 311-232 MG per tablet, Take 1 tablet by mouth daily., Disp: , Rfl:  .  Cholecalciferol 2000 UNITS TABS, Take 1 tablet (2,000 Units total) by mouth daily at 12 noon., Disp: 30 each, Rfl: 2 .  Multiple Vitamins-Minerals (VISION VITAMINS) TABS, Take 2 tablets by mouth daily., Disp: , Rfl:  .  omeprazole (PRILOSEC) 10 MG capsule, Take 10 mg by mouth as needed. , Disp: , Rfl:   Allergies: No Known Allergies  Past Medical History, Surgical history, Social history, and Family History were reviewed and updated.  Review of Systems: Review of Systems  Constitutional: Negative.   HENT: Negative.   Eyes: Negative.   Respiratory: Negative.   Cardiovascular: Negative.   Gastrointestinal: Negative.   Genitourinary: Negative.   Musculoskeletal: Negative.   Skin: Negative.   Neurological: Negative.   Endo/Heme/Allergies: Negative.   Psychiatric/Behavioral: Negative.      Physical Exam:  weight is 216 lb (98  kg). His oral temperature is 98.7 F (37.1 C). His blood pressure is 163/83 (abnormal) and his pulse is 89. His respiration is 16 and oxygen saturation is 100%.   Wt Readings from Last 3 Encounters:  07/06/18 216 lb (98 kg)  01/04/18 206 lb (93.4 kg)  08/10/17 204 lb (92.5 kg)     Physical Exam Vitals signs reviewed.  HENT:     Head: Normocephalic and atraumatic.  Eyes:     Pupils: Pupils are equal, round, and reactive to light.  Neck:     Musculoskeletal: Normal range of motion.  Cardiovascular:     Rate and Rhythm: Normal rate and regular rhythm.     Heart sounds: Normal heart sounds.  Pulmonary:     Effort: Pulmonary effort is normal.     Breath sounds: Normal breath sounds.  Abdominal:     General: Bowel sounds are normal.     Palpations: Abdomen is soft.  Musculoskeletal: Normal range of motion.        General: No tenderness or deformity.  Lymphadenopathy:     Cervical: No cervical adenopathy.  Skin:    General: Skin is warm and dry.     Findings: No erythema or rash.  Neurological:     Mental Status: He is alert and oriented to person, place, and time.  Psychiatric:  Behavior: Behavior normal.        Thought Content: Thought content normal.        Judgment: Judgment normal.      Lab Results  Component Value Date   WBC 2.7 (L) 07/06/2018   HGB 12.8 (L) 07/06/2018   HCT 37.6 (L) 07/06/2018   MCV 98.2 07/06/2018   PLT 56 (L) 07/06/2018     Chemistry      Component Value Date/Time   NA 141 07/06/2018 0812   K 3.7 07/06/2018 0812   CL 113 (H) 07/06/2018 0812   CO2 20 (L) 07/06/2018 0812   BUN 7 (L) 07/06/2018 0812   CREATININE 0.74 07/06/2018 0812      Component Value Date/Time   CALCIUM 8.4 (L) 07/06/2018 0812   ALKPHOS 87 07/06/2018 0812   AST 28 07/06/2018 0812   ALT 15 07/06/2018 0812   BILITOT 2.2 (H) 07/06/2018 0812       Impression and Plan: Steve Watson is a 78 year old white male.  We have been following him now for several  years.  So far, there really has not been any obvious change with his blood counts.  Of note, we did do an ultrasound of his abdomen back in December.  This did show some cirrhotic changes.  He had splenomegaly.  There is goes along with some issues with portal hypertension.  I want to see him back in 3 months now.  Again, there is been no change in status I want to make sure that we follow-up with this.        Volanda Napoleon, MD 6/12/20208:56 AM

## 2018-07-09 ENCOUNTER — Telehealth: Payer: Self-pay | Admitting: Physician Assistant

## 2018-07-09 ENCOUNTER — Ambulatory Visit: Payer: Medicare Other | Admitting: Physician Assistant

## 2018-07-09 NOTE — Telephone Encounter (Signed)
-----   Message from Brunetta Jeans, PA-C sent at 07/06/2018  4:07 PM EDT ----- Yes if he passes COVID screening. Thank you! ----- Message ----- From: Rosebud Poles Sent: 07/06/2018   4:01 PM EDT To: Brunetta Jeans, PA-C  Would you like this to be an in office visit? ----- Message ----- From: Delorse Limber Sent: 07/06/2018   3:50 PM EDT To: Rosebud Poles  Can we call patient to schedule him for a follow-up of leg swelling this coming week? Thank you.  ----- Message ----- From: Volanda Napoleon, MD Sent: 07/06/2018   9:16 AM EDT To: Brunetta Jeans, PA-C  [place]:  Mr. Reason has a lot of edema in his legs right now.  I believe this is from his liver issues.  His albumin is only 2.9.I started him on Aldactone at 50 mg p.o. daily.  I was hoping that you could follow-up with him fairly quickly and help manage the peripheral edema in his legs.  Thanks for all your help.  Have a very blessed weekend.  Laurey Arrow

## 2018-07-09 NOTE — Telephone Encounter (Signed)
LMOVM asking for pt to call our office to schedule an appt.

## 2018-07-12 ENCOUNTER — Other Ambulatory Visit: Payer: Self-pay

## 2018-07-12 ENCOUNTER — Encounter: Payer: Self-pay | Admitting: Physician Assistant

## 2018-07-12 ENCOUNTER — Ambulatory Visit (INDEPENDENT_AMBULATORY_CARE_PROVIDER_SITE_OTHER): Payer: Medicare Other | Admitting: Physician Assistant

## 2018-07-12 VITALS — BP 130/78 | HR 99 | Temp 99.1°F | Resp 16 | Ht 67.75 in | Wt 213.8 lb

## 2018-07-12 DIAGNOSIS — R609 Edema, unspecified: Secondary | ICD-10-CM

## 2018-07-12 MED ORDER — FUROSEMIDE 20 MG PO TABS: 20.0000 mg | ORAL_TABLET | Freq: Every day | ORAL | 0 refills | Status: DC

## 2018-07-12 NOTE — Patient Instructions (Signed)
Please work on limiting salt intake. Even though you are not adding table salt to your meals you are getting in a lot of salt just with your food choices. See dietary recommendations below. Keep hydrated with water.  Continue the Spironolactone that Dr. Marin Olp gave you for now.  Add on the Fluid pill once daily from now through Monday that I have given. We will follow-up via the phone on Monday to see how things are going and to make further adjustments.   DASH Eating Plan DASH stands for "Dietary Approaches to Stop Hypertension." The DASH eating plan is a healthy eating plan that has been shown to reduce high blood pressure (hypertension). It may also reduce your risk for type 2 diabetes, heart disease, and stroke. The DASH eating plan may also help with weight loss. What are tips for following this plan?  General guidelines  Avoid eating more than 2,300 mg (milligrams) of salt (sodium) a day. If you have hypertension, you may need to reduce your sodium intake to 1,500 mg a day.  Limit alcohol intake to no more than 1 drink a day for nonpregnant women and 2 drinks a day for men. One drink equals 12 oz of beer, 5 oz of wine, or 1 oz of hard liquor.  Work with your health care provider to maintain a healthy body weight or to lose weight. Ask what an ideal weight is for you.  Get at least 30 minutes of exercise that causes your heart to beat faster (aerobic exercise) most days of the week. Activities may include walking, swimming, or biking.  Work with your health care provider or diet and nutrition specialist (dietitian) to adjust your eating plan to your individual calorie needs. Reading food labels   Check food labels for the amount of sodium per serving. Choose foods with less than 5 percent of the Daily Value of sodium. Generally, foods with less than 300 mg of sodium per serving fit into this eating plan.  To find whole grains, look for the word "whole" as the first word in the  ingredient list. Shopping  Buy products labeled as "low-sodium" or "no salt added."  Buy fresh foods. Avoid canned foods and premade or frozen meals. Cooking  Avoid adding salt when cooking. Use salt-free seasonings or herbs instead of table salt or sea salt. Check with your health care provider or pharmacist before using salt substitutes.  Do not fry foods. Cook foods using healthy methods such as baking, boiling, grilling, and broiling instead.  Cook with heart-healthy oils, such as olive, canola, soybean, or sunflower oil. Meal planning  Eat a balanced diet that includes: ? 5 or more servings of fruits and vegetables each day. At each meal, try to fill half of your plate with fruits and vegetables. ? Up to 6-8 servings of whole grains each day. ? Less than 6 oz of lean meat, poultry, or fish each day. A 3-oz serving of meat is about the same size as a deck of cards. One egg equals 1 oz. ? 2 servings of low-fat dairy each day. ? A serving of nuts, seeds, or beans 5 times each week. ? Heart-healthy fats. Healthy fats called Omega-3 fatty acids are found in foods such as flaxseeds and coldwater fish, like sardines, salmon, and mackerel.  Limit how much you eat of the following: ? Canned or prepackaged foods. ? Food that is high in trans fat, such as fried foods. ? Food that is high in saturated fat, such  as fatty meat. ? Sweets, desserts, sugary drinks, and other foods with added sugar. ? Full-fat dairy products.  Do not salt foods before eating.  Try to eat at least 2 vegetarian meals each week.  Eat more home-cooked food and less restaurant, buffet, and fast food.  When eating at a restaurant, ask that your food be prepared with less salt or no salt, if possible. What foods are recommended? The items listed may not be a complete list. Talk with your dietitian about what dietary choices are best for you. Grains Whole-grain or whole-wheat bread. Whole-grain or whole-wheat  pasta. Brown rice. [place]. Bulgur. Whole-grain and low-sodium cereals. Pita bread. Low-fat, low-sodium crackers. Whole-wheat flour tortillas. Vegetables Fresh or frozen vegetables (raw, steamed, roasted, or grilled). Low-sodium or reduced-sodium tomato and vegetable juice. Low-sodium or reduced-sodium tomato sauce and tomato paste. Low-sodium or reduced-sodium canned vegetables. Fruits All fresh, dried, or frozen fruit. Canned fruit in natural juice (without added sugar). Meat and other protein foods Skinless chicken or [place]. Ground chicken or [place]. Pork with fat trimmed off. Fish and seafood. Egg whites. Dried beans, peas, or lentils. Unsalted nuts, nut butters, and seeds. Unsalted canned beans. Lean cuts of beef with fat trimmed off. Low-sodium, lean deli meat. Dairy Low-fat (1%) or fat-free (skim) milk. Fat-free, low-fat, or reduced-fat cheeses. Nonfat, low-sodium ricotta or cottage cheese. Low-fat or nonfat yogurt. Low-fat, low-sodium cheese. Fats and oils Soft margarine without trans fats. Vegetable oil. Low-fat, reduced-fat, or light mayonnaise and salad dressings (reduced-sodium). Canola, safflower, olive, soybean, and sunflower oils. Avocado. Seasoning and other foods Herbs. Spices. Seasoning mixes without salt. Unsalted popcorn and pretzels. Fat-free sweets. What foods are not recommended? The items listed may not be a complete list. Talk with your dietitian about what dietary choices are best for you. Grains Baked goods made with fat, such as croissants, muffins, or some breads. Dry pasta or rice meal packs. Vegetables Creamed or fried vegetables. Vegetables in a cheese sauce. Regular canned vegetables (not low-sodium or reduced-sodium). Regular canned tomato sauce and paste (not low-sodium or reduced-sodium). Regular tomato and vegetable juice (not low-sodium or reduced-sodium). Angie Fava. Olives. Fruits Canned fruit in a light or heavy syrup. Fried fruit. Fruit in cream or  butter sauce. Meat and other protein foods Fatty cuts of meat. Ribs. Fried meat. Berniece Salines. Sausage. Bologna and other processed lunch meats. Salami. Fatback. Hotdogs. Bratwurst. Salted nuts and seeds. Canned beans with added salt. Canned or smoked fish. Whole eggs or egg yolks. Chicken or [place] with skin. Dairy Whole or 2% milk, cream, and half-and-half. Whole or full-fat cream cheese. Whole-fat or sweetened yogurt. Full-fat cheese. Nondairy creamers. Whipped toppings. Processed cheese and cheese spreads. Fats and oils Butter. Stick margarine. Lard. Shortening. Ghee. Bacon fat. Tropical oils, such as coconut, palm kernel, or palm oil. Seasoning and other foods Salted popcorn and pretzels. Onion salt, garlic salt, seasoned salt, table salt, and sea salt. Worcestershire sauce. Tartar sauce. Barbecue sauce. Teriyaki sauce. Soy sauce, including reduced-sodium. Steak sauce. Canned and packaged gravies. Fish sauce. Oyster sauce. Cocktail sauce. Horseradish that you find on the shelf. Ketchup. Mustard. Meat flavorings and tenderizers. Bouillon cubes. Hot sauce and Tabasco sauce. Premade or packaged marinades. Premade or packaged taco seasonings. Relishes. Regular salad dressings. Where to find more information:  National Heart, Lung, and [place]: https://wilson-eaton.com/  American Heart Association: www.heart.org Summary  The DASH eating plan is a healthy eating plan that has been shown to reduce high blood pressure (hypertension). It may also reduce your risk  for type 2 diabetes, heart disease, and stroke.  With the DASH eating plan, you should limit salt (sodium) intake to 2,300 mg a day. If you have hypertension, you may need to reduce your sodium intake to 1,500 mg a day.  When on the DASH eating plan, aim to eat more fresh fruits and vegetables, whole grains, lean proteins, low-fat dairy, and heart-healthy fats.  Work with your health care provider or diet and nutrition specialist (dietitian) to  adjust your eating plan to your individual calorie needs. This information is not intended to replace advice given to you by your health care provider. Make sure you discuss any questions you have with your health care provider. Document Released: 12/30/2010 Document Revised: 01/04/2016 Document Reviewed: 01/04/2016 Elsevier Interactive Patient Education  2019 [employer].

## 2018-07-12 NOTE — Progress Notes (Signed)
Patient presents to clinic today for follow-up regarding peripheral edema. Patient was seen last week by Hematology (Dr. Marin Olp) at which time pitting edema noted. Was started on Spironolactone 50 mg daily by Dr. Marin Olp and instructed to follow-up here for reassessment. Patient endorses taking medication as directed, tolerating well without side effect. Notes some improvement in swelling. Denies SOB, PND, Orthopnea, palpitations, lightheadedness or dizziness. Does not add any salt to food but on review of a 24 hour food intake, he is eating bacon most mornings, pasta or fried food at lunch and a burger/fries for dinner a few times per week. Patient denies any pain in the legs. Denies weeping or skin changes.   Past Medical History:  Diagnosis Date  . Chicken pox   . GERD (gastroesophageal reflux disease)   . 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 aspirin 81 MG tablet Take 81 mg by mouth daily.    . calcium & magnesium carbonates (MYLANTA) 311-232 MG per tablet Take 1 tablet by mouth daily.    . Cholecalciferol 2000 UNITS TABS Take 1 tablet (2,000 Units total) by mouth daily at 12 noon. 30 each 2  . Multiple Vitamins-Minerals (VISION VITAMINS) TABS Take 2 tablets by mouth daily.    Marland Kitchen omeprazole (PRILOSEC) 10 MG capsule Take 10 mg by mouth as needed.     Marland Kitchen spironolactone (ALDACTONE) 50 MG tablet Take 1 tablet (50 mg total) by mouth daily. 30 tablet 4   No current facility-administered medications on file prior to visit.     No Known Allergies  Family History  Problem Relation Age of Onset  . Hypertension Mother   . Hypertension Father   . Hypertension Brother   . Diabetes Brother   . Healthy Child   . Healthy Grandchild     Social History   Socioeconomic History  . Marital status: Married    Spouse name: Not on file  . Number of children: Not on file  . Years of  education: Not on file  . Highest education level: Not on file  Occupational History  . Not on file  Social Needs  . Financial resource strain: Not on file  . Food insecurity    Worry: Not on file    Inability: Not on file  . Transportation needs    Medical: Not on file    Non-medical: Not on file  Tobacco Use  . Smoking status: Never Smoker  . Smokeless tobacco: Never Used  . Tobacco comment: NEVER USED TOBACCO  Substance and Sexual Activity  . Alcohol use: No    Alcohol/week: 0.0 standard drinks  . Drug use: No  . Sexual activity: Yes  Lifestyle  . Physical activity    Days per week: Not on file    Minutes per session: Not on file  . Stress: Not on file  Relationships  . Social Herbalist on phone: Not on file    Gets together: Not on file    Attends religious service: Not on file    Active member of club or organization: Not on file    Attends meetings of clubs or organizations: Not on file    Relationship status: Not on file  Other Topics Concern  . Not on file  Social History Narrative  . Not on file   Review of Systems - See HPI.  All other ROS are  negative.  BP 130/78   Pulse 99   Temp 99.1 F (37.3 C) (Skin)   Resp 16   Ht 5' 7.75" (1.721 m)   Wt 213 lb 12.8 oz (97 kg)   SpO2 98%   BMI 32.75 kg/m   Physical Exam Vitals signs reviewed.  Constitutional:      Appearance: Normal appearance.  HENT:     Head: Normocephalic and atraumatic.  Neck:     Musculoskeletal: Neck supple.  Cardiovascular:     Rate and Rhythm: Normal rate and regular rhythm.     Pulses: Normal pulses.     Heart sounds: Normal heart sounds.  Pulmonary:     Effort: Pulmonary effort is normal.  Musculoskeletal:     Right lower leg: 2+ Edema present.     Left lower leg: 2+ Edema present.  Neurological:     General: No focal deficit present.     Mental Status: He is alert and oriented to person, place, and time.  Psychiatric:        Mood and Affect: Mood normal.      Recent Results (from the past 2160 hour(s))  CBC with Differential ([place] Only)     Status: Abnormal   Collection Time: 07/06/18  8:12 AM  Result Value Ref Range   WBC Count 2.7 (L) 4.0 - 10.5 K/uL   RBC 3.83 (L) 4.22 - 5.81 MIL/uL   Hemoglobin 12.8 (L) 13.0 - 17.0 g/dL   HCT 37.6 (L) 39.0 - 52.0 %   MCV 98.2 80.0 - 100.0 fL   MCH 33.4 26.0 - 34.0 pg   MCHC 34.0 30.0 - 36.0 g/dL   RDW 14.9 11.5 - 15.5 %   Platelet Count 56 (L) 150 - 400 K/uL   nRBC 0.0 0.0 - 0.2 %   Neutrophils Relative % 49 %   Neutro Abs 1.3 (L) 1.7 - 7.7 K/uL   Lymphocytes Relative 33 %   Lymphs Abs 0.9 0.7 - 4.0 K/uL   Monocytes Relative 11 %   Monocytes Absolute 0.3 0.1 - 1.0 K/uL   Eosinophils Relative 5 %   Eosinophils Absolute 0.1 0.0 - 0.5 K/uL   Basophils Relative 2 %   Basophils Absolute 0.0 0.0 - 0.1 K/uL   Immature Granulocytes 0 %   Abs Immature Granulocytes 0.01 0.00 - 0.07 K/uL    Comment: Performed at [facility] at [facility], [address], [place], [place] [address]  CMP ([place] only)     Status: Abnormal   Collection Time: 07/06/18  8:12 AM  Result Value Ref Range   Sodium 141 135 - 145 mmol/L   Potassium 3.7 3.5 - 5.1 mmol/L   Chloride 113 (H) 98 - 111 mmol/L   CO2 20 (L) 22 - 32 mmol/L   Glucose, Bld 105 (H) 70 - 99 mg/dL   BUN 7 (L) 8 - 23 mg/dL   Creatinine 0.74 0.61 - 1.24 mg/dL   Calcium 8.4 (L) 8.9 - 10.3 mg/dL   Total Protein 5.6 (L) 6.5 - 8.1 g/dL   Albumin 2.9 (L) 3.5 - 5.0 g/dL   AST 28 15 - 41 U/L   ALT 15 0 - 44 U/L   Alkaline Phosphatase 87 38 - 126 U/L   Total Bilirubin 2.2 (H) 0.3 - 1.2 mg/dL   GFR, Est Non Af Am >60 >60 mL/min   GFR, Est AFR Am >60 >60 mL/min   Anion gap 8 5 -  15    Comment: Performed at [facility] at [facility], [address], [place], [place] [address]  Iron and TIBC     Status: None   Collection Time: 07/06/18  8:12 AM  Result Value Ref Range   Iron 143 42 - 163  ug/dL   TIBC 303 202 - 409 ug/dL   Saturation Ratios 47 20 - 55 %   UIBC 159 117 - 376 ug/dL    Comment: Performed at [facility], [place] [address], [place], [place] [address]  Ferritin     Status: Abnormal   Collection Time: 07/06/18  8:12 AM  Result Value Ref Range   Ferritin 23 (L) 24 - 336 ng/mL    Comment: Performed at [facility], [place] [address], [place], [place] [address]   Assessment/Plan: 1. Peripheral edema Improved from check with Hematology per patient. Still significant edema (2+) without venous dermatitis or other skin changes. Will have him continue Spironolactone at current dose for now, adding on Lasix 20 mg daily over the weekend. Dietary counseling reviewed in detail. He is to start DASH diet. Close check on swelling and weight. Follow-up Monday for repeat assessment.    Leeanne Rio, PA-C

## 2018-07-19 ENCOUNTER — Telehealth: Payer: Self-pay | Admitting: *Deleted

## 2018-07-19 NOTE — Telephone Encounter (Signed)
Patient called in and said that "doctor Hassell Done" was supposed to call him on Monday to check on him and he didn't do it.   Pt states he was seen on the 18th for edema.  Patient reports that he is doing much better.  Edema is almost completely gone.   Pt states he has no other issues/concerns, just wants me to tell how he's currently doing.

## 2018-07-20 NOTE — Telephone Encounter (Signed)
Thank you for making me aware. Have him continue just the spironolactone Dr. Marin Olp has given him. Again watch salt intake. Would like him to keep close eye on things. If swelling recurring he is to let us know. Recommend repeat BMP in 1 week from today.

## 2018-07-20 NOTE — Telephone Encounter (Signed)
Spoke with patient to make him aware. Stated verbal understanding. Lab appointment made for next week.

## 2018-07-26 ENCOUNTER — Ambulatory Visit (INDEPENDENT_AMBULATORY_CARE_PROVIDER_SITE_OTHER): Payer: Medicare Other

## 2018-07-26 ENCOUNTER — Other Ambulatory Visit: Payer: Self-pay

## 2018-07-26 DIAGNOSIS — R609 Edema, unspecified: Secondary | ICD-10-CM

## 2018-07-26 LAB — BASIC METABOLIC PANEL
BUN: 11 mg/dL (ref 6–23)
CO2: 23 mEq/L (ref 19–32)
Calcium: 8.6 mg/dL (ref 8.4–10.5)
Chloride: 104 mEq/L (ref 96–112)
Creatinine, Ser: 0.86 mg/dL (ref 0.40–1.50)
GFR: 86.39 mL/min (ref >60.00)
Glucose, Bld: 107 mg/dL — ABNORMAL HIGH (ref 70–99)
Potassium: 4.2 mEq/L (ref 3.5–5.1)
Sodium: 135 mEq/L (ref 135–145)

## 2018-10-05 ENCOUNTER — Inpatient Hospital Stay: Payer: Medicare Other | Attending: Hematology & Oncology | Admitting: Hematology & Oncology

## 2018-10-05 ENCOUNTER — Inpatient Hospital Stay: Payer: Medicare Other

## 2018-10-05 ENCOUNTER — Encounter: Payer: Self-pay | Admitting: Hematology & Oncology

## 2018-10-05 ENCOUNTER — Other Ambulatory Visit: Payer: Self-pay

## 2018-10-05 ENCOUNTER — Telehealth: Payer: Self-pay | Admitting: Hematology & Oncology

## 2018-10-05 VITALS — BP 123/72 | HR 93 | Temp 97.8°F | Resp 18 | Wt 187.0 lb

## 2018-10-05 DIAGNOSIS — Z79899 Other long term (current) drug therapy: Secondary | ICD-10-CM | POA: Diagnosis not present

## 2018-10-05 DIAGNOSIS — D700 Congenital agranulocytosis: Secondary | ICD-10-CM

## 2018-10-05 DIAGNOSIS — K7460 Unspecified cirrhosis of liver: Secondary | ICD-10-CM | POA: Insufficient documentation

## 2018-10-05 DIAGNOSIS — D72819 Decreased white blood cell count, unspecified: Secondary | ICD-10-CM | POA: Insufficient documentation

## 2018-10-05 DIAGNOSIS — D696 Thrombocytopenia, unspecified: Secondary | ICD-10-CM | POA: Insufficient documentation

## 2018-10-05 LAB — CBC WITH DIFFERENTIAL (CANCER CENTER ONLY)
Abs Immature Granulocytes: 0.01 10*3/uL (ref 0.00–0.07)
Basophils Absolute: 0.1 10*3/uL (ref 0.0–0.1)
Basophils Relative: 2 %
Eosinophils Absolute: 0.1 10*3/uL (ref 0.0–0.5)
Eosinophils Relative: 3 %
HCT: 40.4 % (ref 39.0–52.0)
Hemoglobin: 14.3 g/dL (ref 13.0–17.0)
Immature Granulocytes: 0 %
Lymphocytes Relative: 31 %
Lymphs Abs: 1.3 10*3/uL (ref 0.7–4.0)
MCH: 35.3 pg — ABNORMAL HIGH (ref 26.0–34.0)
MCHC: 35.4 g/dL (ref 30.0–36.0)
MCV: 99.8 fL (ref 80.0–100.0)
Monocytes Absolute: 0.5 10*3/uL (ref 0.1–1.0)
Monocytes Relative: 11 %
Neutro Abs: 2.2 10*3/uL (ref 1.7–7.7)
Neutrophils Relative %: 53 %
Platelet Count: 76 10*3/uL — ABNORMAL LOW (ref 150–400)
RBC: 4.05 MIL/uL — ABNORMAL LOW (ref 4.22–5.81)
RDW: 13.8 % (ref 11.5–15.5)
WBC Count: 4.1 10*3/uL (ref 4.0–10.5)
nRBC: 0 % (ref 0.0–0.2)

## 2018-10-05 LAB — PLATELET BY CITRATE

## 2018-10-05 LAB — CMP (CANCER CENTER ONLY)
ALT: 25 U/L (ref 0–44)
AST: 31 U/L (ref 15–41)
Albumin: 3.2 g/dL — ABNORMAL LOW (ref 3.5–5.0)
Alkaline Phosphatase: 71 U/L (ref 38–126)
Anion gap: 7 (ref 5–15)
BUN: 17 mg/dL (ref 8–23)
CO2: 24 mmol/L (ref 22–32)
Calcium: 9.5 mg/dL (ref 8.9–10.3)
Chloride: 106 mmol/L (ref 98–111)
Creatinine: 0.9 mg/dL (ref 0.61–1.24)
GFR, Est AFR Am: >60 mL/min (ref >60)
GFR, Estimated: >60 mL/min (ref >60)
Glucose, Bld: 99 mg/dL (ref 70–99)
Potassium: 4.6 mmol/L (ref 3.5–5.1)
Sodium: 137 mmol/L (ref 135–145)
Total Bilirubin: 2 mg/dL — ABNORMAL HIGH (ref 0.3–1.2)
Total Protein: 6.1 g/dL — ABNORMAL LOW (ref 6.5–8.1)

## 2018-10-05 LAB — SAVE SMEAR(SSMR), FOR PROVIDER SLIDE REVIEW

## 2018-10-05 NOTE — Progress Notes (Signed)
Hematology and Oncology Follow Up Visit  Steve Watson [id number] 23-Jul-1940 78 y.o. 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 is back for follow-up.  So far, everything is going pretty well for him.  He has had a good summer.  He has had no problems over the summertime.  He has had no issues with bleeding.  He has had no fever.  He has had no cough.  There is still some swelling with his legs.  He does have some element of cirrhosis.  There is been no change in bowel or bladder habits.  He has had no rashes.  His appetite has been pretty good.  He is trying to be very cautious with the coronavirus.    Overall, his performance status is ECOG 1.     Medications:  Current Outpatient Medications:  .  aspirin 81 MG tablet, Take 81 mg by mouth daily., Disp: , Rfl:  .  calcium & magnesium carbonates (MYLANTA) 311-232 MG per tablet, Take 1 tablet by mouth daily., Disp: , Rfl:  .  Cholecalciferol 2000 UNITS TABS, Take 1 tablet (2,000 Units total) by mouth daily at 12 noon., Disp: 30 each, Rfl: 2 .  furosemide (LASIX) 20 MG tablet, Take 1 tablet (20 mg total) by mouth daily., Disp: 10 tablet, Rfl: 0 .  Multiple Vitamins-Minerals (VISION VITAMINS) TABS, Take 2 tablets by mouth daily., Disp: , Rfl:  .  omeprazole (PRILOSEC) 10 MG capsule, Take 10 mg by mouth as needed. , Disp: , Rfl:  .  spironolactone (ALDACTONE) 50 MG tablet, Take 1 tablet (50 mg total) by mouth daily., Disp: 30 tablet, Rfl: 4  Allergies: No Known Allergies  Past Medical History, Surgical history, Social history, and Family History were reviewed and updated.  Review of Systems: Review of Systems  Constitutional: Negative.   HENT: Negative.   Eyes: Negative.   Respiratory: Negative.   Cardiovascular: Negative.   Gastrointestinal: Negative.   Genitourinary: Negative.    Musculoskeletal: Negative.   Skin: Negative.   Neurological: Negative.   Endo/Heme/Allergies: Negative.   Psychiatric/Behavioral: Negative.      Physical Exam:  vitals were not taken for this visit.   Wt Readings from Last 3 Encounters:  07/12/18 213 lb 12.8 oz (97 kg)  07/06/18 216 lb (98 kg)  01/04/18 206 lb (93.4 kg)     Physical Exam Vitals signs reviewed.  HENT:     Head: Normocephalic and atraumatic.  Eyes:     Pupils: Pupils are equal, round, and reactive to light.  Neck:     Musculoskeletal: Normal range of motion.  Cardiovascular:     Rate and Rhythm: Normal rate and regular rhythm.     Heart sounds: Normal heart sounds.  Pulmonary:     Effort: Pulmonary effort is normal.     Breath sounds: Normal breath sounds.  Abdominal:     General: Bowel sounds are normal.     Palpations: Abdomen is soft.  Musculoskeletal: Normal range of motion.        General: No tenderness or deformity.  Lymphadenopathy:     Cervical: No cervical adenopathy.  Skin:    General: Skin is warm and dry.     Findings: No erythema or rash.  Neurological:     Mental Status: He is alert and oriented to person, place, and time.  Psychiatric:  Behavior: Behavior normal.        Thought Content: Thought content normal.        Judgment: Judgment normal.      Lab Results  Component Value Date   WBC 2.7 (L) 07/06/2018   HGB 12.8 (L) 07/06/2018   HCT 37.6 (L) 07/06/2018   MCV 98.2 07/06/2018   PLT 56 (L) 07/06/2018     Chemistry      Component Value Date/Time   NA 135 07/26/2018 0854   K 4.2 07/26/2018 0854   CL 104 07/26/2018 0854   CO2 23 07/26/2018 0854   BUN 11 07/26/2018 0854   CREATININE 0.86 07/26/2018 0854   CREATININE 0.74 07/06/2018 0812      Component Value Date/Time   CALCIUM 8.6 07/26/2018 0854   ALKPHOS 87 07/06/2018 0812   AST 28 07/06/2018 0812   ALT 15 07/06/2018 0812   BILITOT 2.2 (H) 07/06/2018 0812       Impression and Plan: [name] is a  78 year old white male.  We have been following him now for several years.  So far, there really has not been any obvious change with his blood counts.  His white cell count and platelet count are actually a little bit better today.  I think we can probably get him back in 6 months now.  We can try to get him through the holidays.  If he has any problems between now and when we see him back, we will be more than happy to see him.  Volanda Napoleon, MD 9/11/20208:03 AM

## 2018-10-05 NOTE — Telephone Encounter (Signed)
lmom to inform pt of March appts per 9/11 LOS

## 2019-02-20 ENCOUNTER — Ambulatory Visit (INDEPENDENT_AMBULATORY_CARE_PROVIDER_SITE_OTHER): Payer: Medicare Other | Admitting: Physician Assistant

## 2019-02-20 ENCOUNTER — Ambulatory Visit (INDEPENDENT_AMBULATORY_CARE_PROVIDER_SITE_OTHER): Payer: Medicare Other

## 2019-02-20 ENCOUNTER — Encounter: Payer: Self-pay | Admitting: Physician Assistant

## 2019-02-20 ENCOUNTER — Other Ambulatory Visit: Payer: Self-pay

## 2019-02-20 VITALS — BP 130/78 | HR 100 | Temp 98.9°F | Ht 68.0 in | Wt 185.0 lb

## 2019-02-20 VITALS — BP 146/82 | HR 100 | Temp 98.9°F | Ht 68.0 in | Wt 185.0 lb

## 2019-02-20 DIAGNOSIS — Z23 Encounter for immunization: Secondary | ICD-10-CM

## 2019-02-20 DIAGNOSIS — E663 Overweight: Secondary | ICD-10-CM

## 2019-02-20 DIAGNOSIS — Z1211 Encounter for screening for malignant neoplasm of colon: Secondary | ICD-10-CM | POA: Diagnosis not present

## 2019-02-20 DIAGNOSIS — Z0000 Encounter for general adult medical examination without abnormal findings: Secondary | ICD-10-CM

## 2019-02-20 DIAGNOSIS — R609 Edema, unspecified: Secondary | ICD-10-CM | POA: Diagnosis not present

## 2019-02-20 LAB — COMPREHENSIVE METABOLIC PANEL
ALT: 19 U/L (ref 0–53)
AST: 29 U/L (ref 0–37)
Albumin: 3.3 g/dL — ABNORMAL LOW (ref 3.5–5.2)
Alkaline Phosphatase: 77 U/L (ref 39–117)
BUN: 13 mg/dL (ref 6–23)
CO2: 24 mEq/L (ref 19–32)
Calcium: 9 mg/dL (ref 8.4–10.5)
Chloride: 109 mEq/L (ref 96–112)
Creatinine, Ser: 0.64 mg/dL (ref 0.40–1.50)
GFR: 121.3 mL/min (ref >60.00)
Glucose, Bld: 93 mg/dL (ref 70–99)
Potassium: 4 mEq/L (ref 3.5–5.1)
Sodium: 139 mEq/L (ref 135–145)
Total Bilirubin: 1.9 mg/dL — ABNORMAL HIGH (ref 0.2–1.2)
Total Protein: 6 g/dL (ref 6.0–8.3)

## 2019-02-20 LAB — LIPID PANEL
Cholesterol: 119 mg/dL (ref 0–200)
HDL: 38.2 mg/dL — ABNORMAL LOW (ref >39.00)
LDL Cholesterol: 47 mg/dL (ref 0–99)
NonHDL: 80.83
Total CHOL/HDL Ratio: 3
Triglycerides: 169 mg/dL — ABNORMAL HIGH (ref 0.0–149.0)
VLDL: 33.8 mg/dL (ref 0.0–40.0)

## 2019-02-20 MED ORDER — FUROSEMIDE 20 MG PO TABS: 20.0000 mg | ORAL_TABLET | Freq: Every day | ORAL | 0 refills | Status: DC

## 2019-02-20 NOTE — Progress Notes (Signed)
Patient presents to clinic today for follow-up of peripheral edema. Patient just had [place] with RN. No other concerns identified during this visit. . BP initially elevated but at goal for age on recheck.   Patient previously on Spironolactone and Furosemide with good result. Has not been on either medication in many months. Notes legs have been doing "fine". Denies any noted swelling. Denies chest pain, SOB, PND or orthopnea. Endorses trying to watch diet. Keeps active.    Past Medical History:  Diagnosis Date  . Chicken pox   . GERD (gastroesophageal reflux disease)   . 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 aspirin 81 MG tablet Take 81 mg by mouth daily.    . calcium & magnesium carbonates (MYLANTA) 311-232 MG per tablet Take 1 tablet by mouth daily.    . Cholecalciferol 2000 UNITS TABS Take 1 tablet (2,000 Units total) by mouth daily at 12 noon. 30 each 2  . Multiple Vitamins-Minerals (VISION VITAMINS) TABS Take 2 tablets by mouth daily.    Marland Kitchen omeprazole (PRILOSEC) 10 MG capsule Take 10 mg by mouth as needed.     . Zinc 100 MG TABS Take by mouth.    . spironolactone (ALDACTONE) 50 MG tablet Take 1 tablet (50 mg total) by mouth daily. (Patient not taking: Reported on 02/20/2019) 30 tablet 4   No current facility-administered medications on file prior to visit.    No Known Allergies  Family History  Problem Relation Age of Onset  . Hypertension Mother   . Hypertension Father   . Hypertension Brother   . Diabetes Brother   . Healthy Child   . Healthy Grandchild     Social History   Socioeconomic History  . Marital status: Married    Spouse name: Not on file  . Number of children: Not on file  . Years of education: Not on file  . Highest education level: Not on file  Occupational History  . Not on file  Tobacco Use  . Smoking status: Never Smoker  .  Smokeless tobacco: Never Used  . Tobacco comment: NEVER USED TOBACCO  Substance and Sexual Activity  . Alcohol use: No    Alcohol/week: 0.0 standard drinks  . Drug use: No  . Sexual activity: Yes  Other Topics Concern  . Not on file  Social History Narrative   2 Sons; grandson works in Recruitment consultant in [employer] for 8 years    Enjoys weightlifting    Social Determinants of Radio broadcast assistant Strain:   . Difficulty of Paying Living Expenses: Not on file  Food Insecurity:   . Worried About Charity fundraiser in the Last Year: Not on file  . Ran Out of Food in the Last Year: Not on file  Transportation Needs:   . Lack of Transportation (Medical): Not on file  . Lack of Transportation (Non-Medical): Not on file  Physical Activity:   . Days of Exercise per Week: Not on file  . Minutes of Exercise per Session: Not on file  Stress:   . Feeling of Stress : Not on file  Social Connections:   . Frequency of Communication with Friends and Family: Not on file  . Frequency of Social Gatherings with Friends and Family: Not on file  . Attends Religious Services: Not on file  . Active Member of Clubs or Organizations:  Not on file  . Attends Archivist Meetings: Not on file  . Marital Status: Not on file   Review of Systems - See HPI.  All other ROS are negative.  BP 130/78   Pulse 100   Temp 98.9 F (37.2 C) (Temporal)   Ht 5' 8"  (1.727 m)   Wt 185 lb (83.9 kg)   SpO2 97%   BMI 28.13 kg/m   Physical Exam Vitals reviewed.  Constitutional:      Appearance: Normal appearance.  HENT:     Head: Normocephalic and atraumatic.  Cardiovascular:     Rate and Rhythm: Normal rate and regular rhythm.     Pulses: Normal pulses.     Heart sounds: Normal heart sounds.     Comments: 1+ edema of bilateral lower extremities noted. No evidence of venous stasis dermatitis on examination. Pulses equal bilaterally.  Pulmonary:     Effort: Pulmonary effort is normal.    Musculoskeletal:     Cervical back: Neck supple.  Neurological:     Mental Status: He is alert.  Psychiatric:        Mood and Affect: Mood normal.    Assessment/Plan: 1. Peripheral edema Chronic. 1+ edema bilaterally today. Lungs CTAB. BP stable. Restart Furosemide 20 mg -- daily x 3 days, then as needed for recurring swelling. Recommend DASH diet, elevation and compression stockings. Will check CMP today.  - Comprehensive metabolic panel  2. Overweight (BMI 25.0-29.9) Dietary and exercise recommendations reviewed. Check fasting lipids today.  - Lipid panel  This visit occurred during the SARS-CoV-2 public health emergency.  Safety protocols were in place, including screening questions prior to the visit, additional usage of staff PPE, and extensive cleaning of exam room while observing appropriate contact time as indicated for disinfecting solutions.     Leeanne Rio, PA-C

## 2019-02-20 NOTE — Progress Notes (Signed)
Subjective:   Steve Watson is a 79 y.o. male who presents for Medicare Annual/Subsequent preventive examination.  Review of Systems:   Cardiac Risk Factors include: advanced age (>44mn, >>46women)    Objective:    Vitals: BP (!) 146/82   Pulse 100   Ht 5' 8"  (1.727 m)   Wt 185 lb (83.9 kg)   SpO2 97%   BMI 28.13 kg/m   Body mass index is 28.13 kg/m.  Advanced Directives 02/20/2019 07/06/2018 01/04/2018 08/10/2017 08/08/2017 05/11/2017 11/10/2016  Does Patient Have a Medical Advance Directive? No Yes No No No No No  Type of Advance Directive - Living will;Healthcare Power of Attorney - - - - -  [place]? - No - copy requested - - - - -  Would patient like information on creating a medical advance directive? Yes (MAU/Ambulatory/Procedural Areas - Information given) - No - Patient declined - Yes (Inpatient - patient defers creating a medical advance directive at this time) - -    Tobacco Social History   Tobacco Use  Smoking Status Never Smoker  Smokeless Tobacco Never Used  Tobacco Comment   NEVER USED TOBACCO     Counseling given: Not Answered Comment: NEVER USED TOBACCO   Clinical Intake:  Pre-visit preparation completed: Yes  Pain : No/denies pain  Diabetes: No  How often do you need to have someone help you when you read instructions, pamphlets, or other written materials from your doctor or pharmacy?: 1 - Never  Interpreter Needed?: No  Information entered by :: CDenman GeorgeLPN  Past Medical History:  Diagnosis Date  . Chicken pox   . GERD (gastroesophageal reflux disease)   . 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 NO PAST SURGERIES     Family History  Problem Relation Age of Onset  . Hypertension Mother   . Hypertension Father   . Hypertension Brother   . Diabetes Brother   . Healthy Child   . Healthy Grandchild    Social  History   Socioeconomic History  . Marital status: Married    Spouse name: Not on file  . Number of children: Not on file  . Years of education: Not on file  . Highest education level: Not on file  Occupational History  . Not on file  Tobacco Use  . Smoking status: Never Smoker  . Smokeless tobacco: Never Used  . Tobacco comment: NEVER USED TOBACCO  Substance and Sexual Activity  . Alcohol use: No    Alcohol/week: 0.0 standard drinks  . Drug use: No  . Sexual activity: Yes  Other Topics Concern  . Not on file  Social History Narrative   2 Sons; grandson works in hRecruitment consultantin [employer] 8 years    Enjoys weightlifting    Social Determinants of HRadio broadcast assistantStrain:   . Difficulty of Paying Living Expenses: Not on file  Food Insecurity:   . Worried About RCharity fundraiserin the Last Year: Not on file  . Ran Out of Food in the Last Year: Not on file  Transportation Needs:   . Lack of Transportation (Medical): Not on file  . Lack of Transportation (Non-Medical): Not on file  Physical Activity:   . Days of Exercise per Week: Not on file  . Minutes of Exercise per Session: Not on file  Stress:   .  Feeling of Stress : Not on file  Social Connections:   . Frequency of Communication with Friends and Family: Not on file  . Frequency of Social Gatherings with Friends and Family: Not on file  . Attends Religious Services: Not on file  . Active Member of Clubs or Organizations: Not on file  . Attends Archivist Meetings: Not on file  . Marital Status: Not on file    Outpatient Encounter Medications as of 02/20/2019  Medication Sig  . aspirin 81 MG tablet Take 81 mg by mouth daily.  . calcium & magnesium carbonates (MYLANTA) 311-232 MG per tablet Take 1 tablet by mouth daily.  . Cholecalciferol 2000 UNITS TABS Take 1 tablet (2,000 Units total) by mouth daily at 12 noon.  . Multiple Vitamins-Minerals (VISION VITAMINS) TABS Take 2 tablets  by mouth daily.  Marland Kitchen omeprazole (PRILOSEC) 10 MG capsule Take 10 mg by mouth as needed.   . Zinc 100 MG TABS Take by mouth.  . furosemide (LASIX) 20 MG tablet Take 1 tablet (20 mg total) by mouth daily. (Patient not taking: Reported on 02/20/2019)  . spironolactone (ALDACTONE) 50 MG tablet Take 1 tablet (50 mg total) by mouth daily. (Patient not taking: Reported on 02/20/2019)   No facility-administered encounter medications on file as of 02/20/2019.    Activities of Daily Living In your present state of health, do you have any difficulty performing the following activities: 02/20/2019 07/12/2018  Hearing? N N  Vision? N N  Difficulty concentrating or making decisions? N N  Walking or climbing stairs? N N  Dressing or bathing? N N  Doing errands, shopping? N N  Preparing Food and eating ? N -  Using the Toilet? N -  In the past six months, have you accidently leaked urine? N -  Do you have problems with loss of bowel control? N -  Managing your Medications? N -  Managing your Finances? N -  Housekeeping or managing your Housekeeping? N -  Some recent data might be hidden    Patient Care Team: Delorse Limber as PCP - General (Physician Assistant) Volanda Napoleon, MD as Consulting Physician (Oncology)   Assessment:   This is a routine wellness examination for [facility].  Exercise Activities and Dietary recommendations Current Exercise Habits: Home exercise routine, Type of exercise: strength training/weights, Time (Minutes): 45, Frequency (Times/Week): 4, Weekly Exercise (Minutes/Week): 180, Intensity: Moderate  Goals   None     Fall Risk Fall Risk  02/20/2019 08/08/2017 10/08/2015 08/17/2015 02/06/2015  Falls in the past year? 0 No No No No  Number falls in past yr: 0 - - - -  Injury with Fall? 0 - - - -  Follow up Falls evaluation completed;Education provided;Falls prevention discussed - - - -   Is the patient's home free of loose throw rugs in walkways, pet beds,  electrical cords, etc?   yes      Grab bars in the bathroom? yes      Handrails on the stairs?   yes      Adequate lighting?   yes  Timed Get Up and Go Performed: completed and within normal timeframe; no gait abnormalities noted   Depression Screen PHQ 2/9 Scores 02/20/2019 07/12/2018 08/08/2017 02/06/2015  PHQ - 2 Score 0 0 0 0  PHQ- 9 Score - 0 - -    Cognitive Function MMSE - Mini Mental State Exam 08/08/2017  Orientation to time 5  Orientation to Place 4  Registration 3  Attention/ Calculation 4  Recall 1  Language- name 2 objects 2  Language- repeat 1  Language- follow 3 step command 3  Language- read & follow direction 1  Write a sentence 1  Copy design 1  Total score 26     6CIT Screen 02/20/2019  What Year? 0 points  What month? 0 points  What time? 0 points  Count back from 20 0 points  Months in reverse 0 points  Repeat phrase 0 points  Total Score 0    Immunization History  Administered Date(s) Administered  . Pneumococcal Conjugate-13 02/06/2015, 08/08/2017  . Pneumococcal Polysaccharide-23 02/20/2019    Qualifies for Shingles Vaccine? Discussed and patient will check with pharmacy for coverage.  Patient education handout provided   Screening Tests Health Maintenance  Topic Date Due  . DTAP VACCINES (1) 05/28/1940  . DTaP/Tdap/Td (1 - Tdap) 03/27/1961  . INFLUENZA VACCINE  04/24/2019 (Originally 08/25/2018)  . TETANUS/TDAP  02/20/2020 (Originally [birth year]27/1961)  . PNA vac Low Risk Adult  Completed   Cancer Screenings: Lung: Low Dose CT Chest recommended if Age 38-80 years, 30 pack-year currently smoking OR have quit w/in 15years. Patient does not qualify. Colorectal: Cologuard ordered today    Plan:  I have personally reviewed and addressed the Medicare Annual Wellness questionnaire and have noted the following in the patient's chart:  A. Medical and social history B. Use of alcohol, tobacco or illicit drugs  C. Current medications and  supplements D. Functional ability and status E.  Nutritional status F.  Physical activity G. Advance directives H. List of other physicians I.  Hospitalizations, surgeries, and ER visits in previous 12 months J.  [place] such as hearing and vision if needed, cognitive and depression L. Referrals, records requested, and appointments- Cologuard ordered today   In addition, I have reviewed and discussed with patient certain preventive protocols, quality metrics, and best practice recommendations. A written personalized care plan for preventive services as well as general preventive health recommendations were provided to patient.   Signed,  Denman George, LPN  Nurse Health Advisor   Nurse Notes: no additional

## 2019-02-20 NOTE — Patient Instructions (Addendum)
Steve Watson , Thank you for taking time to come for your Medicare Wellness Visit. I appreciate your ongoing commitment to your health goals. Please review the following plan we discussed and let me know if I can assist you in the future.   Screening recommendations/referrals: Colorectal Screening: Cologuard ordered today   Vision and Dental Exams: Recommended annual ophthalmology exams for early detection of glaucoma and other disorders of the eye Recommended annual dental exams for proper oral hygiene  Vaccinations: Influenza vaccine: recommended   Pneumococcal vaccine: Prevnar 08/08/17; Pneumovax recommended  Tdap vaccine: recommended;  Please call your insurance company to determine your out of pocket expense. You may  receive this vaccine at your local pharmacy or Health Dept. Shingles vaccine: Please call your insurance company to determine your out of pocket expense for the Shingrix vaccine. You may receive this vaccine at your local pharmacy. (see handouts provided)   Advanced directives: Please bring a copy of your POA (Power of Attorney) and/or Living Will to your next appointment.  Goals: Recommend to drink at least 6-8 8oz glasses of water per day and consume a balanced diet rich in fresh fruits and vegetables.    Next appointment: Please schedule your Annual Wellness Visit with your Nurse Health Advisor in one year.  Preventive Care 77 Years and Older, Male Preventive care refers to lifestyle choices and visits with your health care provider that can promote health and wellness. What does preventive care include?  A yearly physical exam. This is also called an annual well check.  Dental exams once or twice a year.  Routine eye exams. Ask your health care provider how often you should have your eyes checked.  Personal lifestyle choices, including:  Daily care of your teeth and gums.  Regular physical activity.  Eating a healthy diet.  Avoiding tobacco and drug  use.  Limiting alcohol use.  Practicing safe sex.  Taking low doses of aspirin every day if recommended by your health care provider..  Taking vitamin and mineral supplements as recommended by your health care provider. What happens during an annual well check? The services and screenings done by your health care provider during your annual well check will depend on your age, overall health, lifestyle risk factors, and family history of disease. Counseling  Your health care provider may ask you questions about your:  Alcohol use.  Tobacco use.  Drug use.  Emotional well-being.  Home and relationship well-being.  Sexual activity.  Eating habits.  History of falls.  Memory and ability to understand (cognition).  Work and work Statistician. Screening  You may have the following tests or measurements:  Height, weight, and BMI.  Blood pressure.  Lipid and cholesterol levels. These may be checked every 5 years, or more frequently if you are over 20 years old.  Skin check.  Lung cancer screening. You may have this screening every year starting at age 61 if you have a 30-pack-year history of smoking and currently smoke or have quit within the past 15 years.  Fecal occult blood test (FOBT) of the stool. You may have this test every year starting at age 67.  Flexible sigmoidoscopy or colonoscopy. You may have a sigmoidoscopy every 5 years or a colonoscopy every 10 years starting at age 53.  Prostate cancer screening. Recommendations will vary depending on your family history and other risks.  Hepatitis C blood test.  Hepatitis B blood test.  Sexually transmitted disease (STD) testing.  Diabetes screening. This is done by  checking your blood sugar (glucose) after you have not eaten for a while (fasting). You may have this done every 1-3 years.  Abdominal aortic aneurysm (AAA) screening. You may need this if you are a current or former smoker.  Osteoporosis. You may  be screened starting at age 20 if you are at high risk. Talk with your health care provider about your test results, treatment options, and if necessary, the need for more tests. Vaccines  Your health care provider may recommend certain vaccines, such as:  Influenza vaccine. This is recommended every year.  Tetanus, diphtheria, and acellular pertussis (Tdap, Td) vaccine. You may need a Td booster every 10 years.  Zoster vaccine. You may need this after age 38.  Pneumococcal 13-valent conjugate (PCV13) vaccine. One dose is recommended after age 66.  Pneumococcal polysaccharide (PPSV23) vaccine. One dose is recommended after age 30. Talk to your health care provider about which screenings and vaccines you need and how often you need them. This information is not intended to replace advice given to you by your health care provider. Make sure you discuss any questions you have with your health care provider. Document Released: 02/06/2015 Document Revised: 09/30/2015 Document Reviewed: 11/11/2014 Elsevier Interactive Patient Education  2017 [place] Prevention in the Home Falls can cause injuries. They can happen to people of all ages. There are many things you can do to make your home safe and to help prevent falls. What can I do on the outside of my home?  Regularly fix the edges of walkways and driveways and fix any cracks.  Remove anything that might make you trip as you walk through a door, such as a raised step or threshold.  Trim any bushes or trees on the path to your home.  Use bright outdoor lighting.  Clear any walking paths of anything that might make someone trip, such as rocks or tools.  Regularly check to see if handrails are loose or broken. Make sure that both sides of any steps have handrails.  Any raised decks and porches should have guardrails on the edges.  Have any leaves, snow, or ice cleared regularly.  Use sand or salt on walking paths during  winter.  Clean up any spills in your garage right away. This includes oil or grease spills. What can I do in the bathroom?  Use night lights.  Install grab bars by the toilet and in the tub and shower. Do not use towel bars as grab bars.  Use non-skid mats or decals in the tub or shower.  If you need to sit down in the shower, use a plastic, non-slip stool.  Keep the floor dry. Clean up any water that spills on the floor as soon as it happens.  Remove soap buildup in the tub or shower regularly.  Attach bath mats securely with double-sided non-slip rug tape.  Do not have throw rugs and other things on the floor that can make you trip. What can I do in the bedroom?  Use night lights.  Make sure that you have a light by your bed that is easy to reach.  Do not use any sheets or blankets that are too big for your bed. They should not hang down onto the floor.  Have a firm chair that has side arms. You can use this for support while you get dressed.  Do not have throw rugs and other things on the floor that can make you trip. What can I  do in the kitchen?  Clean up any spills right away.  Avoid walking on wet floors.  Keep items that you use a lot in easy-to-reach places.  If you need to reach something above you, use a strong step stool that has a grab bar.  Keep electrical cords out of the way.  Do not use floor polish or wax that makes floors slippery. If you must use wax, use non-skid floor wax.  Do not have throw rugs and other things on the floor that can make you trip. What can I do with my stairs?  Do not leave any items on the stairs.  Make sure that there are handrails on both sides of the stairs and use them. Fix handrails that are broken or loose. Make sure that handrails are as long as the stairways.  Check any carpeting to make sure that it is firmly attached to the stairs. Fix any carpet that is loose or worn.  Avoid having throw rugs at the top or  bottom of the stairs. If you do have throw rugs, attach them to the floor with carpet tape.  Make sure that you have a light switch at the top of the stairs and the bottom of the stairs. If you do not have them, ask someone to add them for you. What else can I do to help prevent falls?  Wear shoes that:  Do not have high heels.  Have rubber bottoms.  Are comfortable and fit you well.  Are closed at the toe. Do not wear sandals.  If you use a stepladder:  Make sure that it is fully opened. Do not climb a closed stepladder.  Make sure that both sides of the stepladder are locked into place.  Ask someone to hold it for you, if possible.  Clearly mark and make sure that you can see:  Any grab bars or handrails.  First and last steps.  Where the edge of each step is.  Use tools that help you move around (mobility aids) if they are needed. These include:  Canes.  Walkers.  Scooters.  Crutches.  Turn on the lights when you go into a dark area. Replace any light bulbs as soon as they burn out.  Set up your furniture so you have a clear path. Avoid moving your furniture around.  If any of your floors are uneven, fix them.  If there are any pets around you, be aware of where they are.  Review your medicines with your doctor. Some medicines can make you feel dizzy. This can increase your chance of falling. Ask your doctor what other things that you can do to help prevent falls. This information is not intended to replace advice given to you by your health care provider. Make sure you discuss any questions you have with your health care provider. Document Released: 11/06/2008 Document Revised: 06/18/2015 Document Reviewed: 02/14/2014 Elsevier Interactive Patient Education  2017 [employer].

## 2019-02-20 NOTE — Patient Instructions (Signed)
Please go to the lab today for blood work.  I will call you with your results. We will alter treatment regimen(s) if indicated by your results.   Please restart the Furosemide once daily for the next 3 days to help with leg swelling. Then use once daily as needed. Elevate legs while resting. Please consider getting compression stockings and wearing daily.  Let's follow-up in 2-3 weeks via phone visit to see how things are going.

## 2019-04-05 ENCOUNTER — Inpatient Hospital Stay: Payer: Medicare Other | Attending: Hematology & Oncology | Admitting: Hematology & Oncology

## 2019-04-05 ENCOUNTER — Inpatient Hospital Stay: Payer: Medicare Other

## 2019-04-05 ENCOUNTER — Encounter: Payer: Self-pay | Admitting: Hematology & Oncology

## 2019-04-05 ENCOUNTER — Other Ambulatory Visit: Payer: Self-pay

## 2019-04-05 VITALS — BP 141/76 | HR 83 | Temp 96.9°F | Resp 18 | Wt 187.0 lb

## 2019-04-05 DIAGNOSIS — K7460 Unspecified cirrhosis of liver: Secondary | ICD-10-CM | POA: Diagnosis not present

## 2019-04-05 DIAGNOSIS — D7381 Neutropenic splenomegaly: Secondary | ICD-10-CM | POA: Diagnosis not present

## 2019-04-05 DIAGNOSIS — Z79899 Other long term (current) drug therapy: Secondary | ICD-10-CM | POA: Insufficient documentation

## 2019-04-05 DIAGNOSIS — D72819 Decreased white blood cell count, unspecified: Secondary | ICD-10-CM | POA: Diagnosis not present

## 2019-04-05 DIAGNOSIS — D696 Thrombocytopenia, unspecified: Secondary | ICD-10-CM | POA: Diagnosis not present

## 2019-04-05 LAB — CMP (CANCER CENTER ONLY)
ALT: 19 U/L (ref 0–44)
AST: 28 U/L (ref 15–41)
Albumin: 3.4 g/dL — ABNORMAL LOW (ref 3.5–5.0)
Alkaline Phosphatase: 70 U/L (ref 38–126)
Anion gap: 7 (ref 5–15)
BUN: 12 mg/dL (ref 8–23)
CO2: 24 mmol/L (ref 22–32)
Calcium: 9.1 mg/dL (ref 8.9–10.3)
Chloride: 109 mmol/L (ref 98–111)
Creatinine: 0.76 mg/dL (ref 0.61–1.24)
GFR, Est AFR Am: >60 mL/min (ref >60)
GFR, Estimated: >60 mL/min (ref >60)
Glucose, Bld: 97 mg/dL (ref 70–99)
Potassium: 4 mmol/L (ref 3.5–5.1)
Sodium: 140 mmol/L (ref 135–145)
Total Bilirubin: 1.8 mg/dL — ABNORMAL HIGH (ref 0.3–1.2)
Total Protein: 6.3 g/dL — ABNORMAL LOW (ref 6.5–8.1)

## 2019-04-05 LAB — CBC WITH DIFFERENTIAL (CANCER CENTER ONLY)
Abs Immature Granulocytes: 0 10*3/uL (ref 0.00–0.07)
Basophils Absolute: 0 10*3/uL (ref 0.0–0.1)
Basophils Relative: 1 %
Eosinophils Absolute: 0.1 10*3/uL (ref 0.0–0.5)
Eosinophils Relative: 3 %
HCT: 40.9 % (ref 39.0–52.0)
Hemoglobin: 13.9 g/dL (ref 13.0–17.0)
Immature Granulocytes: 0 %
Lymphocytes Relative: 42 %
Lymphs Abs: 1 10*3/uL (ref 0.7–4.0)
MCH: 33.7 pg (ref 26.0–34.0)
MCHC: 34 g/dL (ref 30.0–36.0)
MCV: 99 fL (ref 80.0–100.0)
Monocytes Absolute: 0.3 10*3/uL (ref 0.1–1.0)
Monocytes Relative: 12 %
Neutro Abs: 1 10*3/uL — ABNORMAL LOW (ref 1.7–7.7)
Neutrophils Relative %: 42 %
Platelet Count: 53 10*3/uL — ABNORMAL LOW (ref 150–400)
RBC: 4.13 MIL/uL — ABNORMAL LOW (ref 4.22–5.81)
RDW: 14.2 % (ref 11.5–15.5)
WBC Count: 2.3 10*3/uL — ABNORMAL LOW (ref 4.0–10.5)
nRBC: 0 % (ref 0.0–0.2)

## 2019-04-05 LAB — PLATELET BY CITRATE

## 2019-04-05 LAB — SAVE SMEAR(SSMR), FOR PROVIDER SLIDE REVIEW

## 2019-04-05 NOTE — Progress Notes (Signed)
Hematology and Oncology Follow Up Visit  Steve Watson [id number] 1940/08/08 80 y.o. 04/05/2019   Principle Diagnosis:   Leukopenia and Thrombocytopenia - cirrhosis by ultrasound imaging  Current Therapy:    Observation     Interim History:  Steve Watson is back for follow-up.  So far, everything is going pretty well for him.  So far, he has had no problems with the coronavirus.  Does not sound like he will get a vaccine however.  He has had no problems with bleeding or bruising.  He has had no cough or shortness of breath.  There is no change in bowel or bladder habits.  He has had no rashes.  He has had no leg swelling.  As far as he knows, there is no issues with respect to his cirrhosis.  He has had no headache.  His appetite has been quite good.  He has had no nausea or vomiting.  Overall, his performance status is ECOG 1.     Medications:  Current Outpatient Medications:  .  aspirin 81 MG tablet, Take 81 mg by mouth daily., Disp: , Rfl:  .  calcium & magnesium carbonates (MYLANTA) 311-232 MG per tablet, Take 1 tablet by mouth daily., Disp: , Rfl:  .  Cholecalciferol 2000 UNITS TABS, Take 1 tablet (2,000 Units total) by mouth daily at 12 noon., Disp: 30 each, Rfl: 2 .  furosemide (LASIX) 20 MG tablet, Take 1 tablet (20 mg total) by mouth daily., Disp: 30 tablet, Rfl: 0 .  Multiple Vitamins-Minerals (VISION VITAMINS) TABS, Take 2 tablets by mouth daily., Disp: , Rfl:  .  omeprazole (PRILOSEC) 10 MG capsule, Take 10 mg by mouth as needed. , Disp: , Rfl:  .  spironolactone (ALDACTONE) 50 MG tablet, Take 1 tablet (50 mg total) by mouth daily. (Patient not taking: Reported on 02/20/2019), Disp: 30 tablet, Rfl: 4 .  Zinc 100 MG TABS, Take by mouth., Disp: , Rfl:   Allergies: No Known Allergies  Past Medical History, Surgical history, Social history, and Family History were reviewed and updated.  Review of Systems: Review of Systems  Constitutional: Negative.   HENT:  Negative.   Eyes: Negative.   Respiratory: Negative.   Cardiovascular: Negative.   Gastrointestinal: Negative.   Genitourinary: Negative.   Musculoskeletal: Negative.   Skin: Negative.   Neurological: Negative.   Endo/Heme/Allergies: Negative.   Psychiatric/Behavioral: Negative.      Physical Exam:  weight is 187 lb (84.8 kg). His temporal temperature is 96.9 F (36.1 C) (abnormal). His blood pressure is 141/76 (abnormal) and his pulse is 83. His respiration is 18 and oxygen saturation is 100%.   Wt Readings from Last 3 Encounters:  04/05/19 187 lb (84.8 kg)  02/20/19 185 lb (83.9 kg)  02/20/19 185 lb (83.9 kg)     Physical Exam Vitals reviewed.  HENT:     Head: Normocephalic and atraumatic.  Eyes:     Pupils: Pupils are equal, round, and reactive to light.  Cardiovascular:     Rate and Rhythm: Normal rate and regular rhythm.     Heart sounds: Normal heart sounds.  Pulmonary:     Effort: Pulmonary effort is normal.     Breath sounds: Normal breath sounds.  Abdominal:     General: Bowel sounds are normal.     Palpations: Abdomen is soft.  Musculoskeletal:        General: No tenderness or deformity. Normal range of motion.     Cervical back: Normal range of  motion.  Lymphadenopathy:     Cervical: No cervical adenopathy.  Skin:    General: Skin is warm and dry.     Findings: No erythema or rash.  Neurological:     Mental Status: He is alert and oriented to person, place, and time.  Psychiatric:        Behavior: Behavior normal.        Thought Content: Thought content normal.        Judgment: Judgment normal.      Lab Results  Component Value Date   WBC 2.3 (L) 04/05/2019   HGB 13.9 04/05/2019   HCT 40.9 04/05/2019   MCV 99.0 04/05/2019   PLT 53 (L) 04/05/2019     Chemistry      Component Value Date/Time   NA 139 02/20/2019 0927   K 4.0 02/20/2019 0927   CL 109 02/20/2019 0927   CO2 24 02/20/2019 0927   BUN 13 02/20/2019 0927   CREATININE 0.64  02/20/2019 0927   CREATININE 0.90 10/05/2018 0749      Component Value Date/Time   CALCIUM 9.0 02/20/2019 0927   ALKPHOS 77 02/20/2019 0927   AST 29 02/20/2019 0927   AST 31 10/05/2018 0749   ALT 19 02/20/2019 0927   ALT 25 10/05/2018 0749   BILITOT 1.9 (H) 02/20/2019 0927   BILITOT 2.0 (H) 10/05/2018 0749       Impression and Plan: Steve Watson is a 79 year old white male.  We have been following him now for several years.  So far, there really has not been any obvious change with his blood counts.  I realize that his platelet count is little bit lower today.  This does tend to fluctuate.  I looked at his blood smear under the microscope.  I do not see anything that looked unusual.  I do not see anything that looked like any kind of progression.  I suppose he may have an element of myelodysplasia.  However, I just do not feel compelled to put him through a bone marrow biopsy right now.  I do think it would be a good idea to get another ultrasound of his abdomen.  I will set this up for him.  We will plan to get him back in 3 months.    Volanda Napoleon, MD 3/12/20218:15 AM

## 2019-04-09 ENCOUNTER — Ambulatory Visit (HOSPITAL_BASED_OUTPATIENT_CLINIC_OR_DEPARTMENT_OTHER)
Admission: RE | Admit: 2019-04-09 | Discharge: 2019-04-09 | Disposition: A | Discharge status: Home / Self Care | Payer: Medicare Other | Source: Ambulatory Visit | Attending: Hematology & Oncology | Admitting: Hematology & Oncology

## 2019-04-09 ENCOUNTER — Other Ambulatory Visit: Payer: Self-pay

## 2019-04-09 DIAGNOSIS — D696 Thrombocytopenia, unspecified: Secondary | ICD-10-CM

## 2019-04-09 DIAGNOSIS — N2889 Other specified disorders of kidney and ureter: Secondary | ICD-10-CM | POA: Diagnosis not present

## 2019-04-09 DIAGNOSIS — R161 Splenomegaly, not elsewhere classified: Secondary | ICD-10-CM | POA: Diagnosis not present

## 2019-04-09 DIAGNOSIS — D7381 Neutropenic splenomegaly: Secondary | ICD-10-CM | POA: Diagnosis not present

## 2019-04-11 ENCOUNTER — Telehealth: Payer: Self-pay | Admitting: Hematology & Oncology

## 2019-04-11 NOTE — Telephone Encounter (Signed)
Appointments scheduled calendar mailed per 3/12 los

## 2019-05-01 ENCOUNTER — Other Ambulatory Visit: Payer: Self-pay | Admitting: Hematology & Oncology

## 2019-05-01 DIAGNOSIS — N2889 Other specified disorders of kidney and ureter: Secondary | ICD-10-CM

## 2019-05-02 ENCOUNTER — Other Ambulatory Visit: Payer: Self-pay | Admitting: *Deleted

## 2019-05-02 ENCOUNTER — Telehealth: Payer: Self-pay | Admitting: *Deleted

## 2019-05-02 NOTE — Telephone Encounter (Signed)
Patient notified per order of Dr. Marin Olp that "the spleen is a little enlarged, but not too bad.  I am worried that there may be a tumor on your left kidney!!  We need an MRI to sort this out!!  I will put an order in for the MRI."  Pt appreciative of call and has no questions at this time.

## 2019-05-02 NOTE — Telephone Encounter (Signed)
-----   Message from Volanda Napoleon, MD sent at 05/01/2019  5:02 PM EDT ----- Call - the spleen is a little enlarged, but not too bad.  I am worried that there may be a tumor on your left kidney!!  We need an MRI to sort this out!!  I will put an order in for the MRI.  Steve Watson

## 2019-05-05 ENCOUNTER — Ambulatory Visit (HOSPITAL_BASED_OUTPATIENT_CLINIC_OR_DEPARTMENT_OTHER)
Admission: RE | Admit: 2019-05-05 | Discharge: 2019-05-05 | Disposition: A | Discharge status: Home / Self Care | Payer: Medicare Other | Source: Ambulatory Visit | Attending: Hematology & Oncology | Admitting: Hematology & Oncology

## 2019-05-05 ENCOUNTER — Other Ambulatory Visit: Payer: Self-pay

## 2019-05-05 DIAGNOSIS — N2889 Other specified disorders of kidney and ureter: Secondary | ICD-10-CM

## 2019-05-05 DIAGNOSIS — N281 Cyst of kidney, acquired: Secondary | ICD-10-CM | POA: Diagnosis not present

## 2019-05-05 MED ORDER — GADOBUTROL 1 MMOL/ML IV SOLN
8.0000 mL | Freq: Once | INTRAVENOUS | Status: AC | PRN
  Administered 2019-05-05: 8 mL via INTRAVENOUS

## 2019-05-06 ENCOUNTER — Telehealth: Payer: Self-pay | Admitting: *Deleted

## 2019-05-06 NOTE — Telephone Encounter (Signed)
Patient notified per order of Dr. Marin Olp that "the MRI does NOT show any cancer or tumor in the kidneys!!!""  Pt appreciative of call and has no questions or concerns at this time.

## 2019-05-06 NOTE — Telephone Encounter (Signed)
-----   Message from Volanda Napoleon, MD sent at 05/06/2019  7:00 AM EDT ----- Call - the MRI doe NOT show any cancer or tumor in the kidneys!!!!  Laurey Arrow

## 2019-07-05 ENCOUNTER — Inpatient Hospital Stay (HOSPITAL_BASED_OUTPATIENT_CLINIC_OR_DEPARTMENT_OTHER): Payer: Medicare Other | Admitting: Hematology & Oncology

## 2019-07-05 ENCOUNTER — Inpatient Hospital Stay: Payer: Medicare Other | Attending: Hematology & Oncology

## 2019-07-05 ENCOUNTER — Other Ambulatory Visit: Payer: Self-pay

## 2019-07-05 ENCOUNTER — Encounter: Payer: Self-pay | Admitting: Hematology & Oncology

## 2019-07-05 VITALS — BP 123/64 | HR 81 | Temp 97.5°F | Resp 18 | Ht 68.0 in | Wt 191.0 lb

## 2019-07-05 DIAGNOSIS — D696 Thrombocytopenia, unspecified: Secondary | ICD-10-CM | POA: Diagnosis not present

## 2019-07-05 DIAGNOSIS — N281 Cyst of kidney, acquired: Secondary | ICD-10-CM | POA: Diagnosis not present

## 2019-07-05 DIAGNOSIS — D72819 Decreased white blood cell count, unspecified: Secondary | ICD-10-CM | POA: Diagnosis not present

## 2019-07-05 DIAGNOSIS — Z79899 Other long term (current) drug therapy: Secondary | ICD-10-CM | POA: Insufficient documentation

## 2019-07-05 DIAGNOSIS — K7460 Unspecified cirrhosis of liver: Secondary | ICD-10-CM | POA: Diagnosis not present

## 2019-07-05 DIAGNOSIS — D7381 Neutropenic splenomegaly: Secondary | ICD-10-CM

## 2019-07-05 DIAGNOSIS — R161 Splenomegaly, not elsewhere classified: Secondary | ICD-10-CM | POA: Diagnosis not present

## 2019-07-05 LAB — CBC WITH DIFFERENTIAL (CANCER CENTER ONLY)
Abs Immature Granulocytes: 0.01 10*3/uL (ref 0.00–0.07)
Basophils Absolute: 0 10*3/uL (ref 0.0–0.1)
Basophils Relative: 1 %
Eosinophils Absolute: 0.1 10*3/uL (ref 0.0–0.5)
Eosinophils Relative: 3 %
HCT: 37.8 % — ABNORMAL LOW (ref 39.0–52.0)
Hemoglobin: 13.1 g/dL (ref 13.0–17.0)
Immature Granulocytes: 0 %
Lymphocytes Relative: 36 %
Lymphs Abs: 1.1 10*3/uL (ref 0.7–4.0)
MCH: 34.7 pg — ABNORMAL HIGH (ref 26.0–34.0)
MCHC: 34.7 g/dL (ref 30.0–36.0)
MCV: 100.3 fL — ABNORMAL HIGH (ref 80.0–100.0)
Monocytes Absolute: 0.4 10*3/uL (ref 0.1–1.0)
Monocytes Relative: 13 %
Neutro Abs: 1.4 10*3/uL — ABNORMAL LOW (ref 1.7–7.7)
Neutrophils Relative %: 47 %
Platelet Count: 57 10*3/uL — ABNORMAL LOW (ref 150–400)
RBC: 3.77 MIL/uL — ABNORMAL LOW (ref 4.22–5.81)
RDW: 13.6 % (ref 11.5–15.5)
WBC Count: 3.1 10*3/uL — ABNORMAL LOW (ref 4.0–10.5)
nRBC: 0 % (ref 0.0–0.2)

## 2019-07-05 LAB — CMP (CANCER CENTER ONLY)
ALT: 16 U/L (ref 0–44)
AST: 27 U/L (ref 15–41)
Albumin: 3 g/dL — ABNORMAL LOW (ref 3.5–5.0)
Alkaline Phosphatase: 57 U/L (ref 38–126)
Anion gap: 6 (ref 5–15)
BUN: 10 mg/dL (ref 8–23)
CO2: 23 mmol/L (ref 22–32)
Calcium: 8.6 mg/dL — ABNORMAL LOW (ref 8.9–10.3)
Chloride: 110 mmol/L (ref 98–111)
Creatinine: 0.73 mg/dL (ref 0.61–1.24)
GFR, Est AFR Am: >60 mL/min (ref >60)
GFR, Estimated: >60 mL/min (ref >60)
Glucose, Bld: 87 mg/dL (ref 70–99)
Potassium: 4.2 mmol/L (ref 3.5–5.1)
Sodium: 139 mmol/L (ref 135–145)
Total Bilirubin: 1.8 mg/dL — ABNORMAL HIGH (ref 0.3–1.2)
Total Protein: 5.6 g/dL — ABNORMAL LOW (ref 6.5–8.1)

## 2019-07-05 LAB — SAVE SMEAR(SSMR), FOR PROVIDER SLIDE REVIEW

## 2019-07-05 LAB — LACTATE DEHYDROGENASE: LDH: 164 U/L (ref 98–192)

## 2019-07-05 NOTE — Progress Notes (Signed)
Hematology and Oncology Follow Up Visit  Steve Watson [id number] [birth year] 79 y.o. 07/05/2019   Principle Diagnosis:   Leukopenia and Thrombocytopenia - cirrhosis by ultrasound imaging  Current Therapy:    Observation     Interim History:  Steve Watson is back for follow-up.  So far, everything is going pretty well for him.  We last saw him back in March.  Since then, he has done well.  He has had no problems with bleeding or bruising.  There has been no problems with nausea or vomiting.  He is doing a lot of work outside.  He has had no issues with rashes.  He has had no fever.  I do not think he will get the coronavirus vaccine.  He did have a ultrasound of his abdomen back in March.  There was some splenomegaly but the volume was only 544 cm.  He did have an MRI of the abdomen in April.  There was some mild cirrhosis.  He had mild splenomegaly.  There is nothing in the kidneys that look like there was a tumor or masses.  He had a cyst in the right kidney.  Overall, his performance status is ECOG 1.     Medications:  Current Outpatient Medications:  .  aspirin 81 MG tablet, Take 81 mg by mouth daily., Disp: , Rfl:  .  calcium & magnesium carbonates (MYLANTA) 311-232 MG per tablet, Take 1 tablet by mouth daily., Disp: , Rfl:  .  Cholecalciferol 2000 UNITS TABS, Take 1 tablet (2,000 Units total) by mouth daily at 12 noon., Disp: 30 each, Rfl: 2 .  furosemide (LASIX) 20 MG tablet, Take 1 tablet (20 mg total) by mouth daily., Disp: 30 tablet, Rfl: 0 .  Multiple Vitamins-Minerals (VISION VITAMINS) TABS, Take 2 tablets by mouth daily., Disp: , Rfl:  .  omeprazole (PRILOSEC) 10 MG capsule, Take 10 mg by mouth as needed. , Disp: , Rfl:  .  spironolactone (ALDACTONE) 50 MG tablet, Take 1 tablet (50 mg total) by mouth daily., Disp: 30 tablet, Rfl: 4 .  Zinc 100 MG TABS, Take by mouth., Disp: , Rfl:   Allergies: No Known Allergies  Past Medical History, Surgical history, Social  history, and Family History were reviewed and updated.  Review of Systems: Review of Systems  Constitutional: Negative.   HENT: Negative.   Eyes: Negative.   Respiratory: Negative.   Cardiovascular: Negative.   Gastrointestinal: Negative.   Genitourinary: Negative.   Musculoskeletal: Negative.   Skin: Negative.   Neurological: Negative.   Endo/Heme/Allergies: Negative.   Psychiatric/Behavioral: Negative.      Physical Exam:  height is _0  (1.727 m) and weight is 191 lb (86.6 kg). His temporal temperature is 97.5 F (36.4 C) (abnormal). His blood pressure is 123/64 and his pulse is 81. His respiration is 18 and oxygen saturation is 100%.   Wt Readings from Last 3 Encounters:  07/05/19 191 lb (86.6 kg)  04/05/19 187 lb (84.8 kg)  02/20/19 185 lb (83.9 kg)     Physical Exam Vitals reviewed.  HENT:     Head: Normocephalic and atraumatic.  Eyes:     Pupils: Pupils are equal, round, and reactive to light.  Cardiovascular:     Rate and Rhythm: Normal rate and regular rhythm.     Heart sounds: Normal heart sounds.  Pulmonary:     Effort: Pulmonary effort is normal.     Breath sounds: Normal breath sounds.  Abdominal:     General:  Bowel sounds are normal.     Palpations: Abdomen is soft.  Musculoskeletal:        General: No tenderness or deformity. Normal range of motion.     Cervical back: Normal range of motion.  Lymphadenopathy:     Cervical: No cervical adenopathy.  Skin:    General: Skin is warm and dry.     Findings: No erythema or rash.  Neurological:     Mental Status: He is alert and oriented to person, place, and time.  Psychiatric:        Behavior: Behavior normal.        Thought Content: Thought content normal.        Judgment: Judgment normal.      Lab Results  Component Value Date   WBC 3.1 (L) 07/05/2019   HGB 13.1 07/05/2019   HCT 37.8 (L) 07/05/2019   MCV 100.3 (H) 07/05/2019   PLT 57 (L) 07/05/2019     Chemistry      Component Value  Date/Time   NA 140 04/05/2019 0755   K 4.0 04/05/2019 0755   CL 109 04/05/2019 0755   CO2 24 04/05/2019 0755   BUN 12 04/05/2019 0755   CREATININE 0.76 04/05/2019 0755      Component Value Date/Time   CALCIUM 9.1 04/05/2019 0755   ALKPHOS 70 04/05/2019 0755   AST 28 04/05/2019 0755   ALT 19 04/05/2019 0755   BILITOT 1.8 (H) 04/05/2019 0755       Impression and Plan: Steve Watson is a 79 year old white male.  We have been following him now for several years.  So far, there really has not been any obvious change with his blood counts.  In a years time, his blood count has been holding stable.  His platelet counts certainly may fluctuate.  I do not think that they will drop on a continual basis.   I looked at his blood smear under the microscope.  I do not see anything that looked unusual.  I do not see anything that looked like any kind of progression.  I suppose he may have an element of myelodysplasia.  However, I just do not feel compelled to put him through a bone marrow biopsy right now.  We will plan to get him back in 6 months.    Volanda Napoleon, MD 6/11/20218:09 AM

## 2019-10-22 ENCOUNTER — Telehealth: Payer: Self-pay | Admitting: Physician Assistant

## 2019-10-22 NOTE — Telephone Encounter (Signed)
Pharmacy called stating that they did not receive the office notes from his last 2 visits - checked with cody and he said that the only fax he received was one asking if Mr. eckard was a patient - Fax will need to be resent to [place].

## 2019-12-27 ENCOUNTER — Inpatient Hospital Stay: Payer: Medicare Other | Attending: Hematology & Oncology | Admitting: Hematology & Oncology

## 2019-12-27 ENCOUNTER — Other Ambulatory Visit: Payer: Self-pay

## 2019-12-27 ENCOUNTER — Encounter: Payer: Self-pay | Admitting: Hematology & Oncology

## 2019-12-27 ENCOUNTER — Inpatient Hospital Stay: Payer: Medicare Other

## 2019-12-27 VITALS — BP 141/68 | HR 90 | Temp 98.2°F | Resp 20 | Wt 183.0 lb

## 2019-12-27 DIAGNOSIS — D696 Thrombocytopenia, unspecified: Secondary | ICD-10-CM

## 2019-12-27 DIAGNOSIS — Z79899 Other long term (current) drug therapy: Secondary | ICD-10-CM | POA: Diagnosis not present

## 2019-12-27 DIAGNOSIS — K7460 Unspecified cirrhosis of liver: Secondary | ICD-10-CM | POA: Diagnosis not present

## 2019-12-27 DIAGNOSIS — D72819 Decreased white blood cell count, unspecified: Secondary | ICD-10-CM | POA: Diagnosis not present

## 2019-12-27 LAB — CMP (CANCER CENTER ONLY)
ALT: 20 U/L (ref 0–44)
AST: 30 U/L (ref 15–41)
Albumin: 3.1 g/dL — ABNORMAL LOW (ref 3.5–5.0)
Alkaline Phosphatase: 82 U/L (ref 38–126)
Anion gap: 7 (ref 5–15)
BUN: 10 mg/dL (ref 8–23)
CO2: 24 mmol/L (ref 22–32)
Calcium: 8.8 mg/dL — ABNORMAL LOW (ref 8.9–10.3)
Chloride: 109 mmol/L (ref 98–111)
Creatinine: 0.72 mg/dL (ref 0.61–1.24)
GFR, Estimated: >60 mL/min (ref >60)
Glucose, Bld: 81 mg/dL (ref 70–99)
Potassium: 4.1 mmol/L (ref 3.5–5.1)
Sodium: 140 mmol/L (ref 135–145)
Total Bilirubin: 2.3 mg/dL — ABNORMAL HIGH (ref 0.3–1.2)
Total Protein: 5.8 g/dL — ABNORMAL LOW (ref 6.5–8.1)

## 2019-12-27 LAB — CBC WITH DIFFERENTIAL (CANCER CENTER ONLY)
Abs Immature Granulocytes: 0 10*3/uL (ref 0.00–0.07)
Basophils Absolute: 0.1 10*3/uL (ref 0.0–0.1)
Basophils Relative: 2 %
Eosinophils Absolute: 0.1 10*3/uL (ref 0.0–0.5)
Eosinophils Relative: 4 %
HCT: 40 % (ref 39.0–52.0)
Hemoglobin: 13.8 g/dL (ref 13.0–17.0)
Immature Granulocytes: 0 %
Lymphocytes Relative: 29 %
Lymphs Abs: 0.8 10*3/uL (ref 0.7–4.0)
MCH: 34.3 pg — ABNORMAL HIGH (ref 26.0–34.0)
MCHC: 34.5 g/dL (ref 30.0–36.0)
MCV: 99.5 fL (ref 80.0–100.0)
Monocytes Absolute: 0.3 10*3/uL (ref 0.1–1.0)
Monocytes Relative: 11 %
Neutro Abs: 1.5 10*3/uL — ABNORMAL LOW (ref 1.7–7.7)
Neutrophils Relative %: 54 %
Platelet Count: 53 10*3/uL — ABNORMAL LOW (ref 150–400)
RBC: 4.02 MIL/uL — ABNORMAL LOW (ref 4.22–5.81)
RDW: 13.7 % (ref 11.5–15.5)
WBC Count: 2.8 10*3/uL — ABNORMAL LOW (ref 4.0–10.5)
nRBC: 0 % (ref 0.0–0.2)

## 2019-12-27 LAB — SAVE SMEAR(SSMR), FOR PROVIDER SLIDE REVIEW

## 2019-12-27 LAB — PLATELET BY CITRATE

## 2019-12-27 LAB — LACTATE DEHYDROGENASE: LDH: 169 U/L (ref 98–192)

## 2019-12-27 NOTE — Progress Notes (Signed)
Hematology and Oncology Follow Up Visit  Steve Watson [id number] 11/15/1940 79 y.o. 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  We see him every 6 months.  Since we last saw him, he has been doing quite well.  Had a nice summer.  He basically stayed around the house.  He has had no problems with bleeding or bruising.  He has had no issues with cough.  Has had no problems with the coronavirus.  Is been no change in medications.  He has had no problems with nausea or vomiting.  There is been no leg swelling.  He has been quite active.  He does a lot of work around his house.  Overall, his performance status is ECOG 1.      Medications:  Current Outpatient Medications:  .  aspirin 81 MG tablet, Take 81 mg by mouth daily., Disp: , Rfl:  .  calcium & magnesium carbonates (MYLANTA) 311-232 MG per tablet, Take 1 tablet by mouth daily., Disp: , Rfl:  .  Cholecalciferol 2000 UNITS TABS, Take 1 tablet (2,000 Units total) by mouth daily at 12 noon., Disp: 30 each, Rfl: 2 .  Multiple Vitamins-Minerals (VISION VITAMINS) TABS, Take 2 tablets by mouth daily., Disp: , Rfl:  .  omeprazole (PRILOSEC) 10 MG capsule, Take 10 mg by mouth as needed. , Disp: , Rfl:  .  Zinc 100 MG TABS, Take by mouth., Disp: , Rfl:   Allergies: No Known Allergies  Past Medical History, Surgical history, Social history, and Family History were reviewed and updated.  Review of Systems: Review of Systems  Constitutional: Negative.   HENT: Negative.   Eyes: Negative.   Respiratory: Negative.   Cardiovascular: Negative.   Gastrointestinal: Negative.   Genitourinary: Negative.   Musculoskeletal: Negative.   Skin: Negative.   Neurological: Negative.   Endo/Heme/Allergies: Negative.   Psychiatric/Behavioral: Negative.      Physical Exam:  weight is 183 lb  (83 kg). His oral temperature is 98.2 F (36.8 C). His blood pressure is 141/68 (abnormal) and his pulse is 90. His respiration is 20 and oxygen saturation is 100%.   Wt Readings from Last 3 Encounters:  12/27/19 183 lb (83 kg)  07/05/19 191 lb (86.6 kg)  04/05/19 187 lb (84.8 kg)     Physical Exam Vitals reviewed.  HENT:     Head: Normocephalic and atraumatic.  Eyes:     Pupils: Pupils are equal, round, and reactive to light.  Cardiovascular:     Rate and Rhythm: Normal rate and regular rhythm.     Heart sounds: Normal heart sounds.  Pulmonary:     Effort: Pulmonary effort is normal.     Breath sounds: Normal breath sounds.  Abdominal:     General: Bowel sounds are normal.     Palpations: Abdomen is soft.  Musculoskeletal:        General: No tenderness or deformity. Normal range of motion.     Cervical back: Normal range of motion.  Lymphadenopathy:     Cervical: No cervical adenopathy.  Skin:    General: Skin is warm and dry.     Findings: No erythema or rash.  Neurological:     Mental Status: He is alert and oriented to person, place, and time.  Psychiatric:        Behavior: Behavior normal.          Thought Content: Thought content normal.        Judgment: Judgment normal.      Lab Results  Component Value Date   WBC 3.1 (L) 07/05/2019   HGB 13.1 07/05/2019   HCT 37.8 (L) 07/05/2019   MCV 100.3 (H) 07/05/2019   PLT 57 (L) 07/05/2019     Chemistry      Component Value Date/Time   NA 139 07/05/2019 0743   K 4.2 07/05/2019 0743   CL 110 07/05/2019 0743   CO2 23 07/05/2019 0743   BUN 10 07/05/2019 0743   CREATININE 0.73 07/05/2019 0743      Component Value Date/Time   CALCIUM 8.6 (L) 07/05/2019 0743   ALKPHOS 57 07/05/2019 0743   AST 27 07/05/2019 0743   ALT 16 07/05/2019 0743   BILITOT 1.8 (H) 07/05/2019 0743       Impression and Plan: Steve Watson is a 78 year old white male.  We have been following him now for several years.  So far, there  really has not been any obvious change with his blood counts.  In a years time, his blood count has been holding stable.  His platelet counts certainly may fluctuate.  I do not think that they will drop on a continual basis.   I looked at his blood smear under the microscope.  I do not see anything that looked unusual.  I do not see anything that looked like any kind of progression.  I suppose he may have an element of myelodysplasia.  However, I just do not feel compelled to put him through a bone marrow biopsy right now.  We will plan to get him back in 6 months.    Volanda Napoleon, MD 12/3/20218:09 AM

## 2019-12-30 ENCOUNTER — Telehealth: Payer: Self-pay | Admitting: Hematology & Oncology

## 2019-12-30 NOTE — Telephone Encounter (Signed)
Appointments scheduled calendar printed & mailed per 12/3 los

## 2020-01-29 ENCOUNTER — Telehealth: Payer: Self-pay | Admitting: Physician Assistant

## 2020-01-29 NOTE — Telephone Encounter (Signed)
Left message for patient to schedule Annual Wellness Visit.  Please schedule with Nurse Health Advisor Martha Stanley, RN at [place]  

## 2020-02-24 ENCOUNTER — Ambulatory Visit (INDEPENDENT_AMBULATORY_CARE_PROVIDER_SITE_OTHER): Payer: Medicare Other

## 2020-02-24 ENCOUNTER — Other Ambulatory Visit: Payer: Self-pay

## 2020-02-24 VITALS — BP 160/90 | HR 100 | Temp 97.3°F | Resp 16 | Ht 68.0 in | Wt 191.4 lb

## 2020-02-24 DIAGNOSIS — Z0000 Encounter for general adult medical examination without abnormal findings: Secondary | ICD-10-CM | POA: Diagnosis not present

## 2020-02-24 NOTE — Patient Instructions (Signed)
Steve Watson , Thank you for taking time to come for your Medicare Wellness Visit. I appreciate your ongoing commitment to your health goals. Please review the following plan we discussed and let me know if I can assist you in the future.   Screening recommendations/referrals: Colonoscopy: No longer required Recommended yearly ophthalmology/optometry visit for glaucoma screening and checkup Recommended yearly dental visit for hygiene and checkup  Vaccinations: Influenza vaccine: Declined Pneumococcal vaccine: Completed vaccines Tdap vaccine: Discuss with pharmacy Shingles vaccine:  Discuss with pharmacy  Covid-19: Completed vaccines  Advanced directives: Declined information today.  Conditions/risks identified: See problem list  Next appointment: Follow up in one year for your annual wellness visit.   Preventive Care 22 Years and Older, Male Preventive care refers to lifestyle choices and visits with your health care provider that can promote health and wellness. What does preventive care include?  A yearly physical exam. This is also called an annual well check.  Dental exams once or twice a year.  Routine eye exams. Ask your health care provider how often you should have your eyes checked.  Personal lifestyle choices, including:  Daily care of your teeth and gums.  Regular physical activity.  Eating a healthy diet.  Avoiding tobacco and drug use.  Limiting alcohol use.  Practicing safe sex.  Taking low doses of aspirin every day.  Taking vitamin and mineral supplements as recommended by your health care provider. What happens during an annual well check? The services and screenings done by your health care provider during your annual well check will depend on your age, overall health, lifestyle risk factors, and family history of disease. Counseling  Your health care provider may ask you questions about your:  Alcohol use.  Tobacco use.  Drug  use.  Emotional well-being.  Home and relationship well-being.  Sexual activity.  Eating habits.  History of falls.  Memory and ability to understand (cognition).  Work and work Statistician. Screening  You may have the following tests or measurements:  Height, weight, and BMI.  Blood pressure.  Lipid and cholesterol levels. These may be checked every 5 years, or more frequently if you are over 4 years old.  Skin check.  Lung cancer screening. You may have this screening every year starting at age 18 if you have a 30-pack-year history of smoking and currently smoke or have quit within the past 15 years.  Fecal occult blood test (FOBT) of the stool. You may have this test every year starting at age 33.  Flexible sigmoidoscopy or colonoscopy. You may have a sigmoidoscopy every 5 years or a colonoscopy every 10 years starting at age 49.  Prostate cancer screening. Recommendations will vary depending on your family history and other risks.  Hepatitis C blood test.  Hepatitis B blood test.  Sexually transmitted disease (STD) testing.  Diabetes screening. This is done by checking your blood sugar (glucose) after you have not eaten for a while (fasting). You may have this done every 1-3 years.  Abdominal aortic aneurysm (AAA) screening. You may need this if you are a current or former smoker.  Osteoporosis. You may be screened starting at age 21 if you are at high risk. Talk with your health care provider about your test results, treatment options, and if necessary, the need for more tests. Vaccines  Your health care provider may recommend certain vaccines, such as:  Influenza vaccine. This is recommended every year.  Tetanus, diphtheria, and acellular pertussis (Tdap, Td) vaccine. You may need  a Td booster every 10 years.  Zoster vaccine. You may need this after age 79.  Pneumococcal 13-valent conjugate (PCV13) vaccine. One dose is recommended after age  37.  Pneumococcal polysaccharide (PPSV23) vaccine. One dose is recommended after age 77. Talk to your health care provider about which screenings and vaccines you need and how often you need them. This information is not intended to replace advice given to you by your health care provider. Make sure you discuss any questions you have with your health care provider. Document Released: 02/06/2015 Document Revised: 09/30/2015 Document Reviewed: 11/11/2014 Elsevier Interactive Patient Education  2017 [place] Prevention in the Home Falls can cause injuries. They can happen to people of all ages. There are many things you can do to make your home safe and to help prevent falls. What can I do on the outside of my home?  Regularly fix the edges of walkways and driveways and fix any cracks.  Remove anything that might make you trip as you walk through a door, such as a raised step or threshold.  Trim any bushes or trees on the path to your home.  Use bright outdoor lighting.  Clear any walking paths of anything that might make someone trip, such as rocks or tools.  Regularly check to see if handrails are loose or broken. Make sure that both sides of any steps have handrails.  Any raised decks and porches should have guardrails on the edges.  Have any leaves, snow, or ice cleared regularly.  Use sand or salt on walking paths during winter.  Clean up any spills in your garage right away. This includes oil or grease spills. What can I do in the bathroom?  Use night lights.  Install grab bars by the toilet and in the tub and shower. Do not use towel bars as grab bars.  Use non-skid mats or decals in the tub or shower.  If you need to sit down in the shower, use a plastic, non-slip stool.  Keep the floor dry. Clean up any water that spills on the floor as soon as it happens.  Remove soap buildup in the tub or shower regularly.  Attach bath mats securely with double-sided  non-slip rug tape.  Do not have throw rugs and other things on the floor that can make you trip. What can I do in the bedroom?  Use night lights.  Make sure that you have a light by your bed that is easy to reach.  Do not use any sheets or blankets that are too big for your bed. They should not hang down onto the floor.  Have a firm chair that has side arms. You can use this for support while you get dressed.  Do not have throw rugs and other things on the floor that can make you trip. What can I do in the kitchen?  Clean up any spills right away.  Avoid walking on wet floors.  Keep items that you use a lot in easy-to-reach places.  If you need to reach something above you, use a strong step stool that has a grab bar.  Keep electrical cords out of the way.  Do not use floor polish or wax that makes floors slippery. If you must use wax, use non-skid floor wax.  Do not have throw rugs and other things on the floor that can make you trip. What can I do with my stairs?  Do not leave any items on the  stairs.  Make sure that there are handrails on both sides of the stairs and use them. Fix handrails that are broken or loose. Make sure that handrails are as long as the stairways.  Check any carpeting to make sure that it is firmly attached to the stairs. Fix any carpet that is loose or worn.  Avoid having throw rugs at the top or bottom of the stairs. If you do have throw rugs, attach them to the floor with carpet tape.  Make sure that you have a light switch at the top of the stairs and the bottom of the stairs. If you do not have them, ask someone to add them for you. What else can I do to help prevent falls?  Wear shoes that:  Do not have high heels.  Have rubber bottoms.  Are comfortable and fit you well.  Are closed at the toe. Do not wear sandals.  If you use a stepladder:  Make sure that it is fully opened. Do not climb a closed stepladder.  Make sure that both  sides of the stepladder are locked into place.  Ask someone to hold it for you, if possible.  Clearly mark and make sure that you can see:  Any grab bars or handrails.  First and last steps.  Where the edge of each step is.  Use tools that help you move around (mobility aids) if they are needed. These include:  Canes.  Walkers.  Scooters.  Crutches.  Turn on the lights when you go into a dark area. Replace any light bulbs as soon as they burn out.  Set up your furniture so you have a clear path. Avoid moving your furniture around.  If any of your floors are uneven, fix them.  If there are any pets around you, be aware of where they are.  Review your medicines with your doctor. Some medicines can make you feel dizzy. This can increase your chance of falling. Ask your doctor what other things that you can do to help prevent falls. This information is not intended to replace advice given to you by your health care provider. Make sure you discuss any questions you have with your health care provider. Document Released: 11/06/2008 Document Revised: 06/18/2015 Document Reviewed: 02/14/2014 Elsevier Interactive Patient Education  2017 [employer].

## 2020-02-24 NOTE — Progress Notes (Signed)
Subjective:   Macklen Wilhoite is a 80 y.o. male who presents for Medicare Annual/Subsequent preventive examination.  Review of Systems     Cardiac Risk Factors include: advanced age (>3mn, >>72women);male gender     Objective:    Today's Vitals   02/24/20 1358 02/24/20 1420  BP: (!) 168/92 (!) 160/90  Pulse: 100   Resp: 16   Temp: (!) 97.3 F (36.3 C)   TempSrc: Oral   SpO2: 99%   Weight: 191 lb 6.4 oz (86.8 kg)   Height: 5' 8"  (1.727 m)    Body mass index is 29.1 kg/m.  Advanced Directives 02/24/2020 12/27/2019 07/05/2019 02/20/2019 07/06/2018 01/04/2018 08/10/2017  Does Patient Have a Medical Advance Directive? No No Yes No Yes No No  Type of Advance Directive - -Public librarianLiving will - Living will;Healthcare Power of Attorney - -  Does patient want to make changes to medical advance directive? - - No - Patient declined - - - -  Copy of [place] Chart? - - No - copy requested - No - copy requested - -  Would patient like information on creating a medical advance directive? No - Patient declined No - Patient declined - Yes (MAU/Ambulatory/Procedural Areas - Information given) - No - Patient declined -    Current Medications (verified) Outpatient Encounter Medications as of 02/24/2020  Medication Sig  . aspirin 81 MG tablet Take 81 mg by mouth daily.  . calcium & magnesium carbonates (MYLANTA) 311-232 MG per tablet Take 1 tablet by mouth daily.  . Cholecalciferol 2000 UNITS TABS Take 1 tablet (2,000 Units total) by mouth daily at 12 noon.  . Multiple Vitamins-Minerals (VISION VITAMINS) TABS Take 2 tablets by mouth daily.  .Marland Kitchenomeprazole (PRILOSEC) 10 MG capsule Take 10 mg by mouth as needed.   . Zinc 100 MG TABS Take by mouth.   No facility-administered encounter medications on file as of 02/24/2020.    Allergies (verified) Patient has no known allergies.   History: Past Medical History:  Diagnosis Date  . Chicken pox   .  GERD (gastroesophageal reflux disease)   . 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 NO PAST SURGERIES     Family History  Problem Relation Age of Onset  . Hypertension Mother   . Hypertension Father   . Hypertension Brother   . Diabetes Brother   . Healthy Child   . Healthy Grandchild    Social History   Socioeconomic History  . Marital status: Married    Spouse name: Not on file  . Number of children: Not on file  . Years of education: Not on file  . Highest education level: Not on file  Occupational History  . Not on file  Tobacco Use  . Smoking status: Never Smoker  . Smokeless tobacco: Never Used  . Tobacco comment: NEVER USED TOBACCO  Vaping Use  . Vaping Use: Never used  Substance and Sexual Activity  . Alcohol use: No    Alcohol/week: 0.0 standard drinks  . Drug use: No  . Sexual activity: Yes  Other Topics Concern  . 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 Difficulty of Paying Living  Expenses: Not hard at all  Food Insecurity: No Food Insecurity  . Worried About Charity fundraiser in the Last Year: Never true  . Ran Out of Food in the Last Year: Never true  Transportation Needs: No Transportation Needs  . Lack of Transportation (Medical): No  . Lack of Transportation (Non-Medical): No  Physical Activity: Sufficiently Active  . Days of Exercise per Week: 3 days  . Minutes of Exercise per Session: 50 min  Stress: No Stress Concern Present  . Feeling of Stress : Not at all  Social Connections: Moderately Isolated  . Frequency of Communication with Friends and Family: More than three times a week  . Frequency of Social Gatherings with Friends and Family: More than three times a week  . Attends Religious  Services: Never  . Active Member of Clubs or Organizations: No  . Attends Archivist Meetings: Never  . Marital Status: Married    Tobacco Counseling Counseling given: Not Answered Comment: NEVER USED TOBACCO   Clinical Intake:  Pre-visit preparation completed: Yes  Pain : No/denies pain     Nutritional Status: BMI 25 -29 Overweight Nutritional Risks: None Diabetes: No  How often do you need to have someone help you when you read instructions, pamphlets, or other written materials from your doctor or pharmacy?: 1 - Never  Diabetic?No  Interpreter Needed?: No  Information entered by :: Caroleen Hamman LPN   Activities of Daily Living In your present state of health, do you have any difficulty performing the following activities: 02/24/2020  Hearing? N  Vision? N  Difficulty concentrating or making decisions? N  Walking or climbing stairs? N  Dressing or bathing? N  Doing errands, shopping? N  Preparing Food and eating ? N  Using the Toilet? N  In the past six months, have you accidently leaked urine? N  Do you have problems with loss of bowel control? N  Managing your Medications? N  Managing your Finances? N  Housekeeping or managing your Housekeeping? N  Some recent data might be hidden    Patient Care Team: Delorse Limber as PCP - General (Physician Assistant) Volanda Napoleon, MD as Consulting Physician (Oncology)  Indicate any recent Medical Services you may have received from other than [facility] providers in the past year (date may be approximate).     Assessment:   This is a routine wellness examination for [facility].  Hearing/Vision screen  Hearing Screening   125Hz  250Hz  500Hz  1000Hz  2000Hz  3000Hz  4000Hz  6000Hz  8000Hz   Right ear:           Left ear:           Comments: No issues  Vision Screening Comments: Reading glasses Last eye exam-2-3 years   Dietary issues and exercise activities discussed: Current Exercise Habits: Home  exercise routine, Type of exercise: strength training/weights, Time (Minutes): 50, Frequency (Times/Week): 3, Weekly Exercise (Minutes/Week): 150, Intensity: Mild, Exercise limited by: None identified  Goals    . Patient Stated     Maintain current level of activity      Depression Screen PHQ 2/9 Scores 02/24/2020 02/20/2019 07/12/2018 08/08/2017 02/06/2015  PHQ - 2 Score 0 0 0 0 0  PHQ- 9 Score - - 0 - -    Fall Risk Fall Risk  02/24/2020 02/20/2019 08/08/2017 10/08/2015 08/17/2015  Falls in the past year? 0 0 No No No  Number falls in past yr: 0 0 - - -  Injury with Fall? 0 0 - - -  Follow up Falls prevention discussed Falls evaluation completed;Education provided;Falls prevention discussed - - -    FALL RISK PREVENTION PERTAINING TO THE HOME:  Any stairs in or around the home? No  Home free of loose throw rugs in walkways, pet beds, electrical cords, etc? Yes  Adequate lighting in your home to reduce risk of falls? Yes   ASSISTIVE DEVICES UTILIZED TO PREVENT FALLS:  Life alert? No  Use of a cane, walker or w/c? No  Grab bars in the bathroom? Yes  Shower chair or bench in shower? No  Elevated toilet seat or a handicapped toilet? No   TIMED UP AND GO:  Was the test performed? Yes .  Length of time to ambulate 10 feet: 10 sec.   Gait steady and fast without use of assistive device  Cognitive Function:Normal cognitive status assessed by direct observation by this Nurse Health Advisor. No abnormalities found.   MMSE - Mini Mental State Exam 08/08/2017  Orientation to time 5  Orientation to Place 4  Registration 3  Attention/ Calculation 4  Recall 1  Language- name 2 objects 2  Language- repeat 1  Language- follow 3 step command 3  Language- read & follow direction 1  Write a sentence 1  Copy design 1  Total score 26     6CIT Screen 02/20/2019  What Year? 0 points  What month? 0 points  What time? 0 points  Count back from 20 0 points  Months in reverse 0 points   Repeat phrase 0 points  Total Score 0    Immunizations Immunization History  Administered Date(s) Administered  . Moderna Sars-Covid-2 Vaccination 10/20/2019, 11/17/2019  . Pneumococcal Conjugate-13 02/06/2015, 08/08/2017  . Pneumococcal Polysaccharide-23 02/20/2019    TDAP status: Due, Education has been provided regarding the importance of this vaccine. Advised may receive this vaccine at local pharmacy or Health Dept. Aware to provide a copy of the vaccination record if obtained from local pharmacy or Health Dept. Verbalized acceptance and understanding.  Flu Vaccine status: Declined, Education has been provided regarding the importance of this vaccine but patient still declined. Advised may receive this vaccine at local pharmacy or Health Dept. Aware to provide a copy of the vaccination record if obtained from local pharmacy or Health Dept. Verbalized acceptance and understanding.  Pneumococcal vaccine status: Up to date  Covid-19 vaccine status: Completed vaccines  Qualifies for Shingles Vaccine? Yes   Zostavax completed No   Shingrix Completed?: No.    Education has been provided regarding the importance of this vaccine. Patient has been advised to call insurance company to determine out of pocket expense if they have not yet received this vaccine. Advised may also receive vaccine at local pharmacy or Health Dept. Verbalized acceptance and understanding.  Screening Tests Health Maintenance  Topic Date Due  . Hepatitis C Screening  Never done  . TETANUS/TDAP  Never done  . COVID-19 Vaccine (3 - Moderna risk 4-dose series) 12/15/2019  . INFLUENZA VACCINE  04/23/2020 (Originally 08/25/2019)  . DTAP VACCINES (1) 09/15/2078 (Originally 05/28/1940)  . PNA vac Low Risk Adult  Completed  . DTaP/Tdap/Td  Discontinued    Health Maintenance  Health Maintenance Due  Topic Date Due  . Hepatitis C Screening  Never done  . TETANUS/TDAP  Never done  . COVID-19 Vaccine (3 - Moderna risk  4-dose series) 12/15/2019    Colorectal cancer screening: No longer required.   Lung Cancer Screening: (Low Dose CT Chest recommended if Age 37-80 years, 74  pack-year currently smoking OR have quit w/in 15years.) does not qualify.     Additional Screening:  Hepatitis C Screening: does not qualify  Vision Screening: Recommended annual ophthalmology exams for early detection of glaucoma and other disorders of the eye. Is the patient up to date with their annual eye exam?  No  Who is the provider or what is the name of the office in which the patient attends annual eye exams? Unsure of name If pt is not established with a provider, would they like to be referred to a provider to establish care? No .   Dental Screening: Recommended annual dental exams for proper oral hygiene  Community Resource Referral / Chronic Care Management: CRR required this visit?  No   CCM required this visit?  No      Plan:     I have personally reviewed and noted the following in the patient's chart:   . Medical and social history . Use of alcohol, tobacco or illicit drugs  . Current medications and supplements . Functional ability and status . Nutritional status . Physical activity . Advanced directives . List of other physicians . Hospitalizations, surgeries, and ER visits in previous 12 months . Vitals . Screenings to include cognitive, depression, and falls . Referrals and appointments  In addition, I have reviewed and discussed with patient certain preventive protocols, quality metrics, and best practice recommendations. A written personalized care plan for preventive services as well as general preventive health recommendations were provided to patient.     Marta Antu, LPN   06/03/2583  Nurse Health Advisor  Nurse Notes: None

## 2020-06-17 ENCOUNTER — Other Ambulatory Visit: Payer: Self-pay

## 2020-06-17 ENCOUNTER — Ambulatory Visit (INDEPENDENT_AMBULATORY_CARE_PROVIDER_SITE_OTHER): Payer: Medicare Other | Admitting: Medical

## 2020-06-17 ENCOUNTER — Encounter: Payer: Self-pay | Admitting: Medical

## 2020-06-17 VITALS — BP 122/82 | HR 97 | Temp 98.1°F | Resp 18 | Ht 68.0 in | Wt 189.6 lb

## 2020-06-17 DIAGNOSIS — R739 Hyperglycemia, unspecified: Secondary | ICD-10-CM | POA: Diagnosis not present

## 2020-06-17 DIAGNOSIS — M25561 Pain in right knee: Secondary | ICD-10-CM

## 2020-06-17 DIAGNOSIS — G8929 Other chronic pain: Secondary | ICD-10-CM

## 2020-06-17 DIAGNOSIS — K219 Gastro-esophageal reflux disease without esophagitis: Secondary | ICD-10-CM

## 2020-06-17 DIAGNOSIS — D696 Thrombocytopenia, unspecified: Secondary | ICD-10-CM | POA: Diagnosis not present

## 2020-06-17 DIAGNOSIS — D7381 Neutropenic splenomegaly: Secondary | ICD-10-CM | POA: Diagnosis not present

## 2020-06-17 NOTE — Progress Notes (Signed)
Subjective:    Patient ID: Steve Watson, male    DOB: Jun 08, 1940, 80 y.o.   MRN: [id number]  HPI  Pt in for the first time. 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    Saw Steve Aquas PA in past.  Pt states some occasional heart burn/reflux. About once a month. prilosec used prn.   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 Marin Watson last note below in ".  "Steve Watson is a 80 year old white male.  We have been following him now for several years.  So far, there really has not been any obvious change with his blood counts.  In a years time, his blood count has been holding stable.  His platelet counts certainly may fluctuate.  I do not think that they will drop on a continual basis.   I looked at his blood smear under the microscope.  I do not see anything that looked unusual.  I do not see anything that looked like any kind of progression.  I suppose he may have an element of myelodysplasia.  However, I just do not feel compelled to put him through a bone marrow biopsy right now."  MRI abdomen in april IMPRESSION: Limited evaluation due to motion degradation.  18 mm right upper pole renal cyst, benign (Bosniak I). No enhancing renal lesions.  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  Mild splenomegaly.  Pt states never drank any alcohol.   Mild elevated sugar in the past.  Pt had 2 doses of moderna vaccine.     Review of Systems  Constitutional: Negative for chills, fatigue and fever.  HENT: Negative for congestion and drooling.   Respiratory: Negative for cough, chest tightness, shortness of breath and wheezing.   Cardiovascular: Negative for chest pain and palpitations.  Gastrointestinal: Negative for abdominal pain and constipation.  Genitourinary: Negative for dysuria, flank pain, hematuria and testicular pain.  Musculoskeletal: Negative for back pain, joint swelling, neck pain and neck stiffness.       Rt  knee pain  Skin: Negative for rash.  Neurological: Negative for dizziness, seizures, syncope, weakness and headaches.  Hematological: Negative for adenopathy. Does not bruise/bleed easily.  Psychiatric/Behavioral: Negative for behavioral problems, decreased concentration and sleep disturbance. The patient is not nervous/anxious.     Past Medical History:  Diagnosis Date  . Chicken pox   . GERD (gastroesophageal reflux disease)   . 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 Marital status: Married    Spouse name: Not on file  . Number of children: Not on file  . Years of education: Not on file  . Highest education level: Not on file  Occupational History  . Not on file  Tobacco Use  . Smoking status: Never Smoker  . Smokeless tobacco: Never Used  . Tobacco comment: NEVER USED TOBACCO  Vaping Use  . Vaping Use: Never used  Substance and Sexual Activity  . Alcohol use: No    Alcohol/week: 0.0 standard drinks  . Drug use: No  . Sexual activity: Yes  Other Topics Concern  . Not on file  Social History Narrative   2 Sons; grandson works in Recruitment consultant in [employer] for 8 years    Enjoys weightlifting    Social Determinants of Radio broadcast assistant Strain: [place]   . Difficulty of Paying Living Expenses: Not hard at all  Food Insecurity: No Food Insecurity  .  Worried About Charity fundraiser in the Last Year: Never true  . Ran Out of Food in the Last Year: Never true  Transportation Needs: No Transportation Needs  . Lack of Transportation (Medical): No  . Lack of Transportation (Non-Medical): No  Physical Activity: Sufficiently Active  . Days of Exercise per Week: 3 days  . Minutes of Exercise per Session: 50 min  Stress: No Stress Concern Present  . Feeling of Stress : Not at all  Social Connections: Moderately Isolated  . Frequency of Communication with Friends and Family: More than  three times a week  . Frequency of Social Gatherings with Friends and Family: More than three times a week  . Attends Religious Services: Never  . Active Member of Clubs or Organizations: No  . Attends Archivist Meetings: Never  . Marital Status: Married  Human resources officer Violence: Not At Risk  . Fear of Current or Ex-Partner: No  . Emotionally Abused: No  . Physically Abused: No  . Sexually Abused: No    Past Surgical History:  Procedure Laterality Date  . NO PAST SURGERIES      Family History  Problem Relation Age of Onset  . Hypertension Mother   . Hypertension Father   . Hypertension Brother   . Diabetes Brother   . Healthy Child   . Healthy Grandchild     No Known Allergies  Current Outpatient Medications on File Prior to Visit  Medication Sig Dispense Refill  . aspirin 81 MG tablet Take 81 mg by mouth daily.    . calcium & magnesium carbonates (MYLANTA) 311-232 MG per tablet Take 1 tablet by mouth daily.    . Cholecalciferol 2000 UNITS TABS Take 1 tablet (2,000 Units total) by mouth daily at 12 noon. 30 each 2  . Multiple Vitamins-Minerals (VISION VITAMINS) TABS Take 2 tablets by mouth daily.    Marland Kitchen omeprazole (PRILOSEC) 10 MG capsule Take 10 mg by mouth as needed.     . Zinc 100 MG TABS Take by mouth.     No current facility-administered medications on file prior to visit.    BP 122/82 (BP Location: Left Arm, Patient Position: Sitting, Cuff Size: Normal)   Pulse 97   Temp 98.1 F (36.7 C) (Oral)   Resp 18   Ht 5' 8"  (1.727 m)   Wt 189 lb 9.6 oz (86 kg)   SpO2 100%   BMI 28.83 kg/m       Objective:   Physical Exam  General Mental Status- Alert. General Appearance- Not in acute distress.   Skin General: Color- Normal Color. Moisture- Normal Moisture.  Neck Carotid Arteries- Normal color. Moisture- Normal Moisture. No carotid bruits. No JVD.  Chest and Lung Exam Auscultation: Breath  Sounds:-Normal.  Cardiovascular Auscultation:Rythm- Regular. Murmurs & Other Heart Sounds:Auscultation of the heart reveals- No Murmurs.  Abdomen Inspection:-Inspeection Normal. Palpation/Percussion:Note:No mass. Palpation and Percussion of the abdomen reveal- Non Tender, Non Distended + BS, no rebound or guarding.  Neurologic Cranial Nerve exam:- CN III-XII intact(No nystagmus), symmetric smile. Strength:- 5/5 equal and symmetric strength both upper and lower extremities.    Rt knee- some crepitus on flexion and extension.       Assessment & Plan:  For rare gerd eat healthy diet as discussed. Use omeprazole over the counter as needed.  For splenomegaly and low platelets follow up with Dr. Marin Watson as scheduled.  For elevated sugar recommend low sugar diet.   For chronic rt knee pain can use  topical voltaren on occasion if needed. If pain worsens let me know and can offer xray of knee.  Follow up 4 months or as needed  Reviewed prior labs today.  Mackie Pai, PA-C

## 2020-06-17 NOTE — Patient Instructions (Signed)
For rare gerd eat healthy diet as discussed. Use omeprazole over the counter as needed.  For splenomegaly and low platelets follow up with Dr. Marin Olp as scheduled.  For elevated sugar recommend low sugar diet.   For chronic rt knee pain can use topical voltaren on occasion if needed. If pain worsens let me know and can offer xray of knee.  Follow up 4 months or as needed  Reviewed prior labs today.

## 2020-06-26 ENCOUNTER — Inpatient Hospital Stay (HOSPITAL_BASED_OUTPATIENT_CLINIC_OR_DEPARTMENT_OTHER): Payer: Medicare Other | Admitting: Hematology & Oncology

## 2020-06-26 ENCOUNTER — Other Ambulatory Visit: Payer: Self-pay

## 2020-06-26 ENCOUNTER — Encounter: Payer: Self-pay | Admitting: Hematology & Oncology

## 2020-06-26 ENCOUNTER — Inpatient Hospital Stay: Payer: Medicare Other | Attending: Hematology & Oncology

## 2020-06-26 VITALS — BP 151/73 | HR 92 | Temp 98.1°F | Resp 20 | Wt 185.8 lb

## 2020-06-26 DIAGNOSIS — K7460 Unspecified cirrhosis of liver: Secondary | ICD-10-CM | POA: Insufficient documentation

## 2020-06-26 DIAGNOSIS — Z79899 Other long term (current) drug therapy: Secondary | ICD-10-CM | POA: Insufficient documentation

## 2020-06-26 DIAGNOSIS — D72819 Decreased white blood cell count, unspecified: Secondary | ICD-10-CM | POA: Insufficient documentation

## 2020-06-26 DIAGNOSIS — M7989 Other specified soft tissue disorders: Secondary | ICD-10-CM | POA: Diagnosis not present

## 2020-06-26 DIAGNOSIS — D696 Thrombocytopenia, unspecified: Secondary | ICD-10-CM

## 2020-06-26 LAB — CBC WITH DIFFERENTIAL (CANCER CENTER ONLY)
Abs Immature Granulocytes: 0 10*3/uL (ref 0.00–0.07)
Basophils Absolute: 0 10*3/uL (ref 0.0–0.1)
Basophils Relative: 1 %
Eosinophils Absolute: 0.1 10*3/uL (ref 0.0–0.5)
Eosinophils Relative: 4 %
HCT: 38.1 % — ABNORMAL LOW (ref 39.0–52.0)
Hemoglobin: 13.6 g/dL (ref 13.0–17.0)
Immature Granulocytes: 0 %
Lymphocytes Relative: 41 %
Lymphs Abs: 1.1 10*3/uL (ref 0.7–4.0)
MCH: 34.8 pg — ABNORMAL HIGH (ref 26.0–34.0)
MCHC: 35.7 g/dL (ref 30.0–36.0)
MCV: 97.4 fL (ref 80.0–100.0)
Monocytes Absolute: 0.3 10*3/uL (ref 0.1–1.0)
Monocytes Relative: 13 %
Neutro Abs: 1.1 10*3/uL — ABNORMAL LOW (ref 1.7–7.7)
Neutrophils Relative %: 41 %
Platelet Count: 56 10*3/uL — ABNORMAL LOW (ref 150–400)
RBC: 3.91 MIL/uL — ABNORMAL LOW (ref 4.22–5.81)
RDW: 14.5 % (ref 11.5–15.5)
WBC Count: 2.5 10*3/uL — ABNORMAL LOW (ref 4.0–10.5)
nRBC: 0 % (ref 0.0–0.2)

## 2020-06-26 LAB — CMP (CANCER CENTER ONLY)
ALT: 19 U/L (ref 0–44)
AST: 30 U/L (ref 15–41)
Albumin: 3 g/dL — ABNORMAL LOW (ref 3.5–5.0)
Alkaline Phosphatase: 84 U/L (ref 38–126)
Anion gap: 7 (ref 5–15)
BUN: 12 mg/dL (ref 8–23)
CO2: 25 mmol/L (ref 22–32)
Calcium: 9.3 mg/dL (ref 8.9–10.3)
Chloride: 111 mmol/L (ref 98–111)
Creatinine: 0.79 mg/dL (ref 0.61–1.24)
GFR, Estimated: >60 mL/min (ref >60)
Glucose, Bld: 85 mg/dL (ref 70–99)
Potassium: 4.5 mmol/L (ref 3.5–5.1)
Sodium: 143 mmol/L (ref 135–145)
Total Bilirubin: 1.5 mg/dL — ABNORMAL HIGH (ref 0.3–1.2)
Total Protein: 5.5 g/dL — ABNORMAL LOW (ref 6.5–8.1)

## 2020-06-26 LAB — LACTATE DEHYDROGENASE: LDH: 189 U/L (ref 98–192)

## 2020-06-26 LAB — SAVE SMEAR(SSMR), FOR PROVIDER SLIDE REVIEW

## 2020-06-26 NOTE — Progress Notes (Signed)
Hematology and Oncology Follow Up Visit  Steve Watson [id number] Jun 12, 1940 80 y.o. 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  We see him every 6 months.  So far, everything is going well for him.  He has some bruising.  There is no bleeding.  He has had no problems with COVID.  He still being very cautious.  He is trying to stay active.  He does stuff around the house.  He has had no change in bowel or bladder habits.  He has had no cough or shortness of breath.  There is been no issues with nausea or vomiting.  Chronic leg swelling.  Overall, his performance status is ECOG 1.    Medications:  Current Outpatient Medications:  .  aspirin 81 MG tablet, Take 81 mg by mouth daily., Disp: , Rfl:  .  calcium & magnesium carbonates (MYLANTA) 311-232 MG per tablet, Take 1 tablet by mouth daily., Disp: , Rfl:  .  Cholecalciferol 2000 UNITS TABS, Take 1 tablet (2,000 Units total) by mouth daily at 12 noon., Disp: 30 each, Rfl: 2 .  Multiple Vitamins-Minerals (VISION VITAMINS) TABS, Take 2 tablets by mouth daily., Disp: , Rfl:  .  Zinc 100 MG TABS, Take by mouth., Disp: , Rfl:  .  omeprazole (PRILOSEC) 10 MG capsule, Take 10 mg by mouth as needed.  (Patient not taking: Reported on 06/26/2020), Disp: , Rfl:   Allergies: No Known Allergies  Past Medical History, Surgical history, Social history, and Family History were reviewed and updated.  Review of Systems: Review of Systems  Constitutional: Negative.   HENT: Negative.   Eyes: Negative.   Respiratory: Negative.   Cardiovascular: Negative.   Gastrointestinal: Negative.   Genitourinary: Negative.   Musculoskeletal: Negative.   Skin: Negative.   Neurological: Negative.   Endo/Heme/Allergies: Negative.   Psychiatric/Behavioral: Negative.      Physical Exam:  weight is 84.3 kg.  His oral temperature is 98.1 F (36.7 C). His blood pressure is 151/73 (abnormal) and his pulse is 92. His respiration is 20 and oxygen saturation is 100%.   Wt Readings from Last 3 Encounters:  06/26/20 84.3 kg  06/17/20 86 kg  02/24/20 86.8 kg     Physical Exam Vitals reviewed.  HENT:     Head: Normocephalic and atraumatic.  Eyes:     Pupils: Pupils are equal, round, and reactive to light.  Cardiovascular:     Rate and Rhythm: Normal rate and regular rhythm.     Heart sounds: Normal heart sounds.  Pulmonary:     Effort: Pulmonary effort is normal.     Breath sounds: Normal breath sounds.  Abdominal:     General: Bowel sounds are normal.     Palpations: Abdomen is soft.  Musculoskeletal:        General: No tenderness or deformity. Normal range of motion.     Cervical back: Normal range of motion.  Lymphadenopathy:     Cervical: No cervical adenopathy.  Skin:    General: Skin is warm and dry.     Findings: No erythema or rash.  Neurological:     Mental Status: He is alert and oriented to person, place, and time.  Psychiatric:        Behavior: Behavior normal.        Thought Content: Thought content normal.  Judgment: Judgment normal.      Lab Results  Component Value Date   WBC 2.5 (L) 06/26/2020   HGB 13.6 06/26/2020   HCT 38.1 (L) 06/26/2020   MCV 97.4 06/26/2020   PLT 56 (L) 06/26/2020     Chemistry      Component Value Date/Time   NA 143 06/26/2020 0734   K 4.5 06/26/2020 0734   CL 111 06/26/2020 0734   CO2 25 06/26/2020 0734   BUN 12 06/26/2020 0734   CREATININE 0.79 06/26/2020 0734      Component Value Date/Time   CALCIUM 9.3 06/26/2020 0734   ALKPHOS 84 06/26/2020 0734   AST 30 06/26/2020 0734   ALT 19 06/26/2020 0734   BILITOT 1.5 (H) 06/26/2020 0734       Impression and Plan: Steve Watson is a 79 year old white male.  We have been following him now for several years.  So far, there really has not been any obvious change with his  blood counts.  In a years time, his blood count has been holding stable.  His platelet counts certainly may fluctuate.  I do not think that they will drop on a continual basis.   I looked at his blood smear under the microscope.  I do not see anything that looked unusual.  I do not see anything that looked like any kind of progression.  I suppose he may have an element of myelodysplasia.  However, I just do not feel compelled to put him through a bone marrow biopsy right now.  We will plan to get him back in 6 months.    Volanda Napoleon, MD 6/3/20228:41 AM

## 2020-07-07 ENCOUNTER — Emergency Department (HOSPITAL_BASED_OUTPATIENT_CLINIC_OR_DEPARTMENT_OTHER): Payer: Medicare Other

## 2020-07-07 ENCOUNTER — Emergency Department (HOSPITAL_COMMUNITY): Payer: Medicare Other

## 2020-07-07 ENCOUNTER — Other Ambulatory Visit: Payer: Self-pay

## 2020-07-07 ENCOUNTER — Emergency Department (HOSPITAL_BASED_OUTPATIENT_CLINIC_OR_DEPARTMENT_OTHER)
Admission: EM | Admit: 2020-07-07 | Discharge: 2020-07-07 | Disposition: A | Discharge status: Home / Self Care | Payer: Medicare Other | Attending: Emergency Medicine | Admitting: Emergency Medicine

## 2020-07-07 ENCOUNTER — Encounter (HOSPITAL_BASED_OUTPATIENT_CLINIC_OR_DEPARTMENT_OTHER): Payer: Self-pay | Admitting: Urology

## 2020-07-07 DIAGNOSIS — R7989 Other specified abnormal findings of blood chemistry: Secondary | ICD-10-CM

## 2020-07-07 DIAGNOSIS — J984 Other disorders of lung: Secondary | ICD-10-CM | POA: Diagnosis not present

## 2020-07-07 DIAGNOSIS — R7402 Elevation of levels of lactic acid dehydrogenase (LDH): Secondary | ICD-10-CM | POA: Insufficient documentation

## 2020-07-07 DIAGNOSIS — U071 COVID-19: Secondary | ICD-10-CM | POA: Insufficient documentation

## 2020-07-07 DIAGNOSIS — Z7982 Long term (current) use of aspirin: Secondary | ICD-10-CM | POA: Diagnosis not present

## 2020-07-07 DIAGNOSIS — R791 Abnormal coagulation profile: Secondary | ICD-10-CM | POA: Insufficient documentation

## 2020-07-07 DIAGNOSIS — R918 Other nonspecific abnormal finding of lung field: Secondary | ICD-10-CM | POA: Diagnosis not present

## 2020-07-07 DIAGNOSIS — R000 Tachycardia, unspecified: Secondary | ICD-10-CM | POA: Diagnosis not present

## 2020-07-07 DIAGNOSIS — R509 Fever, unspecified: Secondary | ICD-10-CM | POA: Diagnosis present

## 2020-07-07 LAB — CBC WITH DIFFERENTIAL/PLATELET
Abs Immature Granulocytes: 0.02 10*3/uL (ref 0.00–0.07)
Basophils Absolute: 0 10*3/uL (ref 0.0–0.1)
Basophils Relative: 1 %
Eosinophils Absolute: 0 10*3/uL (ref 0.0–0.5)
Eosinophils Relative: 0 %
HCT: 36.7 % — ABNORMAL LOW (ref 39.0–52.0)
Hemoglobin: 12.9 g/dL — ABNORMAL LOW (ref 13.0–17.0)
Immature Granulocytes: 1 %
Lymphocytes Relative: 7 %
Lymphs Abs: 0.3 10*3/uL — ABNORMAL LOW (ref 0.7–4.0)
MCH: 34.5 pg — ABNORMAL HIGH (ref 26.0–34.0)
MCHC: 35.1 g/dL (ref 30.0–36.0)
MCV: 98.1 fL (ref 80.0–100.0)
Monocytes Absolute: 0.5 10*3/uL (ref 0.1–1.0)
Monocytes Relative: 11 %
Neutro Abs: 3.5 10*3/uL (ref 1.7–7.7)
Neutrophils Relative %: 80 %
Platelets: 37 10*3/uL — ABNORMAL LOW (ref 150–400)
RBC: 3.74 MIL/uL — ABNORMAL LOW (ref 4.22–5.81)
RDW: 15.2 % (ref 11.5–15.5)
Smear Review: DECREASED
WBC Morphology: ABNORMAL
WBC: 4.3 10*3/uL (ref 4.0–10.5)
nRBC: 0 % (ref 0.0–0.2)

## 2020-07-07 LAB — COMPREHENSIVE METABOLIC PANEL
ALT: 34 U/L (ref 0–44)
AST: 61 U/L — ABNORMAL HIGH (ref 15–41)
Albumin: 2.5 g/dL — ABNORMAL LOW (ref 3.5–5.0)
Alkaline Phosphatase: 69 U/L (ref 38–126)
Anion gap: 8 (ref 5–15)
BUN: 19 mg/dL (ref 8–23)
CO2: 18 mmol/L — ABNORMAL LOW (ref 22–32)
Calcium: 8.2 mg/dL — ABNORMAL LOW (ref 8.9–10.3)
Chloride: 109 mmol/L (ref 98–111)
Creatinine, Ser: 0.94 mg/dL (ref 0.61–1.24)
GFR, Estimated: >60 mL/min (ref >60)
Glucose, Bld: 109 mg/dL — ABNORMAL HIGH (ref 70–99)
Potassium: 4.2 mmol/L (ref 3.5–5.1)
Sodium: 135 mmol/L (ref 135–145)
Total Bilirubin: 2.2 mg/dL — ABNORMAL HIGH (ref 0.3–1.2)
Total Protein: 4.9 g/dL — ABNORMAL LOW (ref 6.5–8.1)

## 2020-07-07 LAB — RESP PANEL BY RT-PCR (FLU A&B, COVID) ARPGX2
Influenza A by PCR: NEGATIVE
Influenza B by PCR: NEGATIVE
SARS Coronavirus 2 by RT PCR: POSITIVE — AB

## 2020-07-07 LAB — TROPONIN I (HIGH SENSITIVITY)
Troponin I (High Sensitivity): 26 ng/L — ABNORMAL HIGH (ref <18)
Troponin I (High Sensitivity): 36 ng/L — ABNORMAL HIGH (ref <18)

## 2020-07-07 LAB — LACTIC ACID, PLASMA
Lactic Acid, Venous: 3.8 mmol/L (ref 0.5–1.9)
Lactic Acid, Venous: 3 mmol/L (ref 0.5–1.9)

## 2020-07-07 LAB — PROTIME-INR
INR: 1.4 — ABNORMAL HIGH (ref 0.8–1.2)
Prothrombin Time: 16.9 seconds — ABNORMAL HIGH (ref 11.4–15.2)

## 2020-07-07 LAB — D-DIMER, QUANTITATIVE: D-Dimer, Quant: 2.31 ug/mL-FEU — ABNORMAL HIGH (ref 0.00–0.50)

## 2020-07-07 LAB — APTT: aPTT: 38 seconds — ABNORMAL HIGH (ref 24–36)

## 2020-07-07 MED ORDER — IOHEXOL 350 MG/ML SOLN
100.0000 mL | Freq: Once | INTRAVENOUS | Status: AC | PRN
  Administered 2020-07-07: 100 mL via INTRAVENOUS

## 2020-07-07 MED ORDER — SODIUM CHLORIDE 0.9 % IV SOLN
100.0000 mg | Freq: Once | INTRAVENOUS | Status: AC
  Administered 2020-07-07: 100 mg via INTRAVENOUS
  Filled 2020-07-07: qty 100

## 2020-07-07 MED ORDER — SODIUM CHLORIDE 0.9 % IV SOLN
1.0000 g | Freq: Once | INTRAVENOUS | Status: AC
  Administered 2020-07-07: 1 g via INTRAVENOUS
  Filled 2020-07-07: qty 10

## 2020-07-07 MED ORDER — IBUPROFEN 400 MG PO TABS
600.0000 mg | ORAL_TABLET | Freq: Once | ORAL | Status: AC
  Administered 2020-07-07: 600 mg via ORAL
  Filled 2020-07-07: qty 1

## 2020-07-07 MED ORDER — LACTATED RINGERS IV BOLUS (SEPSIS)
500.0000 mL | Freq: Once | INTRAVENOUS | Status: AC
Start: 2020-07-07 — End: 2020-07-07
  Administered 2020-07-07: 500 mL via INTRAVENOUS

## 2020-07-07 MED ORDER — SODIUM CHLORIDE 0.9 % IV SOLN: INTRAVENOUS | Status: DC | PRN

## 2020-07-07 MED ORDER — NIRMATRELVIR/RITONAVIR (PAXLOVID)TABLET: 3.0000 | ORAL_TABLET | Freq: Two times a day (BID) | ORAL | 0 refills | Status: AC

## 2020-07-07 NOTE — ED Triage Notes (Signed)
Sore throat that started on Saturday, wife states fatigue and weakness that started today, fever at home with flushed face here.

## 2020-07-07 NOTE — ED Notes (Signed)
Pt transported to CT, will start 2nd antibiotics upon return to room

## 2020-07-07 NOTE — ED Provider Notes (Signed)
[facility] EMERGENCY DEPARTMENT Provider Note   CSN: [id number] Arrival date & time: 07/07/20  1529     History Chief Complaint  Patient presents with   Fever   Sore Throat   Weakness    Steve Watson is a 80 y.o. male.  Steve Watson has also been quite weak.  He has had difficulty even ambulating around the house.  The history is provided by the patient and the spouse.  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    Patient Active Problem List   Diagnosis Date Noted   Leukopenia 08/17/2015   Splenomegaly, 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   Thrombocytopenia ([place]) 08/10/2015   Gastroesophageal reflux disease without esophagitis 06/03/2014    Past Surgical History:  Procedure Laterality Date   NO PAST SURGERIES         Family History  Problem Relation Age of Onset   Hypertension Mother    Hypertension Father    Hypertension Brother    Diabetes Brother    Healthy Child    Healthy Grandchild     Social History   Tobacco Use   Smoking status: Never   Smokeless tobacco: Never   Tobacco comments:    NEVER USED TOBACCO  Vaping Use   Vaping Use: Never used  Substance Use Topics   Alcohol use: No    Alcohol/week: 0.0 standard drinks   Drug use: No    Home Medications Prior to Admission medications   Medication Sig Start Date End Date Taking? Authorizing Provider  aspirin 81 MG tablet Take 81 mg by mouth  daily.    [provider]  calcium & magnesium carbonates (MYLANTA) 311-232 MG per tablet Take 1 tablet by mouth daily.    [provider]  Cholecalciferol 2000 UNITS TABS Take 1 tablet (2,000 Units total) by mouth daily at 12 noon. 06/04/14   Brunetta Jeans, PA-C  Multiple Vitamins-Minerals (VISION VITAMINS) TABS Take 2 tablets by mouth daily.    [provider]  omeprazole (PRILOSEC) 10 MG capsule Take 10 mg by mouth as needed.  Patient not taking: Reported on 06/26/2020    [provider]  Zinc 100 MG TABS Take by mouth.    [provider]    Allergies    Patient has no known allergies.  Review of Systems   Review of Systems  Constitutional:  Positive for fever. Negative for chills.  HENT:  Positive for sore throat. Negative for ear pain.   Eyes:  Negative for pain and visual disturbance.  Respiratory:  Positive for cough. Negative for shortness of breath and wheezing.   Cardiovascular:  Negative for chest pain and palpitations.  Gastrointestinal:  Negative for abdominal pain and vomiting.  Genitourinary:  Negative for dysuria and hematuria.  Musculoskeletal:  Negative for arthralgias and back pain.  Skin:  Negative for color change and rash.  Neurological:  Positive for weakness. Negative for seizures and syncope.  All other systems reviewed and are negative.  Physical Exam Updated Vital Signs BP (!) 144/79 (BP Location: Right Arm)   Pulse (!) 113   Temp (!) 100.8 F (38.2 C) (Oral)   Resp 18   Ht 5' 8"  (1.727 m)   Wt 84.3 kg   SpO2 94%   BMI 28.25 kg/m   Physical Exam Vitals and nursing note reviewed.  Constitutional:      Appearance: He is well-developed.  HENT:     Head: Normocephalic and atraumatic.  Eyes:     Conjunctiva/sclera: Conjunctivae normal.  Cardiovascular:     Rate and Rhythm: Regular rhythm. Tachycardia present.     Heart sounds: No murmur heard. Pulmonary:     Effort: Pulmonary effort is normal. No  respiratory distress.     Breath sounds: Rhonchi present.     Comments: Mild, diffuse rhonchi Musculoskeletal:     Cervical back: Neck supple.  Skin:    General: Skin is warm and dry.  Neurological:     General: No focal deficit present.     Mental Status: He is alert and oriented to person, place, and time.  Psychiatric:        Behavior: Behavior normal.    ED Results / Procedures / Treatments   Labs (all labs ordered are listed, but only abnormal results are displayed) Labs Reviewed  RESP PANEL BY RT-PCR (FLU A&B, COVID) ARPGX2 - Abnormal; Notable for the following components:      Result Value   SARS Coronavirus 2 by RT PCR POSITIVE (*)    All other components within normal limits  LACTIC ACID, PLASMA - Abnormal; Notable for the following components:   Lactic Acid, Venous 3.8 (*)    All other components within normal limits  LACTIC ACID, PLASMA - Abnormal; Notable for the following components:   Lactic Acid, Venous 3.0 (*)    All other components within normal limits  COMPREHENSIVE METABOLIC PANEL - Abnormal; Notable for the following components:   CO2 18 (*)    Glucose, Bld 109 (*)    Calcium 8.2 (*)    Total Protein 4.9 (*)    Albumin 2.5 (*)    AST 61 (*)    Total Bilirubin 2.2 (*)    All other components within normal limits  CBC WITH DIFFERENTIAL/PLATELET - Abnormal; Notable for the following components:   RBC 3.74 (*)    Hemoglobin 12.9 (*)    HCT 36.7 (*)    MCH 34.5 (*)    Platelets 37 (*)    Lymphs Abs 0.3 (*)    All other components within normal limits  PROTIME-INR - Abnormal; Notable for the following components:   Prothrombin Time 16.9 (*)    INR 1.4 (*)    All other components within normal limits  APTT - Abnormal; Notable for the following components:   aPTT 38 (*)    All other components within normal limits  D-DIMER, QUANTITATIVE - Abnormal; Notable for the following components:   D-Dimer, Quant 2.31 (*)    All other components within normal  limits  TROPONIN I (HIGH SENSITIVITY) - Abnormal; Notable for the following components:   Troponin I (High Sensitivity) 26 (*)    All other components within normal limits  TROPONIN I (HIGH SENSITIVITY) - Abnormal; Notable for the following components:   Troponin I (High Sensitivity) 36 (*)    All other components within normal limits  CULTURE, BLOOD (SINGLE)  URINE CULTURE  URINALYSIS, ROUTINE W REFLEX MICROSCOPIC  EKG EKG Interpretation  Date/Time:  Tuesday July 07 2020 15:37:06 EDT Ventricular Rate:  115 PR Interval:  156 QRS Duration: 117 QT Interval:  344 QTC Calculation: 476 R Axis:   92 Text Interpretation: Sinus tachycardia Nonspecific intraventricular conduction delay normal axis No acute ischemia Confirmed by Lorre Munroe ([id number]) on 07/07/2020 4:03:52 PM  Radiology CT Angio Chest PE W and/or Wo Contrast  Result Date: 07/07/2020 CLINICAL DATA:  Positive D-dimer. Pulmonary embolus is suspected with low to intermediate probability. EXAM: CT ANGIOGRAPHY CHEST WITH CONTRAST TECHNIQUE: Multidetector CT imaging of the chest was performed using the standard protocol during bolus administration of intravenous contrast. Multiplanar CT image reconstructions and MIPs were obtained to evaluate the vascular anatomy. CONTRAST:  137m OMNIPAQUE IOHEXOL 350 MG/ML SOLN COMPARISON:  Chest radiograph 07/07/2020 FINDINGS: Cardiovascular: Good opacification of the central and segmental pulmonary arteries. No focal filling defects. No evidence of significant pulmonary embolus. Normal heart size. No pericardial effusions. Coronary artery and aortic calcifications. No aortic aneurysm. No aortic dissection. Mediastinum/Nodes: Scattered mediastinal lymph nodes are not abnormally enlarged. Esophagus is decompressed. Thyroid gland is unremarkable. Lungs/Pleura: Patchy infiltrates and areas consolidation demonstrated in the right middle lung and both lower lungs changes are most likely represent multifocal  pneumonia. No pleural effusions. No pneumothorax. Upper Abdomen: Cirrhotic changes in the liver with splenic enlargement and upper abdominal varices. Musculoskeletal: Degenerative changes in the spine. No destructive bone lesions. Review of the MIP images confirms the above findings. IMPRESSION: 1. No evidence of significant pulmonary embolus. 2. Patchy infiltrates in both lungs likely representing multifocal pneumonia. 3. 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 Electronically Signed   By: WLucienne CapersM.D.   On: 07/07/2020 18:44   DG Chest Port 1 View  Result Date: 07/07/2020 CLINICAL DATA:  Possible sepsis EXAM: PORTABLE CHEST 1 VIEW COMPARISON:  None. FINDINGS: Cardiac shadow is at the upper limits of normal in size. The lungs are well aerated bilaterally. Increased density is noted in the medial right lung base consistent with early infiltrate. No other focal abnormality is noted. IMPRESSION: Early infiltrate in the right lung base. Electronically Signed   By: MInez CatalinaM.D.   On: 07/07/2020 16:55    Procedures .Critical Care  Date/Time: 07/07/2020 7:33 PM Performed by: WArnaldo Natal MD Authorized by: WArnaldo Natal MD   Critical care provider statement:    Critical care time (minutes):  30   Critical care time was exclusive of:  Separately billable procedures and treating other patients and teaching time   Critical care was necessary to treat or prevent imminent or life-threatening deterioration of the following conditions:  Sepsis   Critical care was time spent personally by me on the following activities:  Blood draw for specimens, development of treatment plan with patient or surrogate, evaluation of patient's response to treatment, examination of patient, obtaining history from patient or surrogate, pulse oximetry, ordering and review of radiographic studies, ordering and review of laboratory studies, ordering and performing treatments and  interventions, review of old charts and re-evaluation of patient's condition   Medications Ordered in ED Medications  lactated ringers bolus 500 mL (has no administration in time range)  ibuprofen (ADVIL) tablet 600 mg (has no administration in time range)    ED Course  I have reviewed the triage vital signs and the nursing notes.  Pertinent labs & imaging results that were available during my care of the patient were reviewed by me and considered in my medical decision  making (see chart for details).    MDM Rules/Calculators/A&P                          Sherald Barge presented with viral symptoms.  He was found to be positive for COVID-19.  Vital signs had normalized significantly at discharge with heart rate lower and temperature lower.  Labs revealed he is likely experiencing a severe case of COVID-19 and would be at risk of decompensation.  His age and male sex also.  Raise his risk for severe illness.  He does not strictly meet admission criteria, and he would like to go home.  I have asked him to start Paxlovid immediately.  He was given very careful return precautions.  While he was here, I did treat him initially according to the sepsis algorithm.  He did receive antibiotics prior to his COVID-19 test returning.  However, because he has a viral etiology of symptoms, he does not meet sepsis criteria. Final Clinical Impression(s) / ED Diagnoses Final diagnoses:  COVID-19  Elevated lactic acid level    Rx / DC Orders ED Discharge Orders          Ordered    nirmatrelvir/ritonavir EUA (PAXLOVID) TABS  2 times daily        07/07/20 1930             Arnaldo Natal, MD 07/07/20 1934

## 2020-07-12 LAB — CULTURE, BLOOD (SINGLE): Culture: NO GROWTH

## 2020-07-13 ENCOUNTER — Ambulatory Visit (HOSPITAL_BASED_OUTPATIENT_CLINIC_OR_DEPARTMENT_OTHER)
Admission: RE | Admit: 2020-07-13 | Discharge: 2020-07-13 | Disposition: A | Discharge status: Home / Self Care | Payer: Medicare Other | Source: Ambulatory Visit | Attending: Medical | Admitting: Medical

## 2020-07-13 ENCOUNTER — Ambulatory Visit (INDEPENDENT_AMBULATORY_CARE_PROVIDER_SITE_OTHER): Payer: Medicare Other | Admitting: Medical

## 2020-07-13 ENCOUNTER — Other Ambulatory Visit: Payer: Self-pay

## 2020-07-13 VITALS — BP 130/70 | HR 100 | Resp 20 | Ht 68.0 in | Wt 188.4 lb

## 2020-07-13 DIAGNOSIS — J189 Pneumonia, unspecified organism: Secondary | ICD-10-CM | POA: Diagnosis not present

## 2020-07-13 DIAGNOSIS — D7381 Neutropenic splenomegaly: Secondary | ICD-10-CM

## 2020-07-13 DIAGNOSIS — D700 Congenital agranulocytosis: Secondary | ICD-10-CM

## 2020-07-13 DIAGNOSIS — D696 Thrombocytopenia, unspecified: Secondary | ICD-10-CM

## 2020-07-13 DIAGNOSIS — R9389 Abnormal findings on diagnostic imaging of other specified body structures: Secondary | ICD-10-CM | POA: Diagnosis not present

## 2020-07-13 DIAGNOSIS — U071 COVID-19: Secondary | ICD-10-CM

## 2020-07-13 DIAGNOSIS — R5383 Other fatigue: Secondary | ICD-10-CM

## 2020-07-13 MED ORDER — AZITHROMYCIN 250 MG PO TABS: ORAL_TABLET | ORAL | 0 refills | Status: AC

## 2020-07-13 NOTE — Patient Instructions (Addendum)
6 days post covid + test. 02 sat 97%  Today with  no signs or symptoms except fatigue.  Xray showed in ED showed early infiltrate and ct indicated this was the case as well.  Took paxlovid and improved.  Will get cbc, lactic acid and repeat cxr. Lactic acid was elevated but not admitted since did not meet criteria for sepsis on review per ED note.  Overall looks stable.   Follow up 2 weeks or as needed.  If signs or symptoms worsen or change notify us. Rare rebound after paxlovid can happen.

## 2020-07-13 NOTE — Progress Notes (Signed)
   Subjective:    Patient ID: Kade Demicco, male    DOB: 09-Apr-1940, 80 y.o.   MRN: [id number]  HPI  Pt in for fatigue.   Pt was diagnosed with covid 6 days ago. Pt was vaccinated 2 time but no booster. Nov 17, 2019 had 2nd vaccine.  No sob, no wheezing or productive cough  Pt was evaluated in the ED and below is last A/P   "Sherald Barge presented with viral symptoms.  He was found to be positive for COVID-19.  Vital signs had normalized significantly at discharge with heart rate lower and temperature lower.  Labs revealed he is likely experiencing a severe case of COVID-19 and would be at risk of decompensation.  His age and male sex also.  Raise his risk for severe illness.  He does not strictly meet admission criteria, and he would like to go home.  I have asked him to start Paxlovid immediately.  He was given very careful return precautions.  While he was here, I did treat him initially according to the sepsis algorithm.  He did receive antibiotics prior to his COVID-19 test returning.  However, because he has a viral etiology of symptoms, he does not meet sepsis criteria."   Pt did take the paxlovid the emergency dept rx'd.   Cxr showed early infection/infiltrate rt lung base. ED note says he got antibiotic in ED.  D dimer elevated.   Ct showed.  IMPRESSION: 1. No evidence of significant pulmonary embolus. 2. Patchy infiltrates in both lungs likely representing multifocal pneumonia. 3. 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         Review of Systems  Constitutional:  Positive for fatigue. Negative for chills and fever.  HENT:  Negative for congestion, ear discharge and ear pain.   Respiratory:  Negative for cough, chest tightness, shortness of breath and wheezing.   Gastrointestinal:  Negative for abdominal pain, blood in stool, diarrhea, nausea and vomiting.  Genitourinary:  Negative for dysuria and flank pain.   Musculoskeletal:  Negative for back pain.  Neurological:  Negative for dizziness, syncope, numbness and headaches.  Psychiatric/Behavioral:  Negative for behavioral problems and decreased concentration.       Objective:   Physical Exam  General- No acute distress. Pleasant patient. Neck- Full range of motion, no jvd Lungs- Clear, even and unlabored. Heart- regular rate and rhythm. Neurologic- CNII- XII grossly intact.   Lower ext- no pedal edema.     Assessment & Plan:  6 days post covid + test. 02 sat 97% with signs or symptoms except fatigue.  Xray showed early infilatrate and ct indicated this was the case as well.  Took paxlovid and improved.  Will get cbc, lactic acid and repeat cxr. Lactic acid was elevated but not admitted since did not meet criteria for sepsis on review per ED note.  Overall looks stable.   Follow up 2 weeks or as needed.  If signs or symptoms worsen or change notify us. Rare rebound after paxlovid can happen.   Mackie Pai, PA-C

## 2020-07-13 NOTE — Progress Notes (Signed)
Labs needed to be reordered

## 2020-07-13 NOTE — Addendum Note (Signed)
Addended by: Manuela Schwartz on: 07/13/2020 02:39 PM   Modules accepted: Orders

## 2020-07-13 NOTE — Addendum Note (Signed)
Addended by: Manuela Schwartz on: 07/13/2020 12:16 PM   Modules accepted: Orders

## 2020-07-13 NOTE — Addendum Note (Signed)
Addended by: Anabel Halon on: 07/13/2020 06:10 PM   Modules accepted: Orders

## 2020-07-14 LAB — CBC WITH DIFFERENTIAL/PLATELET
Basophils Absolute: 0 10*3/uL (ref 0.0–0.2)
Basos: 1 %
EOS (ABSOLUTE): 0 10*3/uL (ref 0.0–0.4)
Eos: 0 %
Hematocrit: 38.2 % (ref 37.5–51.0)
Hemoglobin: 13.8 g/dL (ref 13.0–17.7)
Immature Grans (Abs): 0.1 10*3/uL (ref 0.0–0.1)
Immature Granulocytes: 2 %
Lymphocytes Absolute: 0.7 10*3/uL (ref 0.7–3.1)
Lymphs: 12 %
MCH: 33.7 pg — ABNORMAL HIGH (ref 26.6–33.0)
MCHC: 36.1 g/dL — ABNORMAL HIGH (ref 31.5–35.7)
MCV: 93 fL (ref 79–97)
Monocytes Absolute: 0.6 10*3/uL (ref 0.1–0.9)
Monocytes: 10 %
Neutrophils Absolute: 4.5 10*3/uL (ref 1.4–7.0)
Neutrophils: 75 %
Platelets: 88 10*3/uL — CL (ref 150–450)
RBC: 4.09 x10E6/uL — ABNORMAL LOW (ref 4.14–5.80)
RDW: 13.1 % (ref 11.6–15.4)
WBC: 5.9 10*3/uL (ref 3.4–10.8)

## 2020-07-14 LAB — COMPREHENSIVE METABOLIC PANEL
ALT: 29 IU/L (ref 0–44)
AST: 58 IU/L — ABNORMAL HIGH (ref 0–40)
Albumin/Globulin Ratio: 1 — ABNORMAL LOW (ref 1.2–2.2)
Albumin: 2.7 g/dL — ABNORMAL LOW (ref 3.7–4.7)
Alkaline Phosphatase: 98 IU/L (ref 44–121)
BUN/Creatinine Ratio: 21 (ref 10–24)
BUN: 18 mg/dL (ref 8–27)
Bilirubin Total: 2.1 mg/dL — ABNORMAL HIGH (ref 0.0–1.2)
CO2: 21 mmol/L (ref 20–29)
Calcium: 8.7 mg/dL (ref 8.6–10.2)
Chloride: 105 mmol/L (ref 96–106)
Creatinine, Ser: 0.85 mg/dL (ref 0.76–1.27)
Globulin, Total: 2.6 g/dL (ref 1.5–4.5)
Glucose: 91 mg/dL (ref 65–99)
Potassium: 4.3 mmol/L (ref 3.5–5.2)
Sodium: 138 mmol/L (ref 134–144)
Total Protein: 5.3 g/dL — ABNORMAL LOW (ref 6.0–8.5)
eGFR: 89 mL/min/{1.73_m2} (ref >59)

## 2020-07-14 LAB — LACTIC ACID, PLASMA: Lactate, Ven: 27.3 mg/dL — ABNORMAL HIGH (ref 4.8–25.7)

## 2020-07-17 ENCOUNTER — Ambulatory Visit (INDEPENDENT_AMBULATORY_CARE_PROVIDER_SITE_OTHER): Payer: Medicare Other | Admitting: Medical

## 2020-07-17 ENCOUNTER — Other Ambulatory Visit: Payer: Self-pay

## 2020-07-17 VITALS — BP 133/78 | HR 100 | Resp 18 | Ht 68.0 in | Wt 187.0 lb

## 2020-07-17 DIAGNOSIS — J189 Pneumonia, unspecified organism: Secondary | ICD-10-CM

## 2020-07-17 NOTE — Patient Instructions (Addendum)
Recent covid infection with resolution of all signs and symptoms. Fatigue was the only lingering symptom. Cxr showed pneumonia and fatigue  resolved with azithromycin. Clinically much improved. Continue azithromycin until finished with treatment.   Your lactic acid was elevated but all symptoms resolve. I don't think any lab work needed [place].  Will put in future chest xray future and future lab. Get scheduled for lab work for July 28, 2020.(Cbc and lactic acid)   During interim before 07-28-2020 if signs/symptoms return or worsen be seen here, other office, UC or ED.

## 2020-07-17 NOTE — Progress Notes (Signed)
Subjective:    Patient ID: Steve Watson, male    DOB: 19-May-1940, 80 y.o.   MRN: [id number]  HPI  Pt in for follow up.  He tells me he is whole lot better than before. His only symptoms last time I saw him was fatigue. He had covid infection in the past. His chest xray showed below. I had given him azithromycin and he states fatigue resolved and he feels like he can work out at [employer]. Had wanted him to come in for visit as his lactic acid was mild elevated. It was mild elevated in the ED as well. In ED they decided not to admit him   "He  has a viral etiology of symptoms, he does not meet sepsis criteria."   IMPRESSION: Compared to the plain film of 6 days ago, relatively similar appearance of multifocal airspace opacities, consistent with pneumonia. When correlated with the recent CT, findings could either represent an atypical appearance of COVID-19 pneumonia or superimposed bacterial infection.    Review of Systems  Constitutional:  Negative for chills, fatigue and fever.  HENT:  Negative for congestion and ear discharge.   Respiratory:  Negative for cough, choking, chest tightness and wheezing.   Cardiovascular:  Negative for chest pain and palpitations.  Gastrointestinal:  Negative for abdominal pain, diarrhea and vomiting.  Genitourinary:  Negative for difficulty urinating, flank pain and frequency.  Musculoskeletal:  Negative for arthralgias, gait problem and myalgias.  Skin:  Negative for rash.    Past Medical History:  Diagnosis Date  . Chicken pox   . GERD (gastroesophageal reflux disease)   . 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 Marital status: Married    Spouse name: Not on file  . Number of children: Not on file  . Years of education: Not on file  . Highest education level: Not on file  Occupational History  . Not on file  Tobacco Use  . Smoking status: Never  .  Smokeless tobacco: Never  . Tobacco comments:    NEVER USED TOBACCO  Vaping Use  . Vaping Use: Never used  Substance and Sexual Activity  . Alcohol use: No    Alcohol/week: 0.0 standard drinks  . Drug use: No  . Sexual activity: Yes  Other Topics Concern  . Not on file  Social History Narrative   2 Sons; grandson works in Recruitment consultant in [employer] for 8 years    Enjoys weightlifting    Social Determinants of Radio broadcast assistant Strain: [place]   . Difficulty of Paying Living Expenses: Not hard at all  Food Insecurity: No Food Insecurity  . Worried About Charity fundraiser in the Last Year: Never true  . Ran Out of Food in the Last Year: Never true  Transportation Needs: No Transportation Needs  . Lack of Transportation (Medical): No  . Lack of Transportation (Non-Medical): No  Physical Activity: Sufficiently Active  . Days of Exercise per Week: 3 days  . Minutes of Exercise per Session: 50 min  Stress: No Stress Concern Present  . Feeling of Stress : Not at all  Social Connections: Moderately Isolated  . Frequency of Communication with Friends and Family: More than three times a week  . Frequency of Social Gatherings with Friends and Family: More than three times a week  . Attends Religious Services: Never  . Active Member of Clubs  or Organizations: No  . Attends Archivist Meetings: Never  . Marital Status: Married  Human resources officer Violence: Not At Risk  . Fear of Current or Ex-Partner: No  . Emotionally Abused: No  . Physically Abused: No  . Sexually Abused: No    Past Surgical History:  Procedure Laterality Date  . NO PAST SURGERIES      Family History  Problem Relation Age of Onset  . Hypertension Mother   . Hypertension Father   . Hypertension Brother   . Diabetes Brother   . Healthy Child   . Healthy Grandchild     No Known Allergies  Current Outpatient Medications on File Prior to Visit  Medication Sig Dispense Refill   . aspirin 81 MG tablet Take 81 mg by mouth daily.    Marland Kitchen azithromycin (ZITHROMAX) 250 MG tablet Take 2 tablets on day 1, then 1 tablet daily on days 2 through 5 6 tablet 0  . calcium & magnesium carbonates (MYLANTA) 564-332 MG per tablet Take 1 tablet by mouth daily.    . Cholecalciferol 2000 UNITS TABS Take 1 tablet (2,000 Units total) by mouth daily at 12 noon. 30 each 2  . Multiple Vitamins-Minerals (VISION VITAMINS) TABS Take 2 tablets by mouth daily.    Marland Kitchen omeprazole (PRILOSEC) 10 MG capsule Take 10 mg by mouth as needed.    . Zinc 100 MG TABS Take by mouth.     No current facility-administered medications on file prior to visit.    BP 133/78   Pulse 100   Resp 18   Ht 5' 8"  (1.727 m)   Wt 187 lb (84.8 kg)   SpO2 99%   BMI 28.43 kg/m       Objective:   Physical Exam  General Mental Status- Alert. General Appearance- Not in acute distress.   Skin General: Color- Normal Color. Moisture- Normal Moisture.  Neck Carotid Arteries- Normal color. Moisture- Normal Moisture. No carotid bruits. No JVD.  Chest and Lung Exam Auscultation: Breath Sounds:-Normal.  Cardiovascular Auscultation:Rythm- Regular. Murmurs & Other Heart Sounds:Auscultation of the heart reveals- No Murmurs.  Abdomen Inspection:-Inspeection Normal. Palpation/Percussion:Note:No mass. Palpation and Percussion of the abdomen reveal- Non Tender, Non Distended + BS, no rebound or guarding.   Neurologic Cranial Nerve exam:- CN III-XII intact(No nystagmus), symmetric smile. Strength:- 5/5 equal and symmetric strength both upper and lower extremities.       Assessment & Plan:  Recent covid infection with resolution of all signs and symptoms. Fatigue was the only lingering symptom. Cxr showed pneumonia and fatigue  resolved with azithromycin. Clinically much improved. Continue azithromycin until finished with treatment.   Your lactic acid was elevated but all symptoms resolve. I don't think any lab work  needed [place].  Will put in future chest xray future and future lab. Get scheduled for lab work for July 28, 2020.(Cbc and lactic acid)   During interim before 07-28-2020 if signs/symptoms return or worsen be seen here, other office, UC or ED.  Mackie Pai, PA-C

## 2020-07-28 ENCOUNTER — Other Ambulatory Visit: Payer: Self-pay

## 2020-07-28 ENCOUNTER — Other Ambulatory Visit (INDEPENDENT_AMBULATORY_CARE_PROVIDER_SITE_OTHER): Payer: Medicare Other

## 2020-07-28 DIAGNOSIS — J189 Pneumonia, unspecified organism: Secondary | ICD-10-CM | POA: Diagnosis not present

## 2020-07-28 LAB — CBC WITH DIFFERENTIAL/PLATELET
Basophils Absolute: 0 10*3/uL (ref 0.0–0.1)
Basophils Relative: 0.9 % (ref 0.0–3.0)
Eosinophils Absolute: 0 10*3/uL (ref 0.0–0.7)
Eosinophils Relative: 1.4 % (ref 0.0–5.0)
HCT: 35.9 % — ABNORMAL LOW (ref 39.0–52.0)
Hemoglobin: 12.5 g/dL — ABNORMAL LOW (ref 13.0–17.0)
Lymphocytes Relative: 27.8 % (ref 12.0–46.0)
Lymphs Abs: 0.8 10*3/uL (ref 0.7–4.0)
MCHC: 34.8 g/dL (ref 30.0–36.0)
MCV: 100.3 fl — ABNORMAL HIGH (ref 78.0–100.0)
Monocytes Absolute: 0.4 10*3/uL (ref 0.1–1.0)
Monocytes Relative: 13.1 % — ABNORMAL HIGH (ref 3.0–12.0)
Neutro Abs: 1.7 10*3/uL (ref 1.4–7.7)
Neutrophils Relative %: 56.8 % (ref 43.0–77.0)
Platelets: 65 10*3/uL — ABNORMAL LOW (ref 150.0–400.0)
RBC: 3.58 Mil/uL — ABNORMAL LOW (ref 4.22–5.81)
RDW: 16.1 % — ABNORMAL HIGH (ref 11.5–15.5)
WBC: 3 10*3/uL — ABNORMAL LOW (ref 4.0–10.5)

## 2020-07-30 LAB — LACTIC ACID, PLASMA: LACTIC ACID: 2.2 mmol/L — ABNORMAL HIGH (ref 0.4–1.8)

## 2020-08-19 ENCOUNTER — Ambulatory Visit (HOSPITAL_BASED_OUTPATIENT_CLINIC_OR_DEPARTMENT_OTHER)
Admission: RE | Admit: 2020-08-19 | Discharge: 2020-08-19 | Disposition: A | Discharge status: Home / Self Care | Payer: Medicare Other | Source: Ambulatory Visit | Attending: Medical | Admitting: Medical

## 2020-08-19 ENCOUNTER — Other Ambulatory Visit: Payer: Self-pay

## 2020-08-19 ENCOUNTER — Ambulatory Visit (INDEPENDENT_AMBULATORY_CARE_PROVIDER_SITE_OTHER): Payer: Medicare Other | Admitting: Medical

## 2020-08-19 VITALS — BP 140/78 | HR 100 | Resp 16 | Ht 68.0 in | Wt 195.0 lb

## 2020-08-19 DIAGNOSIS — J984 Other disorders of lung: Secondary | ICD-10-CM | POA: Diagnosis not present

## 2020-08-19 DIAGNOSIS — R600 Localized edema: Secondary | ICD-10-CM | POA: Diagnosis not present

## 2020-08-19 LAB — COMPREHENSIVE METABOLIC PANEL
ALT: 19 U/L (ref 0–53)
AST: 33 U/L (ref 0–37)
Albumin: 2.7 g/dL — ABNORMAL LOW (ref 3.5–5.2)
Alkaline Phosphatase: 96 U/L (ref 39–117)
BUN: 7 mg/dL (ref 6–23)
CO2: 22 mEq/L (ref 19–32)
Calcium: 8 mg/dL — ABNORMAL LOW (ref 8.4–10.5)
Chloride: 115 mEq/L — ABNORMAL HIGH (ref 96–112)
Creatinine, Ser: 0.66 mg/dL (ref 0.40–1.50)
GFR: 89.91 mL/min (ref >60.00)
Glucose, Bld: 80 mg/dL (ref 70–99)
Potassium: 3.7 mEq/L (ref 3.5–5.1)
Sodium: 143 mEq/L (ref 135–145)
Total Bilirubin: 1.7 mg/dL — ABNORMAL HIGH (ref 0.2–1.2)
Total Protein: 5.6 g/dL — ABNORMAL LOW (ref 6.0–8.3)

## 2020-08-19 LAB — BRAIN NATRIURETIC PEPTIDE: Pro B Natriuretic peptide (BNP): 82 pg/mL (ref 0.0–100.0)

## 2020-08-19 NOTE — Progress Notes (Signed)
Subjective:    Patient ID: Steve Watson, male    DOB: 06-02-1940, 80 y.o.   MRN: [id number]  HPI  Pt in for bilateral lower ext swelling for about 2 weeks. Pt got some otc diuretic but did not help. Pt denies any sob/dyspnea.   No orthopnea/sleeping well. 02 sat is normal. Pt has tried to elevate his legs and it did not help.  Pt states in past had this 2 years ago. Took meds and swelling resolved.    Review of Systems  Constitutional:  Negative for chills, fatigue and fever.  HENT:  Negative for congestion.   Respiratory:  Negative for cough, chest tightness, shortness of breath and wheezing.   Cardiovascular:  Negative for chest pain and palpitations.  Gastrointestinal:  Negative for abdominal pain, constipation and diarrhea.  Musculoskeletal:  Negative for back pain, myalgias, neck pain and neck stiffness.  Skin:  Negative for rash.  Hematological:  Negative for adenopathy. Does not bruise/bleed easily.  Psychiatric/Behavioral:  Negative for behavioral problems and decreased concentration.     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     Social History   Socioeconomic History   Marital status: Married    Spouse name: Not on file   Number of children: Not on file   Years of education: Not on file   Highest education level: Not on file  Occupational History   Not on file  Tobacco Use   Smoking status: Never   Smokeless tobacco: Never   Tobacco comments:    NEVER USED TOBACCO  Vaping Use   Vaping Use: Never used  Substance and Sexual Activity   Alcohol use: No    Alcohol/week: 0.0 standard drinks   Drug use: No   Sexual activity: Yes  Other Topics Concern   Not on file  Social History Narrative   2 Sons; grandson works in Recruitment consultant in [employer] for 8 years    Enjoys weightlifting    Social Determinants of Systems developer Strain: Low Risk    Difficulty of Paying Living Expenses: Not hard at all  Food Insecurity: No Food Insecurity   Worried About Charity fundraiser in the Last Year: Never true   Arboriculturist in the Last Year: Never true  Transportation Needs: No Transportation Needs   Lack of Transportation (Medical): No   Lack of Transportation (Non-Medical): No  Physical Activity: Sufficiently Active   Days of Exercise per Week: 3 days   Minutes of Exercise per Session: 50 min  Stress: No Stress Concern Present   Feeling of Stress : Not at all  Social Connections: Moderately Isolated   Frequency of Communication with Friends and Family: More than three times a week   Frequency of Social Gatherings with Friends and Family: More than three times a week   Attends Religious Services: Never   Marine scientist or Organizations: No   Attends Music therapist: Never   Marital Status: Married  Human resources officer Violence: Not At Risk   Fear of Current or Ex-Partner: No   Emotionally Abused: No   Physically Abused: No   Sexually Abused: No    Past Surgical History:  Procedure Laterality Date   NO PAST SURGERIES      Family History  Problem Relation Age of Onset   Hypertension  Mother    Hypertension Father    Hypertension Brother    Diabetes Brother    Healthy Child    Healthy Grandchild     No Known Allergies  Current Outpatient Medications on File Prior to Visit  Medication Sig Dispense Refill   aspirin 81 MG tablet Take 81 mg by mouth daily.     calcium & magnesium carbonates (MYLANTA) 311-232 MG per tablet Take 1 tablet by mouth daily.     Cholecalciferol 2000 UNITS TABS Take 1 tablet (2,000 Units total) by mouth daily at 12 noon. 30 each 2   Multiple Vitamins-Minerals (VISION VITAMINS) TABS Take 2 tablets by mouth daily.     omeprazole (PRILOSEC) 10 MG capsule Take 10 mg by mouth as needed.     Zinc 100 MG TABS Take by mouth.     No current  facility-administered medications on file prior to visit.    BP 140/78   Pulse 100   Resp 16   Ht 5' 8"  (1.727 m)   Wt 195 lb (88.5 kg)   SpO2 97%   BMI 29.65 kg/m       Objective:   Physical Exam  General Mental Status- Alert. General Appearance- Not in acute distress.   Skin General: Color- Normal Color. Moisture- Normal Moisture.  Neck Carotid Arteries- Normal color. Moisture- Normal Moisture. No carotid bruits. No JVD.  Chest and Lung Exam Auscultation: Breath Sounds:-Normal.  Cardiovascular Auscultation:Rythm- Regular. Murmurs & Other Heart Sounds:Auscultation of the heart reveals- No Murmurs.  Abdomen Inspection:-Inspeection Normal. Palpation/Percussion:Note:No mass. Palpation and Percussion of the abdomen reveal- Non Tender, Non Distended + BS, no rebound or guarding.    Neurologic Cranial Nerve exam:- CN III-XII intact(No nystagmus), symmetric smile. Strength:- 5/5 equal and symmetric strength both upper and lower extremities.   Lower ext- bilateral pedal edema. 2-3 + symmetric work.  Negative homans signs.    Assessment & Plan:   You do have moderate to severe bilateral pedal edema. Leaning toward dependant edema. Will get labs to evaluate for chf or DVT due to severity.  If no chf or dvt, elevate legs, use compression stocking and decide on dosing of possible diuretic.  BP higher than usual compared to prior readings.  Follow up in 2 weeks or sooner if needed. Will give more exact time on follow up after lab/imaging review.   Mackie Pai, PA-C

## 2020-08-19 NOTE — Patient Instructions (Addendum)
You do have moderate to severe bilateral pedal edema. Leaning toward dependant edema. Will get labs to evaluate for chf or DVT due to severity.  If no chf or dvt, elevate legs, use compression stocking and decide on dosing of possible diuretic.  BP higher/ 140/85 range. Higher than usual compared to prior readings. Diuretic can reduce bp as well.  Follow up in 2 weeks or sooner if needed. Will give more exact time on follow up after lab/imaging review.

## 2020-08-20 MED ORDER — FUROSEMIDE 20 MG PO TABS: 20.0000 mg | ORAL_TABLET | Freq: Every day | ORAL | 0 refills | Status: DC

## 2020-08-20 NOTE — Addendum Note (Signed)
Addended by: Anabel Halon on: 08/20/2020 05:30 AM   Modules accepted: Orders

## 2020-08-28 ENCOUNTER — Ambulatory Visit (INDEPENDENT_AMBULATORY_CARE_PROVIDER_SITE_OTHER): Payer: Medicare Other | Admitting: Medical

## 2020-08-28 ENCOUNTER — Other Ambulatory Visit: Payer: Self-pay

## 2020-08-28 VITALS — BP 144/85 | HR 100 | Resp 18 | Ht 68.0 in | Wt 184.0 lb

## 2020-08-28 DIAGNOSIS — R600 Localized edema: Secondary | ICD-10-CM

## 2020-08-28 LAB — COMPREHENSIVE METABOLIC PANEL
ALT: 18 U/L (ref 0–53)
AST: 30 U/L (ref 0–37)
Albumin: 2.8 g/dL — ABNORMAL LOW (ref 3.5–5.2)
Alkaline Phosphatase: 91 U/L (ref 39–117)
BUN: 10 mg/dL (ref 6–23)
CO2: 26 mEq/L (ref 19–32)
Calcium: 8.5 mg/dL (ref 8.4–10.5)
Chloride: 111 mEq/L (ref 96–112)
Creatinine, Ser: 0.67 mg/dL (ref 0.40–1.50)
GFR: 89.48 mL/min (ref >60.00)
Glucose, Bld: 146 mg/dL — ABNORMAL HIGH (ref 70–99)
Potassium: 4.1 mEq/L (ref 3.5–5.1)
Sodium: 141 mEq/L (ref 135–145)
Total Bilirubin: 2 mg/dL — ABNORMAL HIGH (ref 0.2–1.2)
Total Protein: 5.5 g/dL — ABNORMAL LOW (ref 6.0–8.3)

## 2020-08-28 NOTE — Progress Notes (Signed)
Subjective:    Patient ID: Steve Watson, male    DOB: [birth year], 80 y.o.   MRN: [id number]  HPI  Pt in for follow for pedal edema. See last note.   US showed no dvt. Normal bnp. Normal potassium. Chest xray showed not chf.   Pt had not fever or cough. Xray report mentioned artifact on xray.  Appears not pneumonia.  I had advised compression socks, elevate legs and 7 tabs lasix.   Pt states he lost about 9 lbs in past week. Pt thinks he is adequatley hydrated. Bp 140/60 today.  Pt has ran out of lasix this Wednesday.   Review of Systems  Constitutional:  Negative for chills, fatigue and fever.  Respiratory:  Negative for cough, chest tightness, shortness of breath and wheezing.   Cardiovascular:  Negative for chest pain and palpitations.  Gastrointestinal:  Negative for abdominal pain.  Genitourinary:  Negative for dysuria, flank pain and hematuria.  Musculoskeletal:  Negative for back pain.  Skin:  Negative for rash.  Neurological:  Negative for dizziness and headaches.  Hematological:  Negative for adenopathy. Does not bruise/bleed easily.  Psychiatric/Behavioral:  Negative for behavioral problems, confusion and sleep disturbance. The patient is not nervous/anxious.     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     Social History   Socioeconomic History   Marital status: Married    Spouse name: Not on file   Number of children: Not on file   Years of education: Not on file   Highest education level: Not on file  Occupational History   Not on file  Tobacco Use   Smoking status: Never   Smokeless tobacco: Never   Tobacco comments:    NEVER USED TOBACCO  Vaping Use   Vaping Use: Never used  Substance and Sexual Activity   Alcohol use: No    Alcohol/week: 0.0 standard drinks   Drug use: No   Sexual activity: Yes  Other Topics Concern   Not  on file  Social History Narrative   2 Sons; grandson works in Recruitment consultant in [employer] for 8 years    Enjoys weightlifting    Social Determinants of Radio broadcast assistant Strain: Low Risk    Difficulty of Paying Living Expenses: Not hard at all  Food Insecurity: No Food Insecurity   Worried About Charity fundraiser in the Last Year: Never true   Arboriculturist in the Last Year: Never true  Transportation Needs: No Transportation Needs   Lack of Transportation (Medical): No   Lack of Transportation (Non-Medical): No  Physical Activity: Sufficiently Active   Days of Exercise per Week: 3 days   Minutes of Exercise per Session: 50 min  Stress: No Stress Concern Present   Feeling of Stress : Not at all  Social Connections: Moderately Isolated   Frequency of Communication with Friends and Family: More than three times a week   Frequency of Social Gatherings with Friends and Family: More than three times a week   Attends Religious Services: Never   Marine scientist or Organizations: No   Attends Archivist Meetings: Never   Marital Status: Married  Human resources officer Violence: Not At Risk   Fear of Current or Ex-Partner: No   Emotionally Abused: No   Physically Abused: No   Sexually Abused:  No    Past Surgical History:  Procedure Laterality Date   NO PAST SURGERIES      Family History  Problem Relation Age of Onset   Hypertension Mother    Hypertension Father    Hypertension Brother    Diabetes Brother    Healthy Child    Healthy Grandchild     No Known Allergies  Current Outpatient Medications on File Prior to Visit  Medication Sig Dispense Refill   aspirin 81 MG tablet Take 81 mg by mouth daily.     calcium & magnesium carbonates (MYLANTA) 311-232 MG per tablet Take 1 tablet by mouth daily.     Cholecalciferol 2000 UNITS TABS Take 1 tablet (2,000 Units total) by mouth daily at 12 noon. 30 each 2   furosemide (LASIX) 20 MG tablet Take 1  tablet (20 mg total) by mouth daily. 7 tablet 0   Multiple Vitamins-Minerals (VISION VITAMINS) TABS Take 2 tablets by mouth daily.     omeprazole (PRILOSEC) 10 MG capsule Take 10 mg by mouth as needed.     Zinc 100 MG TABS Take by mouth.     No current facility-administered medications on file prior to visit.    BP 140/60   Pulse 100   Resp 18   Ht 5' 8"  (1.727 m)   Wt 184 lb (83.5 kg)   SpO2 98%   BMI 27.98 kg/m       Objective:   Physical Exam  General- No acute distress. Pleasant patient. Neck- Full range of motion, no jvd Lungs- Clear, even and unlabored. Heart- regular rate and rhythm. Neurologic- CNII- XII grossly intact.   Lower ext- 1+ pedal edema. Symmetric edema. Negative homans signs.        Assessment & Plan:   Recent significant pedal edema.  Work-up was negative for DVT and negative for CHF.  1 week of Lasix has got your edema back to baseline and you report having lost about 9 pounds.  Your blood pressure is moderately elevated today.  I want to check metabolic panel to assess kidney function and electrolytes/potassium.  Would asked that you check your blood pressure daily over the next week.  Send me a report by MyChart regarding BP levels, degree of edema and weight.  If blood pressures are elevated similar to today that I think prescribing hydrochlorothiazide daily would be a better option than intermittent Lasix use.  Follow-up date to be determined after review of MyChart update in 1 week.  Return sooner if needed.

## 2020-08-28 NOTE — Patient Instructions (Signed)
Recent significant pedal edema.  Work-up was negative for DVT and negative for CHF.  1 week of Lasix has got your edema back to baseline and you report having lost about 9 pounds.  Your blood pressure is moderately elevated today.  I want to check metabolic panel to assess kidney function and electrolytes/potassium.  Would asked that you check your blood pressure daily over the next week.  Send me a report by MyChart regarding BP levels, degree of edema and weight.  If blood pressures are elevated similar to today that I think prescribing hydrochlorothiazide daily would be a better option than intermittent Lasix use.  Follow-up date to be determined after review of MyChart update in 1 week.  Return sooner if needed.

## 2020-08-31 ENCOUNTER — Encounter: Payer: Self-pay | Admitting: Medical

## 2020-09-01 ENCOUNTER — Telehealth: Payer: Self-pay | Admitting: Medical

## 2020-09-01 ENCOUNTER — Encounter: Payer: Self-pay | Admitting: Medical

## 2020-09-01 MED ORDER — HYDROCHLOROTHIAZIDE 12.5 MG PO CAPS: ORAL_CAPSULE | ORAL | 1 refills | Status: DC

## 2020-09-01 NOTE — Telephone Encounter (Signed)
Hctz 12.5 mg rx sent to pharmacy.

## 2020-12-25 ENCOUNTER — Inpatient Hospital Stay: Payer: Medicare Other | Attending: Hematology & Oncology

## 2020-12-25 ENCOUNTER — Inpatient Hospital Stay (HOSPITAL_BASED_OUTPATIENT_CLINIC_OR_DEPARTMENT_OTHER): Payer: Medicare Other | Admitting: Hematology & Oncology

## 2020-12-25 ENCOUNTER — Encounter: Payer: Self-pay | Admitting: Hematology & Oncology

## 2020-12-25 ENCOUNTER — Other Ambulatory Visit: Payer: Self-pay

## 2020-12-25 VITALS — BP 139/65 | HR 97 | Temp 97.9°F | Resp 18 | Wt 189.0 lb

## 2020-12-25 DIAGNOSIS — K7460 Unspecified cirrhosis of liver: Secondary | ICD-10-CM | POA: Insufficient documentation

## 2020-12-25 DIAGNOSIS — M7989 Other specified soft tissue disorders: Secondary | ICD-10-CM | POA: Diagnosis not present

## 2020-12-25 DIAGNOSIS — Z79899 Other long term (current) drug therapy: Secondary | ICD-10-CM | POA: Insufficient documentation

## 2020-12-25 DIAGNOSIS — D696 Thrombocytopenia, unspecified: Secondary | ICD-10-CM

## 2020-12-25 DIAGNOSIS — D72819 Decreased white blood cell count, unspecified: Secondary | ICD-10-CM | POA: Diagnosis not present

## 2020-12-25 LAB — CBC WITH DIFFERENTIAL (CANCER CENTER ONLY)
Abs Immature Granulocytes: 0.01 10*3/uL (ref 0.00–0.07)
Basophils Absolute: 0.1 10*3/uL (ref 0.0–0.1)
Basophils Relative: 2 %
Eosinophils Absolute: 0.1 10*3/uL (ref 0.0–0.5)
Eosinophils Relative: 3 %
HCT: 36.2 % — ABNORMAL LOW (ref 39.0–52.0)
Hemoglobin: 12.4 g/dL — ABNORMAL LOW (ref 13.0–17.0)
Immature Granulocytes: 0 %
Lymphocytes Relative: 27 %
Lymphs Abs: 1.2 10*3/uL (ref 0.7–4.0)
MCH: 34.3 pg — ABNORMAL HIGH (ref 26.0–34.0)
MCHC: 34.3 g/dL (ref 30.0–36.0)
MCV: 100 fL (ref 80.0–100.0)
Monocytes Absolute: 0.5 10*3/uL (ref 0.1–1.0)
Monocytes Relative: 12 %
Neutro Abs: 2.5 10*3/uL (ref 1.7–7.7)
Neutrophils Relative %: 56 %
Platelet Count: 55 10*3/uL — ABNORMAL LOW (ref 150–400)
RBC: 3.62 MIL/uL — ABNORMAL LOW (ref 4.22–5.81)
RDW: 14.3 % (ref 11.5–15.5)
WBC Count: 4.5 10*3/uL (ref 4.0–10.5)
nRBC: 0 % (ref 0.0–0.2)

## 2020-12-25 LAB — CMP (CANCER CENTER ONLY)
ALT: 21 U/L (ref 0–44)
AST: 31 U/L (ref 15–41)
Albumin: 2.8 g/dL — ABNORMAL LOW (ref 3.5–5.0)
Alkaline Phosphatase: 92 U/L (ref 38–126)
Anion gap: 7 (ref 5–15)
BUN: 13 mg/dL (ref 8–23)
CO2: 26 mmol/L (ref 22–32)
Calcium: 9 mg/dL (ref 8.9–10.3)
Chloride: 113 mmol/L — ABNORMAL HIGH (ref 98–111)
Creatinine: 0.85 mg/dL (ref 0.61–1.24)
GFR, Estimated: >60 mL/min (ref >60)
Glucose, Bld: 103 mg/dL — ABNORMAL HIGH (ref 70–99)
Potassium: 4.4 mmol/L (ref 3.5–5.1)
Sodium: 146 mmol/L — ABNORMAL HIGH (ref 135–145)
Total Bilirubin: 2.7 mg/dL — ABNORMAL HIGH (ref 0.3–1.2)
Total Protein: 5.7 g/dL — ABNORMAL LOW (ref 6.5–8.1)

## 2020-12-25 LAB — SAVE SMEAR(SSMR), FOR PROVIDER SLIDE REVIEW

## 2020-12-25 NOTE — Progress Notes (Signed)
Hematology and Oncology Follow Up Visit  Maxwell Lemen [id number] May 26, 1940 80 y.o. 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 Mccravy is back for follow-up.  He is doing quite nicely.  He had a very nice summer.  As always, please with his family.  He had a nice Thanksgiving.  He has no problems with bleeding or bruising.  He has had no issues with fever.  He has avoided COVID.  Is no change in bowel or bladder habits.  He has had no issues with cough or shortness of breath.  He does have some leg swelling.  He is on diuretics for this.  Overall, I would say performance status is ECOG 0.   Medications:  Current Outpatient Medications:    aspirin 81 MG tablet, Take 81 mg by mouth daily., Disp: , Rfl:    calcium & magnesium carbonates (MYLANTA) 311-232 MG per tablet, Take 1 tablet by mouth daily., Disp: , Rfl:    Cholecalciferol 2000 UNITS TABS, Take 1 tablet (2,000 Units total) by mouth daily at 12 noon., Disp: 30 each, Rfl: 2   hydrochlorothiazide (MICROZIDE) 12.5 MG capsule, 1 tab po every 2nd or 3rd day prn pedal edema, Disp: 15 capsule, Rfl: 1   Multiple Vitamins-Minerals (VISION VITAMINS) TABS, Take 2 tablets by mouth daily., Disp: , Rfl:    omeprazole (PRILOSEC) 10 MG capsule, Take 10 mg by mouth as needed., Disp: , Rfl:    Zinc 100 MG TABS, Take by mouth., Disp: , Rfl:   Allergies: No Known Allergies  Past Medical History, Surgical history, Social history, and Family History were reviewed and updated.  Review of Systems: Review of Systems  Constitutional: Negative.   HENT: Negative.    Eyes: Negative.   Respiratory: Negative.    Cardiovascular: Negative.   Gastrointestinal: Negative.   Genitourinary: Negative.   Musculoskeletal: Negative.   Skin: Negative.   Neurological: Negative.   Endo/Heme/Allergies: Negative.   Psychiatric/Behavioral:  Negative.      Physical Exam:  vitals were not taken for this visit.   Wt Readings from Last 3 Encounters:  08/28/20 184 lb (83.5 kg)  08/19/20 195 lb (88.5 kg)  07/17/20 187 lb (84.8 kg)     Physical Exam Vitals reviewed.  HENT:     Head: Normocephalic and atraumatic.  Eyes:     Pupils: Pupils are equal, round, and reactive to light.  Cardiovascular:     Rate and Rhythm: Normal rate and regular rhythm.     Heart sounds: Normal heart sounds.  Pulmonary:     Effort: Pulmonary effort is normal.     Breath sounds: Normal breath sounds.  Abdominal:     General: Bowel sounds are normal.     Palpations: Abdomen is soft.  Musculoskeletal:        General: No tenderness or deformity. Normal range of motion.     Cervical back: Normal range of motion.  Lymphadenopathy:     Cervical: No cervical adenopathy.  Skin:    General: Skin is warm and dry.     Findings: No erythema or rash.  Neurological:     Mental Status: He is alert and oriented to person, place, and time.  Psychiatric:        Behavior: Behavior normal.        Thought Content: Thought content normal.        Judgment: Judgment  normal.     Lab Results  Component Value Date   WBC 4.5 12/25/2020   HGB 12.4 (L) 12/25/2020   HCT 36.2 (L) 12/25/2020   MCV 100.0 12/25/2020   PLT 55 (L) 12/25/2020     Chemistry      Component Value Date/Time   NA 141 08/28/2020 1049   NA 138 07/13/2020 1322   K 4.1 08/28/2020 1049   CL 111 08/28/2020 1049   CO2 26 08/28/2020 1049   BUN 10 08/28/2020 1049   BUN 18 07/13/2020 1322   CREATININE 0.67 08/28/2020 1049   CREATININE 0.79 06/26/2020 0734      Component Value Date/Time   CALCIUM 8.5 08/28/2020 1049   ALKPHOS 91 08/28/2020 1049   AST 30 08/28/2020 1049   AST 30 06/26/2020 0734   ALT 18 08/28/2020 1049   ALT 19 06/26/2020 0734   BILITOT 2.0 (H) 08/28/2020 1049   BILITOT 2.1 (H) 07/13/2020 1322   BILITOT 1.5 (H) 06/26/2020 0734       Impression and Plan: Mr.  Rennaker is a 80 year old white male.  We have been following him now for several years.  So far, there really has not been any obvious change with his blood counts.  In a years time, his blood count has been holding stable.  His platelet counts certainly may fluctuate.  I do not think that they will drop on a continual basis.   I looked at his blood smear under the microscope.  I do not see anything that looked unusual.  I do not see anything that looked like any kind of progression.  I suppose he may have an element of myelodysplasia.  However, I just do not feel compelled to put him through a bone marrow biopsy right now.  We will plan to get him back in 6 months.    Volanda Napoleon, MD 12/2/20228:11 AM

## 2021-04-09 ENCOUNTER — Emergency Department (HOSPITAL_BASED_OUTPATIENT_CLINIC_OR_DEPARTMENT_OTHER)
Admission: EM | Admit: 2021-04-09 | Discharge: 2021-04-09 | Disposition: A | Discharge status: Home / Self Care | Payer: No Typology Code available for payment source | Attending: Emergency Medicine | Admitting: Emergency Medicine

## 2021-04-09 ENCOUNTER — Encounter (HOSPITAL_BASED_OUTPATIENT_CLINIC_OR_DEPARTMENT_OTHER): Payer: Self-pay | Admitting: *Deleted

## 2021-04-09 ENCOUNTER — Other Ambulatory Visit: Payer: Self-pay

## 2021-04-09 DIAGNOSIS — D72819 Decreased white blood cell count, unspecified: Secondary | ICD-10-CM | POA: Insufficient documentation

## 2021-04-09 DIAGNOSIS — R197 Diarrhea, unspecified: Secondary | ICD-10-CM

## 2021-04-09 DIAGNOSIS — Z7982 Long term (current) use of aspirin: Secondary | ICD-10-CM | POA: Insufficient documentation

## 2021-04-09 DIAGNOSIS — E860 Dehydration: Secondary | ICD-10-CM | POA: Diagnosis not present

## 2021-04-09 DIAGNOSIS — N3001 Acute cystitis with hematuria: Secondary | ICD-10-CM | POA: Insufficient documentation

## 2021-04-09 LAB — COMPREHENSIVE METABOLIC PANEL
ALT: 25 U/L (ref 0–44)
AST: 41 U/L (ref 15–41)
Albumin: 2.6 g/dL — ABNORMAL LOW (ref 3.5–5.0)
Alkaline Phosphatase: 93 U/L (ref 38–126)
Anion gap: 3 — ABNORMAL LOW (ref 5–15)
BUN: 14 mg/dL (ref 8–23)
CO2: 20 mmol/L — ABNORMAL LOW (ref 22–32)
Calcium: 8.5 mg/dL — ABNORMAL LOW (ref 8.9–10.3)
Chloride: 112 mmol/L — ABNORMAL HIGH (ref 98–111)
Creatinine, Ser: 0.72 mg/dL (ref 0.61–1.24)
GFR, Estimated: >60 mL/min (ref >60)
Glucose, Bld: 203 mg/dL — ABNORMAL HIGH (ref 70–99)
Potassium: 3.5 mmol/L (ref 3.5–5.1)
Sodium: 135 mmol/L (ref 135–145)
Total Bilirubin: 1.8 mg/dL — ABNORMAL HIGH (ref 0.3–1.2)
Total Protein: 5.5 g/dL — ABNORMAL LOW (ref 6.5–8.1)

## 2021-04-09 LAB — URINALYSIS, ROUTINE W REFLEX MICROSCOPIC
Bilirubin Urine: NEGATIVE
Glucose, UA: NEGATIVE mg/dL
Ketones, ur: NEGATIVE mg/dL
Nitrite: NEGATIVE
Protein, ur: NEGATIVE mg/dL
Specific Gravity, Urine: >=1.03 (ref 1.005–1.030)
pH: 6 (ref 5.0–8.0)

## 2021-04-09 LAB — CBC
HCT: 33.1 % — ABNORMAL LOW (ref 39.0–52.0)
Hemoglobin: 11.2 g/dL — ABNORMAL LOW (ref 13.0–17.0)
MCH: 33.3 pg (ref 26.0–34.0)
MCHC: 33.8 g/dL (ref 30.0–36.0)
MCV: 98.5 fL (ref 80.0–100.0)
Platelets: 68 10*3/uL — ABNORMAL LOW (ref 150–400)
RBC: 3.36 MIL/uL — ABNORMAL LOW (ref 4.22–5.81)
RDW: 15.2 % (ref 11.5–15.5)
WBC: 3.8 10*3/uL — ABNORMAL LOW (ref 4.0–10.5)
nRBC: 0 % (ref 0.0–0.2)

## 2021-04-09 LAB — URINALYSIS, MICROSCOPIC (REFLEX)

## 2021-04-09 LAB — OCCULT BLOOD X 1 CARD TO LAB, STOOL: Fecal Occult Bld: NEGATIVE

## 2021-04-09 LAB — LIPASE, BLOOD: Lipase: 47 U/L (ref 11–51)

## 2021-04-09 MED ORDER — SODIUM CHLORIDE 0.9 % IV BOLUS
1000.0000 mL | Freq: Once | INTRAVENOUS | Status: AC
  Administered 2021-04-09: 1000 mL via INTRAVENOUS

## 2021-04-09 MED ORDER — CEFTRIAXONE SODIUM 1 G IJ SOLR
1.0000 g | Freq: Once | INTRAMUSCULAR | Status: AC
  Administered 2021-04-09: 1 g via INTRAVENOUS
  Filled 2021-04-09: qty 10

## 2021-04-09 MED ORDER — CEPHALEXIN 500 MG PO CAPS: 500.0000 mg | ORAL_CAPSULE | Freq: Four times a day (QID) | ORAL | 0 refills | Status: DC

## 2021-04-09 NOTE — ED Provider Notes (Signed)
?[facility] ?Provider Note ? ? ?CSN: [id number] ?Arrival date & time: 04/09/21  1829 ? ?  ? ?History ? ?Chief Complaint  ?Patient presents with  ? Diarrhea  ? ? ?Steve Watson is a 81 y.o. male who presents to the ED today with complaint of gradual onset, constant, watery diarrhea x 2 weeks.  Patient is adamant that he does not want to be here and his wife made him come.  Wife reports that she thought that he had the flu 2 weeks ago due to having foul-smelling diarrhea.  She reports since that time he has been very weak and having to hold onto things while walking.  She advised him to come to the ED for further eval today.  Husband reports he feels fine.  States that he has not had diarrhea in over a week and has had more solid stools.  He denies any abdominal pain.  He denies any flulike symptoms including fevers, chills, fatigue, cough, body aches.  States he has been eating and drinking normally.  Does mention he has been drinking more sodas lately.  Denies any recent sick contacts. No recent abx use.  ? ?The history is provided by the patient, the spouse and medical records.  ? ?  ? ?Home Medications ?Prior to Admission medications   ?Medication Sig Start Date End Date Taking? Authorizing Provider  ?cephALEXin (KEFLEX) 500 MG capsule Take 1 capsule (500 mg total) by mouth 4 (four) times daily. 04/09/21  Yes Corliss Lamartina, PA-C  ?aspirin 81 MG tablet Take 81 mg by mouth daily.    [provider]  ?calcium & magnesium carbonates (MYLANTA) 311-232 MG per tablet Take 1 tablet by mouth daily.    [provider]  ?Cholecalciferol 2000 UNITS TABS Take 1 tablet (2,000 Units total) by mouth daily at 12 noon. 06/04/14   Brunetta Jeans, PA-C  ?hydrochlorothiazide (MICROZIDE) 12.5 MG capsule 1 tab po every 2nd or 3rd day prn pedal edema 09/01/20   Saguier, Percell Miller, PA-C  ?Multiple Vitamins-Minerals (VISION VITAMINS) TABS Take 2 tablets by mouth daily.    [provider]  ?omeprazole (PRILOSEC) 10 MG capsule Take 10 mg by mouth as needed.    [provider]  ?Zinc 100 MG TABS Take by mouth.    [provider]  ?   ? ?Allergies    ?Patient has no known allergies.   ? ?Review of Systems   ?Review of Systems  ?Constitutional:  Negative for chills, fatigue and fever.  ?Respiratory:  Negative for cough and shortness of breath.   ?Cardiovascular:  Negative for chest pain.  ?Gastrointestinal:  Positive for diarrhea. Negative for abdominal pain, nausea and vomiting.  ?Musculoskeletal:  Negative for myalgias.  ?Neurological:  Positive for light-headedness.  ?All other systems reviewed and are negative. ? ?Physical Exam ?Updated Vital Signs ?BP (!) 157/93   Pulse 94   Temp 98.3 ?F (36.8 ?C)   Resp 18   Ht 5' 8"  (1.727 m)   Wt 85.7 kg   SpO2 100%   BMI 28.73 kg/m?  ?Physical Exam ?Vitals and nursing note reviewed.  ?Constitutional:   ?   Appearance: He is not ill-appearing or diaphoretic.  ?HENT:  ?   Head: Normocephalic and atraumatic.  ?   Mouth/Throat:  ?   Mouth: Mucous membranes are dry.  ?Eyes:  ?   Conjunctiva/sclera: Conjunctivae normal.  ?Cardiovascular:  ?   Rate and Rhythm: Normal rate and regular rhythm.  ?  Pulses: Normal pulses.  ?Pulmonary:  ?   Effort: Pulmonary effort is normal.  ?   Breath sounds: Normal breath sounds. No wheezing, rhonchi or rales.  ?Abdominal:  ?   Palpations: Abdomen is soft.  ?   Tenderness: There is no abdominal tenderness. There is no guarding or rebound.  ?Musculoskeletal:  ?   Cervical back: Neck supple.  ?Skin: ?   General: Skin is warm and dry.  ?Neurological:  ?   Mental Status: He is alert.  ? ? ?ED Results / Procedures / Treatments   ?Labs ?(all labs ordered are listed, but only abnormal results are displayed) ?Labs Reviewed  ?COMPREHENSIVE METABOLIC PANEL - Abnormal; Notable for the following components:  ?    Result Value  ? Chloride 112 (*)   ? CO2 20 (*)   ? Glucose, Bld 203 (*)   ? Calcium 8.5 (*)   ?  Total Protein 5.5 (*)   ? Albumin 2.6 (*)   ? Total Bilirubin 1.8 (*)   ? Anion gap 3 (*)   ? All other components within normal limits  ?CBC - Abnormal; Notable for the following components:  ? WBC 3.8 (*)   ? RBC 3.36 (*)   ? Hemoglobin 11.2 (*)   ? HCT 33.1 (*)   ? Platelets 68 (*)   ? All other components within normal limits  ?URINALYSIS, ROUTINE W REFLEX MICROSCOPIC - Abnormal; Notable for the following components:  ? Hgb urine dipstick MODERATE (*)   ? Leukocytes,Ua SMALL (*)   ? All other components within normal limits  ?URINALYSIS, MICROSCOPIC (REFLEX) - Abnormal; Notable for the following components:  ? Bacteria, UA RARE (*)   ? All other components within normal limits  ?URINE CULTURE  ?LIPASE, BLOOD  ?OCCULT BLOOD X 1 CARD TO LAB, STOOL  ?POC OCCULT BLOOD, ED  ? ? ?EKG ?None ? ?Radiology ?No results found. ? ?Procedures ?Procedures  ? ? ?Medications Ordered in ED ?Medications  ?sodium chloride 0.9 % bolus 1,000 mL (0 mLs Intravenous Stopped 04/09/21 2127)  ?cefTRIAXone (ROCEPHIN) 1 g in sodium chloride 0.9 % 100 mL IVPB (0 g Intravenous Stopped 04/09/21 2104)  ? ? ?ED Course/ Medical Decision Making/ A&P ?  ?                        ?Medical Decision Making ?81 year old male who presents to the ED today with complaint of diarrhea x2 weeks and generalized weakness/lightheadedness.  On arrival to the ED patient is afebrile, nontachycardic and nontachypneic.  Pressure normotensive at 140/75.  Patient adamant that he does not want to be here and his wife made him come in.  Denies any recent sick contacts.  Denies abdominal pain.  Denies any recent antibiotic use to suggest C. difficile. ? ?Lab work was obtained prior to being seen including a CBC, CMP, lipase, UA. ? ?CBC with leukopenia of 3.8.  History of same.  Patient following with Dr. Marin Olp.  Platelets are low at 68 however patient with history of thrombocytopenia.  Denies alcohol ues.  Globin is slightly diminished compared to previous at 11.2 (12.4  three months ago).  CMP with a glucose of 203 and bicarb 20.  No gap.  No history of diabetes.  Chloride slightly increased at 112.  Bili unchanged at 1.8.  No other electrolyte abnormalities.  Lipase within normal limits at 47.  Urinalysis with small leukocytes, moderate hemoglobin on dipstick, 21-50 red blood cells, 21-50 white  blood cells with rare bacteria.  Patient denies any urinary symptoms at this time however given age and findings consistent with UTI will send for culture and treat with 1 dose of Rocephin in the ED today and discharged home with same. ? ?We will plan for labs as patient does appear dry on exam.  Abdomen is soft and nontender.  Lower suspicion for acute abdominal infection given nontender abdomen without complaints of same.  Patient able to eat and drink without difficulty at home over the last 2 weeks.  Do not feel patient requires CT scan due to only complaint of diarrhea.  We will plan for fecal occult testing at this time given hemoglobin has dropped 1 g over the past 3 months.  However he denies any melena or bright red blood.  ? ?Heme occult negative ? ?Pt reports he is ready to leave at this time. I do not feel he requires additional workup in the ED. Will discharge home with abx for UTI. Pt instructed to follow up with PCP for further eval.  ? ?Problems Addressed: ?Acute cystitis with hematuria: acute illness or injury ?Dehydration: acute illness or injury ?Diarrhea, unspecified type: acute illness or injury ? ?Amount and/or Complexity of Data Reviewed ?Labs: ordered. ? ? ? ? ? ? ? ? ? ?Final Clinical Impression(s) / ED Diagnoses ?Final diagnoses:  ?Diarrhea, unspecified type  ?Dehydration  ?Acute cystitis with hematuria  ? ? ?Rx / DC Orders ?ED Discharge Orders   ? ?      Ordered  ?  cephALEXin (KEFLEX) 500 MG capsule  4 times daily       ? 04/09/21 2128  ? ?  ?  ? ?  ? ? ? ?Discharge Instructions   ? ?  ?Please pick up antibiotics and take as prescribed to cover for a UTI. We have  sent your urine for culture and will call you in 2-3 days time IF the antibiotic needs to be changed ? ?Please drink plenty of fluids to stay hydrated and rest as much as possible.  ? ?Follow up with your PCP for furt

## 2021-04-09 NOTE — ED Triage Notes (Signed)
Diarrhea x 2 weeks. Now he is weak. He does not want to be here. His wife made him come. ?

## 2021-04-09 NOTE — Discharge Instructions (Addendum)
Please pick up antibiotics and take as prescribed to cover for a UTI. We have sent your urine for culture and will call you in 2-3 days time IF the antibiotic needs to be changed ? ?Please drink plenty of fluids to stay hydrated and rest as much as possible.  ? ?Follow up with your PCP for further eval.  ? ?Return to the ED for any new/worsening symptoms ?

## 2021-04-11 LAB — URINE CULTURE

## 2021-04-12 ENCOUNTER — Ambulatory Visit (INDEPENDENT_AMBULATORY_CARE_PROVIDER_SITE_OTHER): Payer: No Typology Code available for payment source | Admitting: Medical

## 2021-04-12 ENCOUNTER — Encounter: Payer: Self-pay | Admitting: Medical

## 2021-04-12 VITALS — BP 140/70 | HR 100 | Resp 18 | Ht 68.0 in | Wt 190.6 lb

## 2021-04-12 DIAGNOSIS — R197 Diarrhea, unspecified: Secondary | ICD-10-CM

## 2021-04-12 DIAGNOSIS — R739 Hyperglycemia, unspecified: Secondary | ICD-10-CM | POA: Diagnosis not present

## 2021-04-12 DIAGNOSIS — E860 Dehydration: Secondary | ICD-10-CM

## 2021-04-12 DIAGNOSIS — N3001 Acute cystitis with hematuria: Secondary | ICD-10-CM

## 2021-04-12 DIAGNOSIS — D649 Anemia, unspecified: Secondary | ICD-10-CM

## 2021-04-12 LAB — HEMOGLOBIN A1C: Hgb A1c MFr Bld: 4.7 % (ref 4.6–6.5)

## 2021-04-12 LAB — CBC WITH DIFFERENTIAL/PLATELET
Basophils Absolute: 0 10*3/uL (ref 0.0–0.1)
Basophils Relative: 1.2 % (ref 0.0–3.0)
Eosinophils Absolute: 0.1 10*3/uL (ref 0.0–0.7)
Eosinophils Relative: 2.7 % (ref 0.0–5.0)
HCT: 32 % — ABNORMAL LOW (ref 39.0–52.0)
Hemoglobin: 10.9 g/dL — ABNORMAL LOW (ref 13.0–17.0)
Lymphocytes Relative: 19.2 % (ref 12.0–46.0)
Lymphs Abs: 0.6 10*3/uL — ABNORMAL LOW (ref 0.7–4.0)
MCHC: 34 g/dL (ref 30.0–36.0)
MCV: 98.9 fl (ref 78.0–100.0)
Monocytes Absolute: 0.5 10*3/uL (ref 0.1–1.0)
Monocytes Relative: 14.7 % — ABNORMAL HIGH (ref 3.0–12.0)
Neutro Abs: 2 10*3/uL (ref 1.4–7.7)
Neutrophils Relative %: 62.2 % (ref 43.0–77.0)
Platelets: 61 10*3/uL — ABNORMAL LOW (ref 150.0–400.0)
RBC: 3.24 Mil/uL — ABNORMAL LOW (ref 4.22–5.81)
RDW: 15.9 % — ABNORMAL HIGH (ref 11.5–15.5)
WBC: 3.3 10*3/uL — ABNORMAL LOW (ref 4.0–10.5)

## 2021-04-12 LAB — COMPREHENSIVE METABOLIC PANEL
ALT: 22 U/L (ref 0–53)
AST: 33 U/L (ref 0–37)
Albumin: 2.6 g/dL — ABNORMAL LOW (ref 3.5–5.2)
Alkaline Phosphatase: 91 U/L (ref 39–117)
BUN: 12 mg/dL (ref 6–23)
CO2: 26 mEq/L (ref 19–32)
Calcium: 8.1 mg/dL — ABNORMAL LOW (ref 8.4–10.5)
Chloride: 111 mEq/L (ref 96–112)
Creatinine, Ser: 0.69 mg/dL (ref 0.40–1.50)
GFR: 88.31 mL/min (ref >60.00)
Glucose, Bld: 84 mg/dL (ref 70–99)
Potassium: 3.5 mEq/L (ref 3.5–5.1)
Sodium: 142 mEq/L (ref 135–145)
Total Bilirubin: 1.8 mg/dL — ABNORMAL HIGH (ref 0.2–1.2)
Total Protein: 5.3 g/dL — ABNORMAL LOW (ref 6.0–8.3)

## 2021-04-12 NOTE — Addendum Note (Signed)
Addended by: Anabel Halon on: 04/12/2021 05:13 PM ? ? Modules accepted: Orders ? ?

## 2021-04-12 NOTE — Patient Instructions (Addendum)
Recent acute cystitis, diarrhea, dehydration elevated sugar and mild anemia. ? ?For acute cystitis continue Keflex antibiotic and placing future urinalysis to be done in 2 weeks.  Want to make sure blood no longer present. ? ?Diarrhea resolved and clinically dehydration resolved.  You report immediately feeling better after receiving IV fluids in ED. ? ?Elevated sugar in ED.  We will get metabolic panel and K9T today. ? ?Mild anemia recently.  Dropping hemoglobin compared to the 3 months ago.  Stool card negative for blood today.  We will repeat CBC and make sure hemoglobin/hematocrit not dropping further. ? ?Blood pressure improved on recheck. ? ?Follow-up date to be determined after lab review. ?

## 2021-04-12 NOTE — Progress Notes (Signed)
? ?Subjective:  ? ? Patient ID: Steve Watson, male    DOB: 1940/11/30, 81 y.o.   MRN: [id number] ? ?HPI ?Pt in for follow from the ED. Pt state he feels a lot better compared to when he was in the ED. He states at time he went to ED felt fatigued and off blance. Pt states this happened about week after flu like illness. Pt states also had some diarrhea before he went to the ED. ? ?Pt states he felt much better after getting iv fluids in the emergency department. ? ? ? ?Arrival date & time: 04/09/21  1829 ?  ?  ?History ? " ?   ?Chief Complaint  ?Patient presents with  ? Diarrhea  ?  ?  ?Steve Watson is a 81 y.o. male who presents to the ED today with complaint of gradual onset, constant, watery diarrhea x 2 weeks.  Patient is adamant that he does not want to be here and his wife made him come.  Wife reports that she thought that he had the flu 2 weeks ago due to having foul-smelling diarrhea.  She reports since that time he has been very weak and having to hold onto things while walking.  She advised him to come to the ED for further eval today.  Husband reports he feels fine.  States that he has not had diarrhea in over a week and has had more solid stools.  He denies any abdominal pain.  He denies any flulike symptoms including fevers, chills, fatigue, cough, body aches.  States he has been eating and drinking normally.  Does mention he has been drinking more sodas lately.  Denies any recent sick contacts. No recent abx use.  ?  ?In ED sugar was 203. Wbc mild low. Urine had some blood and small infection fighting cells.  ? ? ?Pt got 1 L fluids and 1 gram rocephin." ? ? ? ?"Medical Decision Making ?81 year old male who presents to the ED today with complaint of diarrhea x2 weeks and generalized weakness/lightheadedness.  On arrival to the ED patient is afebrile, nontachycardic and nontachypneic.  Pressure normotensive at 140/75.  Patient adamant that he does not want to be here and his wife made him come in.   Denies any recent sick contacts.  Denies abdominal pain.  Denies any recent antibiotic use to suggest C. difficile. ?  ?Lab work was obtained prior to being seen including a CBC, CMP, lipase, UA. ?  ?CBC with leukopenia of 3.8.  History of same.  Patient following with Dr. Marin Olp.  Platelets are low at 68 however patient with history of thrombocytopenia.  Denies alcohol ues.  Globin is slightly diminished compared to previous at 11.2 (12.4 three months ago).  CMP with a glucose of 203 and bicarb 20.  No gap.  No history of diabetes.  Chloride slightly increased at 112.  Bili unchanged at 1.8.  No other electrolyte abnormalities.  Lipase within normal limits at 47.  Urinalysis with small leukocytes, moderate hemoglobin on dipstick, 21-50 red blood cells, 21-50 white blood cells with rare bacteria.  Patient denies any urinary symptoms at this time however given age and findings consistent with UTI will send for culture and treat with 1 dose of Rocephin in the ED today and discharged home with same. ?  ?We will plan for labs as patient does appear dry on exam.  Abdomen is soft and nontender.  Lower suspicion for acute abdominal infection given nontender abdomen without complaints of same.  Patient able to eat and drink without difficulty at home over the last 2 weeks.  Do not feel patient requires CT scan due to only complaint of diarrhea.  We will plan for fecal occult testing at this time given hemoglobin has dropped 1 g over the past 3 months.  However he denies any melena or bright red blood.  ?  ?Heme occult negative ?  ?Pt reports he is ready to leave at this time. I do not feel he requires additional workup in the ED. Will discharge home with abx for UTI. Pt instructed to follow up with PCP for further eval.  ?  ?Problems Addressed: ?Acute cystitis with hematuria: acute illness or injury ?Dehydration: acute illness or injury ?Diarrhea, unspecified type: acute illness or injury" ? ? ?Dc'd on keflex. ?Still  having some slight frequent uriantion. ? ?Pt tells me ED did fecal blood testing. I don't see that in epic? ? ? ?Review of Systems  ?Constitutional:  Negative for chills, fatigue and fever.  ?HENT:  Negative for congestion and drooling.   ?Respiratory:  Negative for cough, chest tightness, shortness of breath and wheezing.   ?Cardiovascular:  Negative for chest pain and palpitations.  ?Gastrointestinal:  Negative for abdominal pain, constipation, nausea and vomiting.  ?Genitourinary:  Negative for dysuria, flank pain, frequency, hematuria and testicular pain.  ?Musculoskeletal:  Negative for back pain and myalgias.  ?Skin:  Negative for rash.  ?Psychiatric/Behavioral:  Negative for behavioral problems and dysphoric mood.   ? ?   ?Objective:  ? Physical Exam ? ?General ?Mental Status- Alert. General Appearance- Not in acute distress.  ? ?Skin ?General: Color- Normal Color. Moisture- Normal Moisture. ? ?Neck ?Carotid Arteries- Normal color. Moisture- Normal Moisture. No carotid bruits. No JVD. ? ?Chest and Lung Exam ?Auscultation: ?Breath Sounds:-Normal. ? ?Cardiovascular ?Auscultation:Rythm- Regular. ?Murmurs & Other Heart Sounds:Auscultation of the heart reveals- No Murmurs. ? ?Abdomen ?Inspection:-Inspeection Normal. ?Palpation/Percussion:Note:No mass. Palpation and Percussion of the abdomen reveal- Non Tender, Non Distended + BS, no rebound or guarding. ? ? ?Neurologic ?Cranial Nerve exam:- CN III-XII intact(No nystagmus), symmetric smile. ?Finger to Nose:- Normal/Intact ?Strength:- 5/5 equal and symmetric strength both upper and lower extremities.  ? ?Rectal- normal sphincter. Stool card negative for blood. ?   ?Assessment & Plan:  ? ?Patient Instructions  ?Recent acute cystitis, diarrhea, dehydration elevated sugar and mild anemia. ? ?For acute cystitis continue Keflex antibiotic and placing future urinalysis to be done in 2 weeks.  Want to make sure blood no longer present. ? ?Diarrhea resolved and clinically  dehydration resolved.  You report immediately feeling better after receiving IV fluids in ED. ? ?Elevated sugar in ED.  We will get metabolic panel and S9F today. ? ?Mild anemia recently.  Dropping hemoglobin compared to the 3 months ago.  Stool card negative for blood today.  We will repeat CBC and make sure hemoglobin/hematocrit not dropping further. ? ?Blood pressure improved on recheck. ? ?Follow-up date to be determined after lab review.  ? ?Mackie Pai, PA-C  ?

## 2021-04-26 ENCOUNTER — Ambulatory Visit: Payer: No Typology Code available for payment source | Admitting: Medical

## 2021-04-27 ENCOUNTER — Encounter: Payer: Self-pay | Admitting: Medical

## 2021-04-27 ENCOUNTER — Ambulatory Visit (INDEPENDENT_AMBULATORY_CARE_PROVIDER_SITE_OTHER): Payer: No Typology Code available for payment source | Admitting: Medical

## 2021-04-27 VITALS — BP 140/70 | HR 98 | Ht 68.0 in | Wt 193.4 lb

## 2021-04-27 DIAGNOSIS — R790 Abnormal level of blood mineral: Secondary | ICD-10-CM

## 2021-04-27 DIAGNOSIS — D696 Thrombocytopenia, unspecified: Secondary | ICD-10-CM

## 2021-04-27 DIAGNOSIS — I10 Essential (primary) hypertension: Secondary | ICD-10-CM | POA: Diagnosis not present

## 2021-04-27 DIAGNOSIS — D649 Anemia, unspecified: Secondary | ICD-10-CM | POA: Diagnosis not present

## 2021-04-27 LAB — IRON: Iron: 244 ug/dL — ABNORMAL HIGH (ref 42–165)

## 2021-04-27 LAB — CBC WITH DIFFERENTIAL/PLATELET
Basophils Absolute: 0.1 10*3/uL (ref 0.0–0.1)
Basophils Relative: 1.9 % (ref 0.0–3.0)
Eosinophils Absolute: 0.1 10*3/uL (ref 0.0–0.7)
Eosinophils Relative: 2.8 % (ref 0.0–5.0)
HCT: 33.9 % — ABNORMAL LOW (ref 39.0–52.0)
Hemoglobin: 11.6 g/dL — ABNORMAL LOW (ref 13.0–17.0)
Lymphocytes Relative: 20.7 % (ref 12.0–46.0)
Lymphs Abs: 0.8 10*3/uL (ref 0.7–4.0)
MCHC: 34.1 g/dL (ref 30.0–36.0)
MCV: 100.2 fl — ABNORMAL HIGH (ref 78.0–100.0)
Monocytes Absolute: 0.5 10*3/uL (ref 0.1–1.0)
Monocytes Relative: 13.6 % — ABNORMAL HIGH (ref 3.0–12.0)
Neutro Abs: 2.2 10*3/uL (ref 1.4–7.7)
Neutrophils Relative %: 61 % (ref 43.0–77.0)
Platelets: 73 10*3/uL — ABNORMAL LOW (ref 150.0–400.0)
RBC: 3.39 Mil/uL — ABNORMAL LOW (ref 4.22–5.81)
RDW: 16.3 % — ABNORMAL HIGH (ref 11.5–15.5)
WBC: 3.6 10*3/uL — ABNORMAL LOW (ref 4.0–10.5)

## 2021-04-27 MED ORDER — HYDROCHLOROTHIAZIDE 12.5 MG PO CAPS: 12.5000 mg | ORAL_CAPSULE | Freq: Every day | ORAL | 1 refills | Status: DC

## 2021-04-27 NOTE — Patient Instructions (Addendum)
Htn- mild borderline elevation and you also have dependent edema. Rx hctz 12.5 mg daily. Elevate legs at end of day ad use compression socks.  ? ?For anemia and low platelets will repeat cbc and iron level.  Will update Dr. Marin Olp on levels. ? ?Elevated sugar with a1c that was not in diabetic range. Do recommend low sugar diet. ? ?Resolved cystitis, diarrhea and dehydration. ? ?Follow up date to be determined after lab review. ?

## 2021-04-27 NOTE — Progress Notes (Signed)
? ?Subjective:  ? ? Patient ID: Steve Watson, male    DOB: 12-03-1940, 81 y.o.   MRN: [id number] ? ?HPI ? ?Pt in for follow up from last.  ? ?"Recent acute cystitis, diarrhea, dehydration elevated sugar and mild anemia. ?  ?For acute cystitis continue Keflex antibiotic and placing future urinalysis to be done in 2 weeks.  Want to make sure blood no longer present. ?  ?Diarrhea resolved and clinically dehydration resolved.  You report immediately feeling better after receiving IV fluids in ED. ?  ?Elevated sugar in ED.  We will get metabolic panel and B1D today. ?  ?Mild anemia recently.  Dropping hemoglobin compared to the 3 months ago.  Stool card negative for blood today.  We will repeat CBC and make sure hemoglobin/hematocrit not dropping further." ? ?Pt states he feels fine now. Back to normal with no fatigue. ? ? ?On work up hb and hct was slight decreased.  Also history of  low platelets.  ? ? ? ?Review of Systems  ?Constitutional:  Negative for chills, fatigue and fever.  ?HENT:  Negative for congestion and drooling.   ?Respiratory:  Negative for cough, chest tightness and wheezing.   ?Cardiovascular:  Negative for chest pain and palpitations.  ?Gastrointestinal:  Negative for abdominal distention, abdominal pain, anal bleeding, blood in stool, nausea and vomiting.  ?Genitourinary:  Negative for dysuria, enuresis, frequency, hematuria and penile discharge.  ?Musculoskeletal:  Negative for back pain.  ?Skin:  Negative for rash.  ? ? ?Past Medical History:  ?Diagnosis Date  ? Chicken pox   ? GERD (gastroesophageal reflux disease)   ? Leukopenia 08/17/2015  ? 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 ?  ?Social History  ? ?Socioeconomic History  ? Marital status: Married  ?  Spouse name: Not on file  ? Number of children: Not on file  ? Years of education: Not on file  ? Highest education level: Not on file  ?Occupational History  ? Not on file  ?Tobacco Use  ? Smoking status: Never  ? Smokeless tobacco: Never  ?  Tobacco comments:  ?  NEVER USED TOBACCO  ?Vaping Use  ? Vaping Use: Never used  ?Substance and Sexual Activity  ? Alcohol use: No  ?  Alcohol/week: 0.0 standard drinks  ? Drug use: No  ? Sexual activity: Yes  ?Other Topics Concern  ? Not on file  ?Social History Narrative  ? 2 Sons; grandson works in healthcare   ? Served in [employer] for 8 years   ? Enjoys weightlifting   ? ?Social Determinants of Health  ? ?Financial Resource Strain: Not on file  ?Food Insecurity: Not on file  ?Transportation Needs: Not on file  ?Physical Activity: Not on file  ?Stress: Not on file  ?Social Connections: Not on file  ?Intimate Partner Violence: Not on file  ? ? ?Past Surgical History:  ?Procedure Laterality Date  ? NO PAST SURGERIES    ? ? ?Family History  ?Problem Relation Age of Onset  ? Hypertension Mother   ? Hypertension Father   ? Hypertension Brother   ? Diabetes Brother   ? Healthy Child   ? Healthy Grandchild   ? ? ?No Known Allergies ? ?Current Outpatient Medications on File Prior to Visit  ?Medication Sig Dispense Refill  ? aspirin 81 MG tablet Take 81 mg by mouth daily.    ? calcium & magnesium carbonates (MYLANTA) 311-232 MG per tablet Take 1 tablet by mouth daily.    ?  cephALEXin (KEFLEX) 500 MG capsule Take 1 capsule (500 mg total) by mouth 4 (four) times daily. 20 capsule 0  ? Cholecalciferol 2000 UNITS TABS Take 1 tablet (2,000 Units total) by mouth daily at 12 noon. 30 each 2  ? hydrochlorothiazide (MICROZIDE) 12.5 MG capsule 1 tab po every 2nd or 3rd day prn pedal edema 15 capsule 1  ? Multiple Vitamins-Minerals (VISION VITAMINS) TABS Take 2 tablets by mouth daily.    ? omeprazole (PRILOSEC) 10 MG capsule Take 10 mg by mouth as needed.    ? Zinc 100 MG TABS Take by mouth.    ? ?No current facility-administered medications on file prior to visit.  ? ? ?BP 139/65   Pulse 98   Ht 5' 8"  (1.727 m)   Wt 193 lb 6.4 oz (87.7 kg)   SpO2 97%   BMI 29.41 kg/m?  ?  ?   ?Objective:  ? Physical Exam ? ? ?General ?Mental  Status- Alert. General Appearance- Not in acute distress.  ? ?Skin ?General: Color- Normal Color. Moisture- Normal Moisture. ? ?Neck ?Carotid Arteries- Normal color. Moisture- Normal Moisture. No carotid bruits. No JVD. ? ?Chest and Lung Exam ?Auscultation: ?Breath Sounds:-Normal. ? ?Cardiovascular ?Auscultation:Rythm- Regular. ?Murmurs & Other Heart Sounds:Auscultation of the heart reveals- No Murmurs. ? ?Abdomen ?Inspection:-Inspeection Normal. ?Palpation/Percussion:Note:No mass. Palpation and Percussion of the abdomen reveal- Non Tender, Non Distended + BS, no rebound or guarding. ? ? ?Neurologic ?Cranial Nerve exam:- CN III-XII intact(No nystagmus), symmetric smile. ?Drift Test:- No drift. ?Strength:- 5/5 equal and symmetric strength both upper and lower extremities.  ? ?Lower ext- bilateral 2-3+ pedal edema.  ?   ?Assessment & Plan:  ? ?Patient Instructions  ?Htn- mild borderline elevation and you also have dependent edema. Rx hctz 12.5 mg daily. Elevate legs at end of day ad use compression socks.  ? ?For anemia and low platelets will repeat cbc and iron level.  Will update Dr. Marin Olp on levels. ? ?Elevated sugar with a1c that was not in diabetic range. Do recommend low sugar diet. ? ?Resolved cystitis, diarrhea and dehydration. ? ?Follow up date to be determined after lab review.  ? ? ?Mackie Pai, PA-C  ?

## 2021-04-30 NOTE — Addendum Note (Signed)
Addended by: Anabel Halon on: 04/30/2021 12:57 PM ? ? Modules accepted: Orders ? ?

## 2021-05-18 ENCOUNTER — Other Ambulatory Visit (INDEPENDENT_AMBULATORY_CARE_PROVIDER_SITE_OTHER): Payer: No Typology Code available for payment source

## 2021-05-18 DIAGNOSIS — D649 Anemia, unspecified: Secondary | ICD-10-CM

## 2021-05-18 DIAGNOSIS — R790 Abnormal level of blood mineral: Secondary | ICD-10-CM

## 2021-05-18 LAB — IRON: Iron: 137 ug/dL (ref 42–165)

## 2021-05-18 LAB — CBC WITH DIFFERENTIAL/PLATELET
Basophils Absolute: 0 10*3/uL (ref 0.0–0.1)
Basophils Relative: 1.1 % (ref 0.0–3.0)
Eosinophils Absolute: 0.1 10*3/uL (ref 0.0–0.7)
Eosinophils Relative: 3.5 % (ref 0.0–5.0)
HCT: 33.1 % — ABNORMAL LOW (ref 39.0–52.0)
Hemoglobin: 11.4 g/dL — ABNORMAL LOW (ref 13.0–17.0)
Lymphocytes Relative: 21.7 % (ref 12.0–46.0)
Lymphs Abs: 0.7 10*3/uL (ref 0.7–4.0)
MCHC: 34.5 g/dL (ref 30.0–36.0)
MCV: 99.8 fl (ref 78.0–100.0)
Monocytes Absolute: 0.4 10*3/uL (ref 0.1–1.0)
Monocytes Relative: 10.7 % (ref 3.0–12.0)
Neutro Abs: 2.1 10*3/uL (ref 1.4–7.7)
Neutrophils Relative %: 63 % (ref 43.0–77.0)
Platelets: 64 10*3/uL — ABNORMAL LOW (ref 150.0–400.0)
RBC: 3.31 Mil/uL — ABNORMAL LOW (ref 4.22–5.81)
RDW: 16.3 % — ABNORMAL HIGH (ref 11.5–15.5)
WBC: 3.4 10*3/uL — ABNORMAL LOW (ref 4.0–10.5)

## 2021-05-27 ENCOUNTER — Ambulatory Visit: Payer: No Typology Code available for payment source

## 2021-06-25 ENCOUNTER — Emergency Department (HOSPITAL_BASED_OUTPATIENT_CLINIC_OR_DEPARTMENT_OTHER): Payer: No Typology Code available for payment source

## 2021-06-25 ENCOUNTER — Encounter (HOSPITAL_BASED_OUTPATIENT_CLINIC_OR_DEPARTMENT_OTHER): Payer: Self-pay

## 2021-06-25 ENCOUNTER — Inpatient Hospital Stay (HOSPITAL_BASED_OUTPATIENT_CLINIC_OR_DEPARTMENT_OTHER): Payer: No Typology Code available for payment source | Admitting: Hematology & Oncology

## 2021-06-25 ENCOUNTER — Inpatient Hospital Stay (HOSPITAL_BASED_OUTPATIENT_CLINIC_OR_DEPARTMENT_OTHER)
Admission: EM | Admit: 2021-06-25 | Discharge: 2021-06-29 | DRG: 442 | Disposition: A | Discharge status: Home Health | Payer: No Typology Code available for payment source | Attending: Internal Medicine | Admitting: Internal Medicine

## 2021-06-25 ENCOUNTER — Other Ambulatory Visit: Payer: Self-pay

## 2021-06-25 ENCOUNTER — Encounter: Payer: Self-pay | Admitting: Hematology & Oncology

## 2021-06-25 ENCOUNTER — Inpatient Hospital Stay: Payer: No Typology Code available for payment source | Attending: Hematology & Oncology

## 2021-06-25 VITALS — BP 154/68 | HR 95 | Temp 98.0°F | Resp 17

## 2021-06-25 DIAGNOSIS — E876 Hypokalemia: Secondary | ICD-10-CM | POA: Diagnosis not present

## 2021-06-25 DIAGNOSIS — Z6828 Body mass index (BMI) 28.0-28.9, adult: Secondary | ICD-10-CM

## 2021-06-25 DIAGNOSIS — R197 Diarrhea, unspecified: Secondary | ICD-10-CM | POA: Diagnosis not present

## 2021-06-25 DIAGNOSIS — K269 Duodenal ulcer, unspecified as acute or chronic, without hemorrhage or perforation: Secondary | ICD-10-CM | POA: Diagnosis not present

## 2021-06-25 DIAGNOSIS — Z833 Family history of diabetes mellitus: Secondary | ICD-10-CM

## 2021-06-25 DIAGNOSIS — E872 Acidosis, unspecified: Secondary | ICD-10-CM | POA: Diagnosis not present

## 2021-06-25 DIAGNOSIS — I8510 Secondary esophageal varices without bleeding: Secondary | ICD-10-CM

## 2021-06-25 DIAGNOSIS — Z79899 Other long term (current) drug therapy: Secondary | ICD-10-CM | POA: Diagnosis not present

## 2021-06-25 DIAGNOSIS — G4710 Hypersomnia, unspecified: Secondary | ICD-10-CM | POA: Diagnosis present

## 2021-06-25 DIAGNOSIS — K7682 Hepatic encephalopathy: Secondary | ICD-10-CM | POA: Diagnosis not present

## 2021-06-25 DIAGNOSIS — K7581 Nonalcoholic steatohepatitis (NASH): Secondary | ICD-10-CM | POA: Diagnosis not present

## 2021-06-25 DIAGNOSIS — D696 Thrombocytopenia, unspecified: Secondary | ICD-10-CM

## 2021-06-25 DIAGNOSIS — E663 Overweight: Secondary | ICD-10-CM | POA: Diagnosis not present

## 2021-06-25 DIAGNOSIS — R600 Localized edema: Secondary | ICD-10-CM | POA: Diagnosis not present

## 2021-06-25 DIAGNOSIS — I10 Essential (primary) hypertension: Secondary | ICD-10-CM | POA: Diagnosis not present

## 2021-06-25 DIAGNOSIS — D689 Coagulation defect, unspecified: Secondary | ICD-10-CM | POA: Diagnosis present

## 2021-06-25 DIAGNOSIS — K219 Gastro-esophageal reflux disease without esophagitis: Secondary | ICD-10-CM | POA: Diagnosis present

## 2021-06-25 DIAGNOSIS — R000 Tachycardia, unspecified: Secondary | ICD-10-CM | POA: Diagnosis not present

## 2021-06-25 DIAGNOSIS — Z8249 Family history of ischemic heart disease and other diseases of the circulatory system: Secondary | ICD-10-CM | POA: Diagnosis not present

## 2021-06-25 DIAGNOSIS — E119 Type 2 diabetes mellitus without complications: Secondary | ICD-10-CM | POA: Diagnosis present

## 2021-06-25 DIAGNOSIS — R918 Other nonspecific abnormal finding of lung field: Secondary | ICD-10-CM | POA: Diagnosis not present

## 2021-06-25 DIAGNOSIS — R8281 Pyuria: Secondary | ICD-10-CM | POA: Diagnosis not present

## 2021-06-25 DIAGNOSIS — K7460 Unspecified cirrhosis of liver: Secondary | ICD-10-CM | POA: Diagnosis not present

## 2021-06-25 DIAGNOSIS — D539 Nutritional anemia, unspecified: Secondary | ICD-10-CM | POA: Diagnosis not present

## 2021-06-25 DIAGNOSIS — K7290 Hepatic failure, unspecified without coma: Secondary | ICD-10-CM | POA: Diagnosis not present

## 2021-06-25 DIAGNOSIS — D649 Anemia, unspecified: Secondary | ICD-10-CM | POA: Diagnosis present

## 2021-06-25 DIAGNOSIS — E8809 Other disorders of plasma-protein metabolism, not elsewhere classified: Secondary | ICD-10-CM | POA: Diagnosis present

## 2021-06-25 DIAGNOSIS — K766 Portal hypertension: Secondary | ICD-10-CM | POA: Diagnosis present

## 2021-06-25 DIAGNOSIS — R410 Disorientation, unspecified: Secondary | ICD-10-CM | POA: Diagnosis not present

## 2021-06-25 DIAGNOSIS — R161 Splenomegaly, not elsewhere classified: Secondary | ICD-10-CM | POA: Diagnosis not present

## 2021-06-25 DIAGNOSIS — R262 Difficulty in walking, not elsewhere classified: Secondary | ICD-10-CM | POA: Diagnosis not present

## 2021-06-25 DIAGNOSIS — Z7982 Long term (current) use of aspirin: Secondary | ICD-10-CM | POA: Diagnosis not present

## 2021-06-25 DIAGNOSIS — R9431 Abnormal electrocardiogram [ECG] [EKG]: Secondary | ICD-10-CM | POA: Diagnosis present

## 2021-06-25 DIAGNOSIS — K7469 Other cirrhosis of liver: Secondary | ICD-10-CM | POA: Diagnosis present

## 2021-06-25 DIAGNOSIS — K2970 Gastritis, unspecified, without bleeding: Secondary | ICD-10-CM | POA: Diagnosis not present

## 2021-06-25 DIAGNOSIS — R531 Weakness: Secondary | ICD-10-CM | POA: Diagnosis not present

## 2021-06-25 DIAGNOSIS — D72819 Decreased white blood cell count, unspecified: Secondary | ICD-10-CM | POA: Diagnosis present

## 2021-06-25 LAB — CBC WITH DIFFERENTIAL/PLATELET
Abs Immature Granulocytes: 0.01 10*3/uL (ref 0.00–0.07)
Basophils Absolute: 0 10*3/uL (ref 0.0–0.1)
Basophils Relative: 1 %
Eosinophils Absolute: 0.1 10*3/uL (ref 0.0–0.5)
Eosinophils Relative: 2 %
HCT: 32.9 % — ABNORMAL LOW (ref 39.0–52.0)
Hemoglobin: 11.7 g/dL — ABNORMAL LOW (ref 13.0–17.0)
Immature Granulocytes: 0 %
Lymphocytes Relative: 16 %
Lymphs Abs: 0.7 10*3/uL (ref 0.7–4.0)
MCH: 34.4 pg — ABNORMAL HIGH (ref 26.0–34.0)
MCHC: 35.6 g/dL (ref 30.0–36.0)
MCV: 96.8 fL (ref 80.0–100.0)
Monocytes Absolute: 0.5 10*3/uL (ref 0.1–1.0)
Monocytes Relative: 11 %
Neutro Abs: 3 10*3/uL (ref 1.7–7.7)
Neutrophils Relative %: 70 %
Platelets: 65 10*3/uL — ABNORMAL LOW (ref 150–400)
RBC: 3.4 MIL/uL — ABNORMAL LOW (ref 4.22–5.81)
RDW: 15.5 % (ref 11.5–15.5)
WBC: 4.2 10*3/uL (ref 4.0–10.5)
nRBC: 0 % (ref 0.0–0.2)

## 2021-06-25 LAB — PROTIME-INR
INR: 1.4 — ABNORMAL HIGH (ref 0.8–1.2)
Prothrombin Time: 17.4 seconds — ABNORMAL HIGH (ref 11.4–15.2)

## 2021-06-25 LAB — CBC WITH DIFFERENTIAL (CANCER CENTER ONLY)
Abs Immature Granulocytes: 0 10*3/uL (ref 0.00–0.07)
Basophils Absolute: 0.1 10*3/uL (ref 0.0–0.1)
Basophils Relative: 1 %
Eosinophils Absolute: 0.1 10*3/uL (ref 0.0–0.5)
Eosinophils Relative: 3 %
HCT: 35.1 % — ABNORMAL LOW (ref 39.0–52.0)
Hemoglobin: 12 g/dL — ABNORMAL LOW (ref 13.0–17.0)
Immature Granulocytes: 0 %
Lymphocytes Relative: 22 %
Lymphs Abs: 1.1 10*3/uL (ref 0.7–4.0)
MCH: 33.9 pg (ref 26.0–34.0)
MCHC: 34.2 g/dL (ref 30.0–36.0)
MCV: 99.2 fL (ref 80.0–100.0)
Monocytes Absolute: 0.5 10*3/uL (ref 0.1–1.0)
Monocytes Relative: 11 %
Neutro Abs: 3 10*3/uL (ref 1.7–7.7)
Neutrophils Relative %: 63 %
Platelet Count: 81 10*3/uL — ABNORMAL LOW (ref 150–400)
RBC: 3.54 MIL/uL — ABNORMAL LOW (ref 4.22–5.81)
RDW: 15.1 % (ref 11.5–15.5)
WBC Count: 4.7 10*3/uL (ref 4.0–10.5)
nRBC: 0 % (ref 0.0–0.2)

## 2021-06-25 LAB — AMMONIA: Ammonia: 151 umol/L — ABNORMAL HIGH (ref 9–35)

## 2021-06-25 LAB — CMP (CANCER CENTER ONLY)
ALT: 23 U/L (ref 0–44)
AST: 36 U/L (ref 15–41)
Albumin: 2.8 g/dL — ABNORMAL LOW (ref 3.5–5.0)
Alkaline Phosphatase: 80 U/L (ref 38–126)
Anion gap: 9 (ref 5–15)
BUN: 18 mg/dL (ref 8–23)
CO2: 20 mmol/L — ABNORMAL LOW (ref 22–32)
Calcium: 9.3 mg/dL (ref 8.9–10.3)
Chloride: 116 mmol/L — ABNORMAL HIGH (ref 98–111)
Creatinine: 0.8 mg/dL (ref 0.61–1.24)
GFR, Estimated: >60 mL/min (ref >60)
Glucose, Bld: 111 mg/dL — ABNORMAL HIGH (ref 70–99)
Potassium: 3.6 mmol/L (ref 3.5–5.1)
Sodium: 145 mmol/L (ref 135–145)
Total Bilirubin: 2.6 mg/dL — ABNORMAL HIGH (ref 0.3–1.2)
Total Protein: 5.9 g/dL — ABNORMAL LOW (ref 6.5–8.1)

## 2021-06-25 LAB — COMPREHENSIVE METABOLIC PANEL
ALT: 26 U/L (ref 0–44)
AST: 45 U/L — ABNORMAL HIGH (ref 15–41)
Albumin: 2.4 g/dL — ABNORMAL LOW (ref 3.5–5.0)
Alkaline Phosphatase: 81 U/L (ref 38–126)
Anion gap: 5 (ref 5–15)
BUN: 17 mg/dL (ref 8–23)
CO2: 20 mmol/L — ABNORMAL LOW (ref 22–32)
Calcium: 8.9 mg/dL (ref 8.9–10.3)
Chloride: 120 mmol/L — ABNORMAL HIGH (ref 98–111)
Creatinine, Ser: 0.72 mg/dL (ref 0.61–1.24)
GFR, Estimated: >60 mL/min (ref >60)
Glucose, Bld: 113 mg/dL — ABNORMAL HIGH (ref 70–99)
Potassium: 3.6 mmol/L (ref 3.5–5.1)
Sodium: 145 mmol/L (ref 135–145)
Total Bilirubin: 2.4 mg/dL — ABNORMAL HIGH (ref 0.3–1.2)
Total Protein: 5.4 g/dL — ABNORMAL LOW (ref 6.5–8.1)

## 2021-06-25 LAB — URINALYSIS, ROUTINE W REFLEX MICROSCOPIC
Bilirubin Urine: NEGATIVE
Glucose, UA: NEGATIVE mg/dL
Ketones, ur: NEGATIVE mg/dL
Leukocytes,Ua: NEGATIVE
Nitrite: NEGATIVE
Protein, ur: 30 mg/dL — AB
Specific Gravity, Urine: 1.025 (ref 1.005–1.030)
pH: 6 (ref 5.0–8.0)

## 2021-06-25 LAB — URINALYSIS, MICROSCOPIC (REFLEX)

## 2021-06-25 LAB — TROPONIN I (HIGH SENSITIVITY)
Troponin I (High Sensitivity): 11 ng/L (ref <18)
Troponin I (High Sensitivity): 10 ng/L (ref <18)

## 2021-06-25 LAB — GLUCOSE, CAPILLARY: Glucose-Capillary: 166 mg/dL — ABNORMAL HIGH (ref 70–99)

## 2021-06-25 LAB — LIPASE, BLOOD: Lipase: 45 U/L (ref 11–51)

## 2021-06-25 LAB — BRAIN NATRIURETIC PEPTIDE: B Natriuretic Peptide: 32.3 pg/mL (ref 0.0–100.0)

## 2021-06-25 LAB — SAVE SMEAR(SSMR), FOR PROVIDER SLIDE REVIEW

## 2021-06-25 MED ORDER — METOPROLOL TARTRATE 5 MG/5ML IV SOLN
5.0000 mg | Freq: Once | INTRAVENOUS | Status: AC
  Administered 2021-06-25: 5 mg via INTRAVENOUS
  Filled 2021-06-25: qty 5

## 2021-06-25 MED ORDER — SPIRONOLACTONE 12.5 MG HALF TABLET
12.5000 mg | ORAL_TABLET | Freq: Every day | ORAL | Status: DC
  Administered 2021-06-25 – 2021-06-26 (×2): 12.5 mg via ORAL
  Filled 2021-06-25 (×2): qty 1

## 2021-06-25 MED ORDER — PANTOPRAZOLE SODIUM 40 MG PO TBEC
40.0000 mg | DELAYED_RELEASE_TABLET | Freq: Every day | ORAL | Status: DC
  Administered 2021-06-25 – 2021-06-26 (×2): 40 mg via ORAL
  Filled 2021-06-25 (×2): qty 1

## 2021-06-25 MED ORDER — LACTULOSE 10 GM/15ML PO SOLN
20.0000 g | Freq: Once | ORAL | Status: AC
  Administered 2021-06-25: 20 g via ORAL
  Filled 2021-06-25: qty 30

## 2021-06-25 MED ORDER — ONDANSETRON HCL 4 MG PO TABS: 4.0000 mg | ORAL_TABLET | Freq: Four times a day (QID) | ORAL | Status: DC | PRN

## 2021-06-25 MED ORDER — MAGNESIUM SULFATE 2 GM/50ML IV SOLN
2.0000 g | Freq: Once | INTRAVENOUS | Status: AC
  Administered 2021-06-26: 2 g via INTRAVENOUS
  Filled 2021-06-25: qty 50

## 2021-06-25 MED ORDER — PROCHLORPERAZINE EDISYLATE 10 MG/2ML IJ SOLN: 5.0000 mg | INTRAMUSCULAR | Status: DC | PRN

## 2021-06-25 MED ORDER — ALBUMIN HUMAN 25 % IV SOLN
50.0000 g | Freq: Once | INTRAVENOUS | Status: AC
  Administered 2021-06-25: 50 g via INTRAVENOUS
  Filled 2021-06-25: qty 200

## 2021-06-25 MED ORDER — LACTULOSE 10 GM/15ML PO SOLN
30.0000 g | Freq: Three times a day (TID) | ORAL | Status: DC
  Administered 2021-06-25 – 2021-06-28 (×8): 30 g via ORAL
  Filled 2021-06-25 (×8): qty 45

## 2021-06-25 MED ORDER — HYDROCHLOROTHIAZIDE 12.5 MG PO TABS: 12.5000 mg | ORAL_TABLET | Freq: Every day | ORAL | Status: DC

## 2021-06-25 MED ORDER — ONDANSETRON HCL 4 MG/2ML IJ SOLN: 4.0000 mg | Freq: Four times a day (QID) | INTRAMUSCULAR | Status: DC | PRN

## 2021-06-25 MED ORDER — POTASSIUM CHLORIDE IN NACL 20-0.45 MEQ/L-% IV SOLN
INTRAVENOUS | Status: DC
  Filled 2021-06-25: qty 1000

## 2021-06-25 NOTE — Progress Notes (Signed)
   06/25/21 1407  Assess: MEWS Score  Temp 97.8 F (36.6 C)  BP (!) 166/87  MAP (mmHg) 108  Pulse Rate (!) 121  Resp 16  SpO2 100 %  O2 Device Room Air  Assess: MEWS Score  MEWS Temp 0  MEWS Systolic 0  MEWS Pulse 2  MEWS RR 0  MEWS LOC 0  MEWS Score 2  MEWS Score Color Yellow  Assess: if the MEWS score is Yellow or Red  Were vital signs taken at a resting state? Yes  Focused Assessment No change from prior assessment (first assessment)  Does the patient meet 2 or more of the SIRS criteria? No  MEWS guidelines implemented *See Row Information* Yes  Treat  MEWS Interventions Other (Comment) (paged admitting, because new patient)  Pain Scale PAINAD  Breathing 0  Negative Vocalization 0  Facial Expression 0  Body Language 0  Consolability 0  PAINAD Score 0  Take Vital Signs  Increase Vital Sign Frequency  Yellow: Q 2hr X 2 then Q 4hr X 2, if remains yellow, continue Q 4hrs  Escalate  MEWS: Escalate Yellow: discuss with charge nurse/RN and consider discussing with provider and RRT  Notify: Charge Nurse/RN  Name of Charge Nurse/RN Notified Sonne, RN  Notify: Provider  Provider Name/Title Dr. Olevia Bowens  Date Provider Notified 06/25/21  Time Provider Notified [phone] (Admitting notified)  Method of Notification Page  Notification Reason Other (Comment) (admitting and yellow mews)  Provider response En route  Date of Provider Response 06/25/21  Document  Patient Outcome Stabilized after interventions  Progress note created (see row info) Yes  Assess: SIRS CRITERIA  SIRS Temperature  0  SIRS Pulse 1  SIRS Respirations  0  SIRS WBC 0  SIRS Score Sum  1

## 2021-06-25 NOTE — ED Notes (Signed)
ED Provider at bedside. 

## 2021-06-25 NOTE — ED Notes (Signed)
Report given to Shanon Brow RN with [facility]

## 2021-06-25 NOTE — ED Provider Notes (Signed)
[facility] EMERGENCY DEPARTMENT Provider Note   CSN: [id number] Arrival date & time: 06/25/21  0818     History  Chief Complaint  Patient presents with   Altered Mental Status    Steve Watson is a 81 y.o. male.  Patient here with confusion.  Started yesterday morning around 8 AM after breakfast.  Wife states that he has been having increasing confusion and generalized weakness.  Patient has history of cirrhosis, leukopenia.  Follows with oncology for leukopenia.  He was seen by Dr. Marin Olp today who recommended he come to the emergency department for his confusion.  He denies any chest pain or abdominal pain.  No recent illness.  No fever.  No new medications or other significant drug or alcohol use.  Spouse denies any falls.  Nothing has made it worse or better.  He has had some increased swelling in his legs.  Denies any headache, numbness, tingling, speech changes, vision changes.  The history is provided by the patient and the spouse.      Home Medications Prior to Admission medications   Medication Sig Start Date End Date Taking? Authorizing Provider  aspirin 81 MG tablet Take 81 mg by mouth daily.    [provider]  calcium & magnesium carbonates (MYLANTA) 311-232 MG per tablet Take 1 tablet by mouth daily.    [provider]  Cholecalciferol 2000 UNITS TABS Take 1 tablet (2,000 Units total) by mouth daily at 12 noon. 06/04/14   Brunetta Jeans, PA-C  hydrochlorothiazide (MICROZIDE) 12.5 MG capsule Take 1 capsule (12.5 mg total) by mouth daily. 04/27/21   Saguier, Percell Miller, PA-C  Multiple Vitamins-Minerals (VISION VITAMINS) TABS Take 2 tablets by mouth daily.    [provider]  omeprazole (PRILOSEC) 10 MG capsule Take 10 mg by mouth as needed.    [provider]  Zinc 100 MG TABS Take by mouth.    [provider]      Allergies    Patient has no known allergies.    Review of Systems   Review of Systems  Physical  Exam Updated Vital Signs BP 126/63 (BP Location: Left Arm)   Pulse 91   Temp 97.9 F (36.6 C) (Oral)   Resp 18   Ht 5' 8"  (1.727 m)   Wt 86.2 kg   SpO2 100%   BMI 28.89 kg/m  Physical Exam Vitals and nursing note reviewed.  Constitutional:      General: He is not in acute distress.    Appearance: He is well-developed. He is ill-appearing.  HENT:     Head: Normocephalic and atraumatic.     Nose: Nose normal.     Mouth/Throat:     Mouth: Mucous membranes are moist.  Eyes:     Extraocular Movements: Extraocular movements intact.     Conjunctiva/sclera: Conjunctivae normal.     Pupils: Pupils are equal, round, and reactive to light.  Cardiovascular:     Rate and Rhythm: Normal rate and regular rhythm.     Pulses: Normal pulses.     Heart sounds: No murmur heard. Pulmonary:     Effort: Pulmonary effort is normal. No respiratory distress.     Breath sounds: Normal breath sounds.  Abdominal:     Palpations: Abdomen is soft.     Tenderness: There is no abdominal tenderness.  Musculoskeletal:        General: No swelling.     Cervical back: Neck supple.  Skin:    General: Skin  is warm and dry.     Capillary Refill: Capillary refill takes less than 2 seconds.  Neurological:     General: No focal deficit present.     Mental Status: He is alert.     Comments: Patient is confused but can follow commands, appears to have normal strength and sensation throughout, normal speech, slightly slow to respond to questions on exam, visual acuity is intact, no drift  Psychiatric:        Mood and Affect: Mood normal.    ED Results / Procedures / Treatments   Labs (all labs ordered are listed, but only abnormal results are displayed) Labs Reviewed  CBC WITH DIFFERENTIAL/PLATELET - Abnormal; Notable for the following components:      Result Value   RBC 3.40 (*)    Hemoglobin 11.7 (*)    HCT 32.9 (*)    MCH 34.4 (*)    Platelets 65 (*)    All other components within normal limits   COMPREHENSIVE METABOLIC PANEL - Abnormal; Notable for the following components:   Chloride 120 (*)    CO2 20 (*)    Glucose, Bld 113 (*)    Total Protein 5.4 (*)    Albumin 2.4 (*)    AST 45 (*)    Total Bilirubin 2.4 (*)    All other components within normal limits  AMMONIA - Abnormal; Notable for the following components:   Ammonia 151 (*)    All other components within normal limits  URINALYSIS, ROUTINE W REFLEX MICROSCOPIC - Abnormal; Notable for the following components:   Hgb urine dipstick MODERATE (*)    Protein, ur 30 (*)    All other components within normal limits  URINALYSIS, MICROSCOPIC (REFLEX) - Abnormal; Notable for the following components:   Bacteria, UA FEW (*)    All other components within normal limits  BRAIN NATRIURETIC PEPTIDE  LIPASE, BLOOD  PROTIME-INR  TROPONIN I (HIGH SENSITIVITY)  TROPONIN I (HIGH SENSITIVITY)    EKG EKG Interpretation  Date/Time:  Friday June 25 2021 08:32:07 EDT Ventricular Rate:  100 PR Interval:  170 QRS Duration: 110 QT Interval:  389 QTC Calculation: 502 R Axis:   26 Text Interpretation: Sinus tachycardia Prolonged QT interval Confirmed by Lennice Sites ([id number]) on 06/25/2021 9:08:53 AM  Radiology CT Head Wo Contrast  Result Date: 06/25/2021 CLINICAL DATA:  81 year old male with difficulty walking, weakness, confusion, jaundiced. EXAM: CT HEAD WITHOUT CONTRAST TECHNIQUE: Contiguous axial images were obtained from the base of the skull through the vertex without intravenous contrast. RADIATION DOSE REDUCTION: This exam was performed according to the departmental dose-optimization program which includes automated exposure control, adjustment of the mA and/or kV according to patient size and/or use of iterative reconstruction technique. COMPARISON:  Head CT 07/07/2013. FINDINGS: Brain: Some generalized cerebral volume loss since 2015. No midline shift, ventriculomegaly, mass effect, evidence of mass lesion, intracranial hemorrhage  or evidence of cortically based acute infarction. Patchy bilateral cerebral white matter hypodensity is increased and appears moderate for age. Deep gray nuclei, brainstem and cerebellum appear spared. No cortical encephalomalacia identified. Vascular: Calcified atherosclerosis at the skull base. No suspicious intracranial vascular hyperdensity. Skull: No acute or suspicious osseous lesion identified. Sinuses/Orbits: Visualized paranasal sinuses and mastoids are stable and well aerated. Other: Visualized orbits and scalp soft tissues are within normal limits. IMPRESSION: 1. No acute intracranial abnormality. 2. Generalized cerebral volume loss since 2015, and moderate for age cerebral white matter changes, most commonly due to chronic small vessel disease. Electronically  Signed   By: [name]   On: 06/25/2021 09:18   DG Chest Portable 1 View  Result Date: 06/25/2021 CLINICAL DATA:  Provided history: Altered mental status. Additional history provided: Difficulty walking, decreased strength, increased confusion. Jaundice. EXAM: PORTABLE CHEST 1 VIEW COMPARISON:  Prior chest radiographs 08/19/2020 and earlier. FINDINGS: Heart size within normal limits. Subtle ill-defined opacity within the bilateral left lung base, favored to reflect atelectasis. No definite airspace consolidation. No evidence of pleural effusion or pneumothorax. No acute bony abnormality identified. IMPRESSION: Subtle ill-defined opacity within the lateral left lung base, favored to reflect atelectasis. Otherwise, no evidence of acute cardiopulmonary abnormality. Electronically Signed   By: [name]   On: 06/25/2021 09:19    Procedures Procedures    Medications Ordered in ED Medications  lactulose (CHRONULAC) 10 GM/15ML solution 20 g (20 g Oral Given 06/25/21 1128)    ED Course/ Medical Decision Making/ A&P                           Medical Decision Making Amount and/or Complexity of Data Reviewed Labs:  ordered. Radiology: ordered.  Risk Prescription drug management. Decision regarding hospitalization.   Steve Watson is here with altered mental status.  Normal vitals.  No fever.  Confusion and generalized weakness since yesterday morning at 8 AM over 24 hours ago.  Dr. Marin Olp with oncology called me to tell me he is sending this patient over for weakness and confusion.  Has a history of possibly mild cirrhosis.  He follows with him for leukopenia.  His white count today was overall unremarkable.  Patient has not had any infectious symptoms.  No obvious stroke symptoms.  Wife states that he has slowly been confused since yesterday morning.  Denies any recent alcohol or drug use or medication changes.  Patient denies any chest pain, abdominal pain.  Neurologically he appears to be intact but he is confused and slightly slowed.  Denies any falls.  Differential is wide including infectious process versus neurologic process versus hepatic encephalopathy.  Overall he appears well.  We will get CBC, CMP, lipase, troponin, BNP, ammonia level, chest x-ray, urinalysis, CT scan of the head.  EKG per my review and interpretation shows sinus tachycardia.  No ischemic changes.  Ammonia level is 150.  Otherwise per my review and interpretation of labs there is no significant anemia or electrolyte abnormality.  White count and platelets are at baseline.  Troponin, BNP, lipase all normal.  Overall suspect hepatic encephalopathy causing his issues today.  He was found to have cirrhosis on MRI of his abdomen 2 years ago but not sure what this is from.  No heavy alcohol use or IV drug use.  Possibly fatty liver process.  We will get a new ultrasound of his gallbladder and liver but will admit him to medicine for further care.  Patient given lactulose.  This chart was dictated using voice recognition software.  Despite best efforts to proofread,  errors can occur which can change the documentation meaning.          Final Clinical Impression(s) / ED Diagnoses Final diagnoses:  Hepatic encephalopathy [facility])    Rx / DC Orders ED Discharge Orders     None         Lennice Sites, DO 06/25/21 1131

## 2021-06-25 NOTE — ED Notes (Addendum)
Remains confused , trying to get out of bed. Wife at bedside. NA at bedside for safety

## 2021-06-25 NOTE — ED Notes (Signed)
US at bedside

## 2021-06-25 NOTE — ED Notes (Signed)
Report given to Sonne RN, receiving nurse for [employer]

## 2021-06-25 NOTE — ED Triage Notes (Signed)
Pt arrives with wife who states he has been having trouble walking, decreased strength. Increased confusion over the past few days. A&O x 1. Jaundice noted.

## 2021-06-25 NOTE — Progress Notes (Signed)
Plan of Care Note for accepted transfer   Patient: Steve Watson MRN: [id number]   [place]: 06/25/2021  Facility requesting transfer: [facility] Requesting Provider: Lennice Sites, DO Reason for transfer: Hepatic encephalopathy. Facility course:  81 year old male with a past medical history of GERD, leukopenia, thrombocytopenia, history of alcoholic cirrhosis who was following today with Dr. Marin Olp, but was referred to the emergency department due to [place] in the setting of hepatic encephalopathy.  Lactulose 20 g p.o. were given.  Lab work:   Component Value Units  Troponin I (High Sensitivity) [[id number]]   Collected: 06/25/21 1110   Updated: 06/25/21 1151   Specimen Source: Vein    Troponin I (High Sensitivity) 10 ng/L  Protime-INR [[id number]] (Abnormal)   Collected: 06/25/21 1123   Updated: 06/25/21 1150   Specimen Type: Blood   Specimen Source: Vein    Prothrombin Time 17.4 High  seconds   INR 1.4 High   Urinalysis, Microscopic (reflex) [[id number]] (Abnormal)   Collected: 06/25/21 1043   Updated: 06/25/21 1114    RBC / HPF 21-50 RBC/hpf   WBC, UA 11-20 WBC/hpf   Bacteria, UA FEW Abnormal    Squamous Epithelial / LPF 0-5   Ca Oxalate Crys, UA PRESENT  Urinalysis, Routine w reflex microscopic Urine, Clean Catch [[id number]] (Abnormal)   Collected: 06/25/21 1043   Updated: 06/25/21 1114   Specimen Source: Urine, Clean Catch    Color, Urine YELLOW   APPearance CLEAR   Specific Gravity, Urine 1.025   pH 6.0   Glucose, UA NEGATIVE mg/dL   Hgb urine dipstick MODERATE Abnormal    Bilirubin Urine NEGATIVE   Ketones, ur NEGATIVE mg/dL   Protein, ur 30 Abnormal  mg/dL   Nitrite NEGATIVE   Leukocytes,Ua NEGATIVE  CBC with Differential [[id number]] (Abnormal)   Collected: 06/25/21 0925   Updated: 06/25/21 1016   Specimen Type: Blood   Specimen Source: Vein    WBC 4.2 K/uL   RBC 3.40 Low  MIL/uL   Hemoglobin 11.7 Low  g/dL   HCT 32.9 Low  %   MCV 96.8 fL   MCH  34.4 High  pg   MCHC 35.6 g/dL   RDW 15.5 %   Platelets 65 Low  K/uL   nRBC 0.0 %   Neutrophils Relative % 70 %   Neutro Abs 3.0 K/uL   Lymphocytes Relative 16 %   Lymphs Abs 0.7 K/uL   Monocytes Relative 11 %   Monocytes Absolute 0.5 K/uL   Eosinophils Relative 2 %   Eosinophils Absolute 0.1 K/uL   Basophils Relative 1 %   Basophils Absolute 0.0 K/uL   Immature Granulocytes 0 %   Abs Immature Granulocytes 0.01 K/uL  Troponin I (High Sensitivity) [[id number]]   Collected: 06/25/21 0925   Updated: 06/25/21 1010   Specimen Source: Vein    Troponin I (High Sensitivity) 11 ng/L  Brain natriuretic peptide [[id number]]   Collected: 06/25/21 0925   Updated: 06/25/21 1010   Specimen Type: Blood   Specimen Source: Vein    B Natriuretic Peptide 32.3 pg/mL  Ammonia [[id number]] (Abnormal)   Collected: 06/25/21 0925   Updated: 06/25/21 1002   Specimen Type: Blood   Specimen Source: Vein    Ammonia 151 High  umol/L  Comprehensive metabolic panel [[id number]] (Abnormal)   Collected: 06/25/21 0925   Updated: 06/25/21 1001   Specimen Type: Blood   Specimen Source: Vein    Sodium 145 mmol/L   Potassium 3.6 mmol/L   Chloride 120 High  mmol/L   CO2 20 Low  mmol/L   Glucose, Bld 113 High  mg/dL   BUN 17 mg/dL   Creatinine, Ser 0.72 mg/dL   Calcium 8.9 mg/dL   Total Protein 5.4 Low  g/dL   Albumin 2.4 Low  g/dL   AST 45 High  U/L   ALT 26 U/L   Alkaline Phosphatase 81 U/L   Total Bilirubin 2.4 High  mg/dL   GFR, Estimated >60 mL/min   Anion gap 5  Lipase, blood [[id number]]   Collected: 06/25/21 0925   Updated: 06/25/21 1001   Specimen Type: Blood   Specimen Source: Vein    Lipase 45 U/L   Imaging: Noncontrast CT head with no acute intracranial normality.  Portable 1 view chest radiograph with ill-defined opacity in the lateral left lung base likely to be atelectasis, otherwise no acute cardiopulmonary abnormality.  Plan of care: The patient is accepted for admission to  Telemetry unit, at [facility].  Author: Reubin Milan, MD 06/25/2021  Check [email or web address] for on-call coverage.  Nursing staff, Please call [place] number on Amion as soon as patient's arrival, so appropriate admitting provider can evaluate the pt.

## 2021-06-25 NOTE — H&P (Signed)
History and Physical    Patient: Steve Watson HMC:[id number] DOB: 05/12/1940 DOA: 06/25/2021 DOS: the patient was seen and examined on 06/25/2021 PCP: Mackie Pai, PA-C  Patient coming from: Home  Chief Complaint:  Chief Complaint  Patient presents with   Altered Mental Status   HPI: Steve Watson is a 81 y.o. male with medical history significant of varicella-zoster, GERD, leukopenia, type 2 diabetes, hypertension, known alcoholic liver cirrhosis who is coming from [facility] where he was referred by Dr. Marin Olp due to altered mental status for the past few days associated with weakness and hypersomnolence. No fever, chills or night sweats at home but he has had increased pain of the area.  No sore throat, rhinorrhea, dyspnea, wheezing or hemoptysis.  No chest pain, palpitations, diaphoresis, PND, orthopnea, but in recent weeks has developed significant pitting edema of the lower extremities.  No appetite changes (wife says that his appetite is really good), abdominal pain, diarrhea, constipation, melena or hematochezia.  No flank pain, dysuria, frequency or hematuria.  No polyuria, polydipsia, polyphagia or blurred vision.  His wife and family were little surprised about the cirrhosis diagnosis.  They stated that he was not a drinker and no history of hepatitis in the past.  However, the patient has to being obese or overweight most of his adult life.  ED course: Initial vital signs were temperature 97.9 F, pulse 71, respiration 20, BP 147/82 mmHg O2 sat 100% on room air.  The patient received 20 g of lactulose in the emergency department.  Lab work: CBC showed a white count 4.2, hemoglobin 11.7 g/dL platelets 65.  Ammonia level was 151 mol/L.  CMP shows sodium 145, potassium 3.6, chloride 120 and CO2 20 mmol/L.  Renal function and calcium were normal.  Glucose 113 and total bilirubin 2.4 mg/dL.  Total protein is 5.4 and albumin 2.4 g/dL.  AST was 45, ALT 26 and alkaline  phosphatase 81.  BNP was 32.3 pg/mL.  Troponin x2 was normal.  Imaging: RUQ ultrasound showed mildly nodular liver contour and heterogenous, coarsened liver echogenicity no definite liver lesion identified.  Gallbladder appears normal.  CT head with no acute intracranial normality.  Chest radiograph shows a subtle ill-defined opacity within the lateral left lung base, likely to be atelectasis.  Review of Systems: As mentioned in the history of present illness. All other systems reviewed and are negative. 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 He has never used smokeless tobacco. He reports that he does not drink alcohol and does not use drugs.  No Known Allergies  Family History  Problem Relation Age of Onset   Hypertension Mother    Hypertension Father    Hypertension Brother    Diabetes Brother    Healthy Child    Healthy Grandchild     Prior to Admission medications   Medication Sig Start Date End Date Taking? Authorizing Provider  aspirin 81 MG tablet Take 81 mg by mouth daily.    [provider]  calcium & magnesium carbonates (MYLANTA) 311-232 MG per tablet Take 1 tablet by mouth daily.    [provider]  Cholecalciferol 2000 UNITS TABS Take 1 tablet (2,000 Units total) by mouth daily at 12 noon.  06/04/14   Brunetta Jeans, PA-C  hydrochlorothiazide (MICROZIDE) 12.5 MG capsule Take 1 capsule (12.5 mg total) by mouth daily. 04/27/21   Saguier, Percell Miller, PA-C  Multiple Vitamins-Minerals (VISION VITAMINS) TABS Take 2 tablets by mouth daily.    [provider]  omeprazole (PRILOSEC) 10 MG capsule Take 10 mg by mouth as needed.    [provider]  Zinc 100 MG TABS Take by mouth.    [provider]    Physical Exam: Vitals:   06/25/21 1113 06/25/21 1302 06/25/21 1315 06/25/21 1407  BP: 126/63  139/75 (!) 166/87  Pulse: 91  88 (!) 121  Resp: 18  16 16   Temp:  98.3 F (36.8 C) 98.3 F (36.8 C) 97.8 F (36.6 C)  TempSrc:  Axillary Oral Oral  SpO2: 100%  100% 100%  Weight:      Height:       Physical Exam Vitals reviewed.  Constitutional:      Appearance: He is overweight. He is ill-appearing.     Comments: Somnolent.  HENT:     Head: Normocephalic.     Mouth/Throat:     Mouth: Mucous membranes are moist.  Eyes:     General: Scleral icterus present.     Pupils: Pupils are equal, round, and reactive to light.  Cardiovascular:     Rate and Rhythm: Regular rhythm. Tachycardia present.     Heart sounds: S1 normal and S2 normal.  Pulmonary:     Breath sounds: Examination of the right-lower field reveals decreased breath sounds. Examination of the left-lower field reveals decreased breath sounds. Decreased breath sounds present. No wheezing, rhonchi or rales.  Abdominal:     General: Bowel sounds are normal. There is no distension.     Palpations: Abdomen is soft.     Tenderness: There is no abdominal tenderness. There is no right CVA tenderness, left CVA tenderness or guarding.  Musculoskeletal:     Right lower leg: 3+ Edema present.     Left lower leg: 3+ Edema present.  Skin:    General: Skin is warm and dry.     Coloration: Skin is jaundiced.  Neurological:     Mental Status: He is easily aroused.    Data Reviewed:  There are no new results to review at this time.  Assessment and Plan: Principal Problem:   Hepatic encephalopathy ([place]) In the setting of:   Non-alcoholic micronodular cirrhosis of liver (HCC) Observation/telemetry. Continue lactulose 30 g p.o. 3 times daily. Albumin 50 g IVPB x1 dose. Begin spironolactone 12.5 mg p.o. daily. Resume HCTZ 12.5 mg p.o. daily. Will screen for hepatitis. Check ammonia, CBC and CMP in a.m.  Active  Problems:   Gastroesophageal reflux  disease without esophagitis Continue daily proton pump inhibitor.    Thrombocytopenia (HCC)   Normocytic anemia   Leukopenia Monitor CBC. Follow-up with hematology as scheduled.    Type 2 diabetes mellitus (HCC) Normal hemoglobin A1c in March. Carbohydrate modified diet. CBG monitoring before meals at bedtime.    Hypertension Metoprolol 5 mg IVP x1 dose. Resume HCTZ 12.5 mg daily in AM. Monitor BP, HR, renal function/electrolytes.    Prolonged QT interval Was tachycardic as high as 120 earlier. Optimize electrolytes. Metoprolol IVP x1 dose. Avoid QT prolonging meds.     Advance Care Planning:   Code Status: Full Code   Consults:   Family Communication: Spoke to wife.  Severity of Illness: The appropriate patient status for this patient is OBSERVATION. Observation status  is judged to be reasonable and necessary in order to provide the required intensity of service to ensure the patient's safety. The patient's presenting symptoms, physical exam findings, and initial radiographic and laboratory data in the context of their medical condition is felt to place them at decreased risk for further clinical deterioration. Furthermore, it is anticipated that the patient will be medically stable for discharge from the hospital within 2 midnights of admission.   Author: Reubin Milan, MD 06/25/2021 2:18 PM  For on call review [email or web address].   This document was prepared using Dragon voice recognition software and may contain some unintended transcription errors.

## 2021-06-25 NOTE — Progress Notes (Signed)
Hematology and Oncology Follow Up Visit  Steve Watson [id number] [birth year] 81 y.o. 06/25/2021   Principle Diagnosis:  Leukopenia and Thrombocytopenia - cirrhosis by ultrasound imaging  Current Therapy:   Observation     Interim History:  Steve Watson is back for follow-up.  We see him every 6 months.  Unfortunate, he is not doing well at all.  He comes in a wheelchair.  His wife is with him.  He has always come by himself only last have seen him.  He has been walking in.  He has always been very talkative.  Today, he does not know where he is.  He does not know the date.  He does not know his age.  His wife said he began to get worse yesterday.  He is not unable to really walk.  He has had some confusion.  There is no diarrhea.  He has had no fever.  He has had no cough.  There is been no pain.  There is been no change in bowel or bladder habits.  He does have cirrhosis.  I just wonder if he does not have hepatic encephalopathy right now.  There has been no nausea or vomiting.  He has chronic leg swelling.  We will have to get him down to the emergency room.  I spoke to the ER doc about this.  Overall, I would have said his performance status today is ECOG 3.     Medications:  Current Outpatient Medications:    aspirin 81 MG tablet, Take 81 mg by mouth daily., Disp: , Rfl:    calcium & magnesium carbonates (MYLANTA) 311-232 MG per tablet, Take 1 tablet by mouth daily., Disp: , Rfl:    Cholecalciferol 2000 UNITS TABS, Take 1 tablet (2,000 Units total) by mouth daily at 12 noon., Disp: 30 each, Rfl: 2   hydrochlorothiazide (MICROZIDE) 12.5 MG capsule, Take 1 capsule (12.5 mg total) by mouth daily., Disp: 90 capsule, Rfl: 1   Multiple Vitamins-Minerals (VISION VITAMINS) TABS, Take 2 tablets by mouth daily., Disp: , Rfl:    omeprazole (PRILOSEC) 10 MG capsule, Take 10 mg by mouth as needed., Disp: , Rfl:    Zinc 100 MG TABS, Take by mouth., Disp: , Rfl:   Allergies: No Known  Allergies  Past Medical History, Surgical history, Social history, and Family History were reviewed and updated.  Review of Systems: Review of Systems  Constitutional: Negative.   HENT: Negative.    Eyes: Negative.   Respiratory: Negative.    Cardiovascular: Negative.   Gastrointestinal: Negative.   Genitourinary: Negative.   Musculoskeletal: Negative.   Skin: Negative.   Neurological: Negative.   Endo/Heme/Allergies: Negative.   Psychiatric/Behavioral: Negative.      Physical Exam:  oral temperature is 98 F (36.7 C). His blood pressure is 154/68 (abnormal) and his pulse is 95. His respiration is 17 and oxygen saturation is 100%.   Wt Readings from Last 3 Encounters:  04/27/21 193 lb 6.4 oz (87.7 kg)  04/12/21 190 lb 9.6 oz (86.5 kg)  04/09/21 188 lb 15 oz (85.7 kg)     Physical Exam Vitals reviewed.  HENT:     Head: Normocephalic and atraumatic.  Eyes:     Pupils: Pupils are equal, round, and reactive to light.  Cardiovascular:     Rate and Rhythm: Normal rate and regular rhythm.     Heart sounds: Normal heart sounds.  Pulmonary:     Effort: Pulmonary effort is normal.  Breath sounds: Normal breath sounds.  Abdominal:     General: Bowel sounds are normal.     Palpations: Abdomen is soft.  Musculoskeletal:        General: No tenderness or deformity. Normal range of motion.     Cervical back: Normal range of motion.  Lymphadenopathy:     Cervical: No cervical adenopathy.  Skin:    General: Skin is warm and dry.     Findings: No erythema or rash.  Neurological:     Mental Status: He is alert and oriented to person, place, and time.  Psychiatric:        Behavior: Behavior normal.        Thought Content: Thought content normal.        Judgment: Judgment normal.     Lab Results  Component Value Date   WBC 4.7 06/25/2021   HGB 12.0 (L) 06/25/2021   HCT 35.1 (L) 06/25/2021   MCV 99.2 06/25/2021   PLT 81 (L) 06/25/2021     Chemistry      Component  Value Date/Time   NA 142 04/12/2021 1130   NA 138 07/13/2020 1322   K 3.5 04/12/2021 1130   CL 111 04/12/2021 1130   CO2 26 04/12/2021 1130   BUN 12 04/12/2021 1130   BUN 18 07/13/2020 1322   CREATININE 0.69 04/12/2021 1130   CREATININE 0.85 12/25/2020 0749      Component Value Date/Time   CALCIUM 8.1 (L) 04/12/2021 1130   ALKPHOS 91 04/12/2021 1130   AST 33 04/12/2021 1130   AST 31 12/25/2020 0749   ALT 22 04/12/2021 1130   ALT 21 12/25/2020 0749   BILITOT 1.8 (H) 04/12/2021 1130   BILITOT 2.7 (H) 12/25/2020 0749       Impression and Plan: Steve Watson is a 80 year old white male.  We have been following him now for several years.  Today, is the worst I have ever seen him.  Again, when he comes in he walks him and he is very alert and oriented.  Again I know he has the cirrhosis.  He may have a hepatic encephalopathy.  His labs look okay.  I looked at his blood under the microscope.  I do not see anything that looked suspicious.  I hope that what ever is going on will resolve.  I do not think this is a CNS event.  I do not think this is cerebrovascular.  Neurologically, I do not see anything that is focal.  Again he just has the change in cognition.    Hopefully, things will improve for him.  For right now, we will have to plan to probably get him back to see [place] in about 3 to 4 months.  If he goes into the hospital, I am sure that we will see him there.     Volanda Napoleon, MD 6/2/20238:09 AM

## 2021-06-26 DIAGNOSIS — D649 Anemia, unspecified: Secondary | ICD-10-CM

## 2021-06-26 DIAGNOSIS — I10 Essential (primary) hypertension: Secondary | ICD-10-CM | POA: Diagnosis not present

## 2021-06-26 DIAGNOSIS — Z833 Family history of diabetes mellitus: Secondary | ICD-10-CM | POA: Diagnosis not present

## 2021-06-26 DIAGNOSIS — K7460 Unspecified cirrhosis of liver: Secondary | ICD-10-CM | POA: Diagnosis not present

## 2021-06-26 DIAGNOSIS — K7581 Nonalcoholic steatohepatitis (NASH): Secondary | ICD-10-CM | POA: Diagnosis not present

## 2021-06-26 DIAGNOSIS — E119 Type 2 diabetes mellitus without complications: Secondary | ICD-10-CM | POA: Diagnosis not present

## 2021-06-26 DIAGNOSIS — K219 Gastro-esophageal reflux disease without esophagitis: Secondary | ICD-10-CM | POA: Diagnosis not present

## 2021-06-26 DIAGNOSIS — E8809 Other disorders of plasma-protein metabolism, not elsewhere classified: Secondary | ICD-10-CM | POA: Diagnosis not present

## 2021-06-26 DIAGNOSIS — D689 Coagulation defect, unspecified: Secondary | ICD-10-CM | POA: Diagnosis not present

## 2021-06-26 DIAGNOSIS — R600 Localized edema: Secondary | ICD-10-CM | POA: Diagnosis not present

## 2021-06-26 DIAGNOSIS — Z8249 Family history of ischemic heart disease and other diseases of the circulatory system: Secondary | ICD-10-CM | POA: Diagnosis not present

## 2021-06-26 DIAGNOSIS — D700 Congenital agranulocytosis: Secondary | ICD-10-CM

## 2021-06-26 DIAGNOSIS — Z7982 Long term (current) use of aspirin: Secondary | ICD-10-CM | POA: Diagnosis not present

## 2021-06-26 DIAGNOSIS — R8281 Pyuria: Secondary | ICD-10-CM | POA: Diagnosis not present

## 2021-06-26 DIAGNOSIS — E876 Hypokalemia: Secondary | ICD-10-CM | POA: Diagnosis not present

## 2021-06-26 DIAGNOSIS — K7682 Hepatic encephalopathy: Secondary | ICD-10-CM | POA: Diagnosis not present

## 2021-06-26 DIAGNOSIS — K269 Duodenal ulcer, unspecified as acute or chronic, without hemorrhage or perforation: Secondary | ICD-10-CM | POA: Diagnosis not present

## 2021-06-26 DIAGNOSIS — R161 Splenomegaly, not elsewhere classified: Secondary | ICD-10-CM | POA: Diagnosis not present

## 2021-06-26 DIAGNOSIS — E663 Overweight: Secondary | ICD-10-CM | POA: Diagnosis not present

## 2021-06-26 DIAGNOSIS — K766 Portal hypertension: Secondary | ICD-10-CM | POA: Diagnosis not present

## 2021-06-26 DIAGNOSIS — Z79899 Other long term (current) drug therapy: Secondary | ICD-10-CM | POA: Diagnosis not present

## 2021-06-26 DIAGNOSIS — K2970 Gastritis, unspecified, without bleeding: Secondary | ICD-10-CM | POA: Diagnosis not present

## 2021-06-26 DIAGNOSIS — K7469 Other cirrhosis of liver: Secondary | ICD-10-CM | POA: Diagnosis not present

## 2021-06-26 DIAGNOSIS — I8510 Secondary esophageal varices without bleeding: Secondary | ICD-10-CM | POA: Diagnosis not present

## 2021-06-26 DIAGNOSIS — D696 Thrombocytopenia, unspecified: Secondary | ICD-10-CM | POA: Diagnosis not present

## 2021-06-26 DIAGNOSIS — D72829 Elevated white blood cell count, unspecified: Secondary | ICD-10-CM | POA: Diagnosis not present

## 2021-06-26 DIAGNOSIS — E872 Acidosis, unspecified: Secondary | ICD-10-CM | POA: Diagnosis not present

## 2021-06-26 DIAGNOSIS — G4710 Hypersomnia, unspecified: Secondary | ICD-10-CM | POA: Diagnosis not present

## 2021-06-26 DIAGNOSIS — D539 Nutritional anemia, unspecified: Secondary | ICD-10-CM | POA: Diagnosis not present

## 2021-06-26 LAB — GLUCOSE, CAPILLARY
Glucose-Capillary: 82 mg/dL (ref 70–99)
Glucose-Capillary: 127 mg/dL — ABNORMAL HIGH (ref 70–99)
Glucose-Capillary: 102 mg/dL — ABNORMAL HIGH (ref 70–99)

## 2021-06-26 LAB — COMPREHENSIVE METABOLIC PANEL
ALT: 21 U/L (ref 0–44)
AST: 33 U/L (ref 15–41)
Albumin: 2.6 g/dL — ABNORMAL LOW (ref 3.5–5.0)
Alkaline Phosphatase: 62 U/L (ref 38–126)
Anion gap: 3 — ABNORMAL LOW (ref 5–15)
BUN: 16 mg/dL (ref 8–23)
CO2: 20 mmol/L — ABNORMAL LOW (ref 22–32)
Calcium: 8.1 mg/dL — ABNORMAL LOW (ref 8.9–10.3)
Chloride: 119 mmol/L — ABNORMAL HIGH (ref 98–111)
Creatinine, Ser: 0.7 mg/dL (ref 0.61–1.24)
GFR, Estimated: >60 mL/min (ref >60)
Glucose, Bld: 89 mg/dL (ref 70–99)
Potassium: 3.3 mmol/L — ABNORMAL LOW (ref 3.5–5.1)
Sodium: 142 mmol/L (ref 135–145)
Total Bilirubin: 2.9 mg/dL — ABNORMAL HIGH (ref 0.3–1.2)
Total Protein: 4.8 g/dL — ABNORMAL LOW (ref 6.5–8.1)

## 2021-06-26 LAB — CBC
HCT: 29.3 % — ABNORMAL LOW (ref 39.0–52.0)
Hemoglobin: 9.7 g/dL — ABNORMAL LOW (ref 13.0–17.0)
MCH: 33.8 pg (ref 26.0–34.0)
MCHC: 33.1 g/dL (ref 30.0–36.0)
MCV: 102.1 fL — ABNORMAL HIGH (ref 80.0–100.0)
Platelets: 52 10*3/uL — ABNORMAL LOW (ref 150–400)
RBC: 2.87 MIL/uL — ABNORMAL LOW (ref 4.22–5.81)
RDW: 15.5 % (ref 11.5–15.5)
WBC: 2.4 10*3/uL — ABNORMAL LOW (ref 4.0–10.5)
nRBC: 0 % (ref 0.0–0.2)

## 2021-06-26 LAB — IRON AND TIBC
Iron: 138 ug/dL (ref 45–182)
Saturation Ratios: 66 % — ABNORMAL HIGH (ref 17.9–39.5)
TIBC: 208 ug/dL — ABNORMAL LOW (ref 250–450)
UIBC: 70 ug/dL

## 2021-06-26 LAB — HEPATITIS PANEL, ACUTE
HCV Ab: NONREACTIVE
Hep A IgM: NONREACTIVE
Hep B C IgM: NONREACTIVE
Hepatitis B Surface Ag: NONREACTIVE

## 2021-06-26 LAB — HEPATIC FUNCTION PANEL
ALT: 22 U/L (ref 0–44)
AST: 34 U/L (ref 15–41)
Albumin: 2.8 g/dL — ABNORMAL LOW (ref 3.5–5.0)
Alkaline Phosphatase: 66 U/L (ref 38–126)
Bilirubin, Direct: 0.5 mg/dL — ABNORMAL HIGH (ref 0.0–0.2)
Indirect Bilirubin: 1.3 mg/dL — ABNORMAL HIGH (ref 0.3–0.9)
Total Bilirubin: 1.8 mg/dL — ABNORMAL HIGH (ref 0.3–1.2)
Total Protein: 5.1 g/dL — ABNORMAL LOW (ref 6.5–8.1)

## 2021-06-26 LAB — VITAMIN B12: Vitamin B-12: 395 pg/mL (ref 180–914)

## 2021-06-26 LAB — RETICULOCYTES
Immature Retic Fract: 18 % — ABNORMAL HIGH (ref 2.3–15.9)
RBC.: 2.81 MIL/uL — ABNORMAL LOW (ref 4.22–5.81)
Retic Count, Absolute: 70.8 10*3/uL (ref 19.0–186.0)
Retic Ct Pct: 2.5 % (ref 0.4–3.1)

## 2021-06-26 LAB — FOLATE: Folate: 12.6 ng/mL (ref >5.9)

## 2021-06-26 LAB — FERRITIN: Ferritin: 28 ng/mL (ref 24–336)

## 2021-06-26 LAB — AMMONIA: Ammonia: 104 umol/L — ABNORMAL HIGH (ref 9–35)

## 2021-06-26 MED ORDER — POTASSIUM CHLORIDE CRYS ER 20 MEQ PO TBCR
40.0000 meq | EXTENDED_RELEASE_TABLET | ORAL | Status: AC
  Administered 2021-06-26 (×2): 40 meq via ORAL
  Filled 2021-06-26 (×2): qty 2

## 2021-06-26 MED ORDER — CEFTRIAXONE SODIUM 2 G IJ SOLR
2.0000 g | INTRAMUSCULAR | Status: DC
  Administered 2021-06-26 – 2021-06-27 (×2): 2 g via INTRAVENOUS
  Filled 2021-06-26 (×2): qty 20

## 2021-06-26 MED ORDER — SPIRONOLACTONE 25 MG PO TABS
50.0000 mg | ORAL_TABLET | Freq: Every day | ORAL | Status: DC
  Administered 2021-06-28: 50 mg via ORAL
  Filled 2021-06-26 (×2): qty 2

## 2021-06-26 NOTE — Consult Note (Addendum)
Consultation  Referring Provider:    Niel Hummer  Primary Care Physician:  Elise Benne Primary Gastroenterologist:      Althia Forts   Reason for Consultation:    Hepatic encephalopathy, presumed [name] cirrhosis         HPI:   Steve Watson is a 81 y.o. male with with a history of GERD, leukopenia, thrombocytopenia, HTN, admitted with altered mental status, weakness, hypersomnolence.  He presented to Dr. Antonieta Pert office yesterday for his routine follow-up appointment and was noted to have AMS and confusion, and was transported from [place] [facility] to [facility] last evening.  Per review of notes, symptoms have been ongoing for the last several days.  No prior known history of known hepatic encephalopathy.  History supplemented by spouse at bedside today.  She reports confusion started about 3 days ago, and was most pronounced yesterday during appoint with Dr. Marin Olp.  She does note that he has had episodes of mild confusion in the past, but does events were brief, mild, and self-limiting by her recollection.  Otherwise no overt bleeding, nausea, vomiting, CP, SOB.  No falls.  He has never had any alcohol or tobacco use.  No known family history of liver disease or GI malignancy.  She reports his mentation has improved this morning.  Was able to feed himself breakfast without issue.  Admission evaluation notable for the following: - WBC 4.2, H/H11.7/33, PLT 65 (all largely stable/chronic) - AST/ALT 45/26 - T. bili 2.4 (T. bili has ranged 1.3-2.2 since 2019.  No previous fractionization) - Normal renal function - Ammonia 151 - Troponin normal x2, BNP normal - Lipase normal - PT/INR 17.4/1.4 (stable, chronic) - RUQ [place]: Mildly nodular liver contour with coarse echotexture.  Normal GB - CT head: No acute intracranial pathology   Reviewed previous imaging studies to include RUQ [place] x2 and MR abdomen x1 which demonstrate coarse liver echotexture and hepatic steatosis but  no duct dilation, ascites.  Did have splenomegaly on imaging starting in 12/2017.  No previous EGD or colonoscopy.  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 Authorizing Provider  aspirin 81 MG tablet Take 81 mg by mouth daily.   Yes [provider]  Cholecalciferol 2000 UNITS TABS Take 1 tablet (2,000 Units total) by mouth daily at 12 noon. 06/04/14  Yes Brunetta Jeans, PA-C  hydrochlorothiazide (MICROZIDE) 12.5 MG capsule Take 1 capsule (12.5 mg total) by mouth daily. 04/27/21  Yes Saguier, Percell Miller, PA-C  Multiple Vitamins-Minerals (VISION VITAMINS) TABS Take 2 tablets by mouth daily.   Yes [provider]  omeprazole (PRILOSEC) 10 MG capsule Take 10 mg  by mouth daily as needed (heatrt burn).   Yes [provider]  Zinc 100 MG TABS Take 100 mg by mouth daily.   Yes [provider]    Current Facility-Administered Medications  Medication Dose Route Frequency Provider Last Rate Last Admin   cefTRIAXone (ROCEPHIN) 2 g in sodium chloride 0.9 % 100 mL IVPB  2 g Intravenous Q24H Regalado, Belkys A, MD 200 mL/hr at 06/26/21 0743 2 g at 06/26/21 0743   lactulose (CHRONULAC) 10 GM/15ML solution 30 g  30 g Oral TID Reubin Milan, MD   30 g at 06/26/21 0929   pantoprazole  (PROTONIX) EC tablet 40 mg  40 mg Oral Daily Reubin Milan, MD   40 mg at 06/26/21 0929   potassium chloride SA (KLOR-CON M) CR tablet 40 mEq  40 mEq Oral Q4H Regalado, Belkys A, MD   40 mEq at 06/26/21 0739   prochlorperazine (COMPAZINE) injection 5 mg  5 mg Intravenous Q4H PRN Reubin Milan, MD       spironolactone (ALDACTONE) tablet 12.5 mg  12.5 mg Oral Daily Reubin Milan, MD   12.5 mg at 06/26/21 9935    Allergies as of 06/25/2021   (No Known Allergies)     Review of Systems:    As per HPI, otherwise negative    Physical Exam:  Vital signs in last 24 hours: Temp:  [97.7 F (36.5 C)-98.4 F (36.9 C)] 98 F (36.7 C) (06/03 0600) Pulse Rate:  [85-121] 85 (06/03 0600) Resp:  [16-19] 16 (06/03 0600) BP: (112-166)/(59-88) 118/59 (06/03 0600) SpO2:  [100 %] 100 % (06/03 0600) Last BM Date : 06/25/21 General:   Pleasant male in NAD Eyes:   No icterus.   Conjunctiva pink. Lungs:  Respirations even and unlabored. Lungs clear to auscultation bilaterally.   No wheezes, crackles, or rhonchi.  Heart:  Regular rate and rhythm; no MRG Abdomen:  Soft, nondistended, nontender. Normal bowel sounds. No appreciable masses or hepatomegaly.  Extremities: 3+ edema bilateral LE Neurologic:  + [place]ix[place].  Alert and conversive.   LAB RESULTS: Recent Labs    06/25/21 0741 06/25/21 0925 06/26/21 0606  WBC 4.7 4.2 2.4*  HGB 12.0* 11.7* 9.7*  HCT 35.1* 32.9* 29.3*  PLT 81* 65* 52*   BMET Recent Labs    06/25/21 0741 06/25/21 0925 06/26/21 0606  NA 145 145 142  K 3.6 3.6 3.3*  CL 116* 120* 119*  CO2 20* 20* 20*  GLUCOSE 111* 113* 89  BUN 18 17 16   CREATININE 0.80 0.72 0.70  CALCIUM 9.3 8.9 8.1*   LFT Recent Labs    06/26/21 0606  PROT 4.8*  ALBUMIN 2.6*  AST 33  ALT 21  ALKPHOS 62  BILITOT 2.9*   PT/INR Recent Labs    06/25/21 1123  LABPROT 17.4*  INR 1.4*    STUDIES: CT Head Wo Contrast  Result Date: 06/25/2021 CLINICAL DATA:  80 year old  male with difficulty walking, weakness, confusion, jaundiced. EXAM: CT HEAD WITHOUT CONTRAST TECHNIQUE: Contiguous axial images were obtained from the base of the skull through the vertex without intravenous contrast. RADIATION DOSE REDUCTION: This exam was performed according to the departmental dose-optimization program which includes automated exposure control, adjustment of the mA and/or kV according to patient size and/or use of iterative reconstruction technique. COMPARISON:  Head CT 07/07/2013. FINDINGS: Brain: Some generalized cerebral volume loss since 2015. No midline shift, ventriculomegaly, mass effect, evidence of mass lesion, intracranial hemorrhage or evidence of cortically based  acute infarction. Patchy bilateral cerebral white matter hypodensity is increased and appears moderate for age. Deep gray nuclei, brainstem and cerebellum appear spared. No cortical encephalomalacia identified. Vascular: Calcified atherosclerosis at the skull base. No suspicious intracranial vascular hyperdensity. Skull: No acute or suspicious osseous lesion identified. Sinuses/Orbits: Visualized paranasal sinuses and mastoids are stable and well aerated. Other: Visualized orbits and scalp soft tissues are within normal limits. IMPRESSION: 1. No acute intracranial abnormality. 2. Generalized cerebral volume loss since 2015, and moderate for age cerebral white matter changes, most commonly due to chronic small vessel disease. Electronically Signed   By: [name]   On: 06/25/2021 09:18   DG Chest Portable 1 View  Result Date: 06/25/2021 CLINICAL DATA:  Provided history: Altered mental status. Additional history provided: Difficulty walking, decreased strength, increased confusion. Jaundice. EXAM: PORTABLE CHEST 1 VIEW COMPARISON:  Prior chest radiographs 08/19/2020 and earlier. FINDINGS: Heart size within normal limits. Subtle ill-defined opacity within the bilateral left lung base, favored to reflect atelectasis. No  definite airspace consolidation. No evidence of pleural effusion or pneumothorax. No acute bony abnormality identified. IMPRESSION: Subtle ill-defined opacity within the lateral left lung base, favored to reflect atelectasis. Otherwise, no evidence of acute cardiopulmonary abnormality. Electronically Signed   By: [name]   On: 06/25/2021 09:19   US Abdomen Limited RUQ (LIVER/GB)  Result Date: 06/25/2021 CLINICAL DATA:  Cirrhosis. EXAM: ULTRASOUND ABDOMEN LIMITED RIGHT UPPER QUADRANT COMPARISON:  Abdominal ultrasound 04/09/2019; MRI abdomen 05/05/2019 FINDINGS: Gallbladder: No gallstones or wall thickening visualized. No sonographic Murphy sign noted by sonographer. Common bile duct: Diameter: 2 mm, within normal limits. Liver: Mildly nodular liver contour. Heterogeneous, course hepatic echotexture. The patient was unable to lie in left lateral decubitus position, limiting visualization of the right and left hepatic lobes. Overlying bowel-gas and patient confusion also limited this examination. No definite focal liver lesion is visualized. Portal vein is patent on color Doppler imaging with normal direction of blood flow towards the liver. Other: None. IMPRESSION: 1. Normal appearance of the gallbladder. 2. Mildly nodular liver contour and heterogeneous, coarse liver echogenicity. No definite liver lesion is identified. Electronically Signed   By: [name]   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  Based on underlying comorbidities and  imaging, with suspect NASH as primary etiology for cirrhosis - Extended serologic work-up for concomitant liver disease - EGD for esophageal varices screening - Depending on EGD findings and extended serologic work-up, discussed possible liver biopsy and/or outpatient elastography - Bilirubin fractionization - Continue lactulose and titrate to goal 3-4 soft stools daily - Decision to add rifaximin pending response to lactulose monotherapy - MELD 14  6) Thrombocytopenia - PLTs largely stable.  Related to underlying splenic sequestration.  Evaluating for concomitant portal hypertension/cirrhosis as above - Follows with Dr. Marin Olp in the Hematology clinic  7) Lower extremity edema - Continue diuretics per primary Hospitalist service   The indications, risks, and benefits of EGD were explained to the patient and his spouse in detail. Risks include but are not limited to bleeding,  perforation, adverse reaction to medications, and cardiopulmonary compromise. Sequelae include but are not limited to the possibility of surgery, hospitalization, and mortality. The patient verbalized understanding and wished to proceed.     LOS: 0 days   Lavena Bullion  06/26/2021, 10:32 AM

## 2021-06-26 NOTE — H&P (View-Only) (Signed)
Consultation  Referring Provider:    Niel Hummer  Primary Care Physician:  Elise Benne Primary Gastroenterologist:      Althia Forts   Reason for Consultation:    Hepatic encephalopathy, presumed [name] cirrhosis         HPI:   Steve Watson is a 81 y.o. male with with a history of GERD, leukopenia, thrombocytopenia, HTN, admitted with altered mental status, weakness, hypersomnolence.  He presented to Dr. Antonieta Pert office yesterday for his routine follow-up appointment and was noted to have AMS and confusion, and was transported from [facility] to [facility] last evening.  Per review of notes, symptoms have been ongoing for the last several days.  No prior known history of known hepatic encephalopathy.  History supplemented by spouse at bedside today.  She reports confusion started about 3 days ago, and was most pronounced yesterday during appoint with Dr. Marin Olp.  She does note that he has had episodes of mild confusion in the past, but does events were brief, mild, and self-limiting by her recollection.  Otherwise no overt bleeding, nausea, vomiting, CP, SOB.  No falls.  He has never had any alcohol or tobacco use.  No known family history of liver disease or GI malignancy.  She reports his mentation has improved this morning.  Was able to feed himself breakfast without issue.  Admission evaluation notable for the following: - WBC 4.2, H/H11.7/33, PLT 65 (all largely stable/chronic) - AST/ALT 45/26 - T. bili 2.4 (T. bili has ranged 1.3-2.2 since 2019.  No previous fractionization) - Normal renal function - Ammonia 151 - Troponin normal x2, BNP normal - Lipase normal - PT/INR 17.4/1.4 (stable, chronic) - RUQ [place]: Mildly nodular liver contour with coarse echotexture.  Normal GB - CT head: No acute intracranial pathology   Reviewed previous imaging studies to include RUQ [place] x2 and MR abdomen x1 which demonstrate coarse liver echotexture and hepatic steatosis but  no duct dilation, ascites.  Did have splenomegaly on imaging starting in 12/2017.  No previous EGD or colonoscopy.  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 Authorizing Provider  aspirin 81 MG tablet Take 81 mg by mouth daily.   Yes [provider]  Cholecalciferol 2000 UNITS TABS Take 1 tablet (2,000 Units total) by mouth daily at 12 noon. 06/04/14  Yes Brunetta Jeans, PA-C  hydrochlorothiazide (MICROZIDE) 12.5 MG capsule Take 1 capsule (12.5 mg total) by mouth daily. 04/27/21  Yes Saguier, Percell Miller, PA-C  Multiple Vitamins-Minerals (VISION VITAMINS) TABS Take 2 tablets by mouth daily.   Yes [provider]  omeprazole (PRILOSEC) 10 MG capsule Take 10 mg  by mouth daily as needed (heatrt burn).   Yes [provider]  Zinc 100 MG TABS Take 100 mg by mouth daily.   Yes [provider]    Current Facility-Administered Medications  Medication Dose Route Frequency Provider Last Rate Last Admin   cefTRIAXone (ROCEPHIN) 2 g in sodium chloride 0.9 % 100 mL IVPB  2 g Intravenous Q24H Regalado, Belkys A, MD 200 mL/hr at 06/26/21 0743 2 g at 06/26/21 0743   lactulose (CHRONULAC) 10 GM/15ML solution 30 g  30 g Oral TID Reubin Milan, MD   30 g at 06/26/21 0929   pantoprazole  (PROTONIX) EC tablet 40 mg  40 mg Oral Daily Reubin Milan, MD   40 mg at 06/26/21 0929   potassium chloride SA (KLOR-CON M) CR tablet 40 mEq  40 mEq Oral Q4H Regalado, Belkys A, MD   40 mEq at 06/26/21 0739   prochlorperazine (COMPAZINE) injection 5 mg  5 mg Intravenous Q4H PRN Reubin Milan, MD       spironolactone (ALDACTONE) tablet 12.5 mg  12.5 mg Oral Daily Reubin Milan, MD   12.5 mg at 06/26/21 9480    Allergies as of 06/25/2021   (No Known Allergies)     Review of Systems:    As per HPI, otherwise negative    Physical Exam:  Vital signs in last 24 hours: Temp:  [97.7 F (36.5 C)-98.4 F (36.9 C)] 98 F (36.7 C) (06/03 0600) Pulse Rate:  [85-121] 85 (06/03 0600) Resp:  [16-19] 16 (06/03 0600) BP: (112-166)/(59-88) 118/59 (06/03 0600) SpO2:  [100 %] 100 % (06/03 0600) Last BM Date : 06/25/21 General:   Pleasant male in NAD Eyes:   No icterus.   Conjunctiva pink. Lungs:  Respirations even and unlabored. Lungs clear to auscultation bilaterally.   No wheezes, crackles, or rhonchi.  Heart:  Regular rate and rhythm; no MRG Abdomen:  Soft, nondistended, nontender. Normal bowel sounds. No appreciable masses or hepatomegaly.  Extremities: 3+ edema bilateral LE Neurologic:  + Asterixis.  Alert and conversive.   LAB RESULTS: Recent Labs    06/25/21 0741 06/25/21 0925 06/26/21 0606  WBC 4.7 4.2 2.4*  HGB 12.0* 11.7* 9.7*  HCT 35.1* 32.9* 29.3*  PLT 81* 65* 52*   BMET Recent Labs    06/25/21 0741 06/25/21 0925 06/26/21 0606  NA 145 145 142  K 3.6 3.6 3.3*  CL 116* 120* 119*  CO2 20* 20* 20*  GLUCOSE 111* 113* 89  BUN 18 17 16   CREATININE 0.80 0.72 0.70  CALCIUM 9.3 8.9 8.1*   LFT Recent Labs    06/26/21 0606  PROT 4.8*  ALBUMIN 2.6*  AST 33  ALT 21  ALKPHOS 62  BILITOT 2.9*   PT/INR Recent Labs    06/25/21 1123  LABPROT 17.4*  INR 1.4*    STUDIES: CT Head Wo Contrast  Result Date: 06/25/2021 CLINICAL DATA:  81 year old  male with difficulty walking, weakness, confusion, jaundiced. EXAM: CT HEAD WITHOUT CONTRAST TECHNIQUE: Contiguous axial images were obtained from the base of the skull through the vertex without intravenous contrast. RADIATION DOSE REDUCTION: This exam was performed according to the departmental dose-optimization program which includes automated exposure control, adjustment of the mA and/or kV according to patient size and/or use of iterative reconstruction technique. COMPARISON:  Head CT 07/07/2013. FINDINGS: Brain: Some generalized cerebral volume loss since 2015. No midline shift, ventriculomegaly, mass effect, evidence of mass lesion, intracranial hemorrhage or evidence of cortically based  acute infarction. Patchy bilateral cerebral white matter hypodensity is increased and appears moderate for age. Deep gray nuclei, brainstem and cerebellum appear spared. No cortical encephalomalacia identified. Vascular: Calcified atherosclerosis at the skull base. No suspicious intracranial vascular hyperdensity. Skull: No acute or suspicious osseous lesion identified. Sinuses/Orbits: Visualized paranasal sinuses and mastoids are stable and well aerated. Other: Visualized orbits and scalp soft tissues are within normal limits. IMPRESSION: 1. No acute intracranial abnormality. 2. Generalized cerebral volume loss since 2015, and moderate for age cerebral white matter changes, most commonly due to chronic small vessel disease. Electronically Signed   By: [name]   On: 06/25/2021 09:18   DG Chest Portable 1 View  Result Date: 06/25/2021 CLINICAL DATA:  Provided history: Altered mental status. Additional history provided: Difficulty walking, decreased strength, increased confusion. Jaundice. EXAM: PORTABLE CHEST 1 VIEW COMPARISON:  Prior chest radiographs 08/19/2020 and earlier. FINDINGS: Heart size within normal limits. Subtle ill-defined opacity within the bilateral left lung base, favored to reflect atelectasis. No  definite airspace consolidation. No evidence of pleural effusion or pneumothorax. No acute bony abnormality identified. IMPRESSION: Subtle ill-defined opacity within the lateral left lung base, favored to reflect atelectasis. Otherwise, no evidence of acute cardiopulmonary abnormality. Electronically Signed   By: [name]   On: 06/25/2021 09:19   US Abdomen Limited RUQ (LIVER/GB)  Result Date: 06/25/2021 CLINICAL DATA:  Cirrhosis. EXAM: ULTRASOUND ABDOMEN LIMITED RIGHT UPPER QUADRANT COMPARISON:  Abdominal ultrasound 04/09/2019; MRI abdomen 05/05/2019 FINDINGS: Gallbladder: No gallstones or wall thickening visualized. No sonographic Murphy sign noted by sonographer. Common bile duct: Diameter: 2 mm, within normal limits. Liver: Mildly nodular liver contour. Heterogeneous, course hepatic echotexture. The patient was unable to lie in left lateral decubitus position, limiting visualization of the right and left hepatic lobes. Overlying bowel-gas and patient confusion also limited this examination. No definite focal liver lesion is visualized. Portal vein is patent on color Doppler imaging with normal direction of blood flow towards the liver. Other: None. IMPRESSION: 1. Normal appearance of the gallbladder. 2. Mildly nodular liver contour and heterogeneous, coarse liver echogenicity. No definite liver lesion is identified. Electronically Signed   By: [name]   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  Based on underlying comorbidities and  imaging, with suspect NASH as primary etiology for cirrhosis - Extended serologic work-up for concomitant liver disease - EGD for esophageal varices screening - Depending on EGD findings and extended serologic work-up, discussed possible liver biopsy and/or outpatient elastography - Bilirubin fractionization - Continue lactulose and titrate to goal 3-4 soft stools daily - Decision to add rifaximin pending response to lactulose monotherapy - MELD 14  6) Thrombocytopenia - PLTs largely stable.  Related to underlying splenic sequestration.  Evaluating for concomitant portal hypertension/cirrhosis as above - Follows with Dr. Marin Olp in the Hematology clinic  7) Lower extremity edema - Continue diuretics per primary Hospitalist service   The indications, risks, and benefits of EGD were explained to the patient and his spouse in detail. Risks include but are not limited to bleeding,  perforation, adverse reaction to medications, and cardiopulmonary compromise. Sequelae include but are not limited to the possibility of surgery, hospitalization, and mortality. The patient verbalized understanding and wished to proceed.     LOS: 0 days   Lavena Bullion  06/26/2021, 10:32 AM

## 2021-06-26 NOTE — Progress Notes (Signed)
PROGRESS NOTE    Steve Watson  WFU:[id number] DOB: 1940-01-31 DOA: 06/25/2021 PCP: Mackie Pai, PA-C   Brief Narrative: 81 year old with past medical history significant for varicella-zoster, GERD, leukopenia, diabetes type 2, hypertension, presented due to altered mental status for the past few days associated with weakness and hypersomnolence.  He was found to have hepatic encephalopathy.     Assessment & Plan:   Principal Problem:   Hepatic encephalopathy (HCC) Active Problems:   Gastroesophageal reflux disease without esophagitis   Thrombocytopenia (HCC)   Leukopenia   Type 2 diabetes mellitus (HCC)   Hypertension   Normocytic anemia   Prolonged QT interval   Non-alcoholic micronodular cirrhosis of liver (HCC)   1-Acute Hepatic Encephalopathy:  In setting cirrhosis liver.  Started on lactulose. Continue.  Ammonia 150---100  He is more alert and answering questions today. Not at baseline yet.   2-Liver Cirrhosis:  ? NASH/.  INR 1.4 Hepatitis panel. Serology for hepatitis autoimmune evaluation ordered.  Bili elevation since 2019 (1.3--2.2) RUQ [place]; Mildly nodular liver contour and heterogeneous, coarse liver echogenicity. No definite liver lesion is identified. GI consulted. Plan for endoscopy for evaluation of esophageal varices.  Started on spironolactone. Increase to 50 mg daily/.  Hold HCTZ, might need lasix.   3-Hypokalemia:  Replete orally.   4-Anemia: labs consistent with anemia of chronic diseases.  Iron 138, Folate 12, B12 395.   5-Pyuria:  Check urine culture.  Started ceftriaxone.   Thrombocytopenia. Leukopenia. Chronic H/O splenomegaly.  Follow with Dr Marin Olp.  Follow trend.   Diabetes Type 2:  A1c at 4.7 two month ago.  On Diet.   HTN; now on spironolactone. Hold HCTZ.   Prolong QT; replete electrolytes.       Estimated body mass index is 28.89 kg/m as calculated from the following:   Height as of this encounter: 5' 8"   (1.727 m).   Weight as of this encounter: 86.2 kg.   DVT prophylaxis: SCD Code Status: Full code Family Communication: care discussed with Wife who was at bedside.  Disposition Plan:  Status is: Observation The patient will require care spanning > 2 midnights and should be moved to inpatient because: management of hepatic encephalopathy    Consultants:  GI   Procedures:  None  Antimicrobials:  IV ceftriaxone  Subjective: He is alert, he has been able to feed himself today.  He is able to tell me his name, recognized wife.   Objective: Vitals:   06/25/21 1838 06/25/21 2207 06/26/21 0206 06/26/21 0600  BP: (!) 156/77 (!) 148/69 112/65 (!) 118/59  Pulse: 94 91 90 85  Resp: 18 16 16 16   Temp: 98.4 F (36.9 C) 98.2 F (36.8 C) 97.7 F (36.5 C) 98 F (36.7 C)  TempSrc: Oral Oral Oral Oral  SpO2: 100% 100% 100% 100%  Weight:      Height:        Intake/Output Summary (Last 24 hours) at 06/26/2021 0714 Last data filed at 06/26/2021 0554 Gross per 24 hour  Intake 210 ml  Output 300 ml  Net -90 ml   Filed Weights   06/25/21 0851  Weight: 86.2 kg    Examination:  General exam: Appears calm and comfortable, icteric.  Respiratory system: Clear to auscultation. Respiratory effort normal. Cardiovascular system: S1 & S2 heard, RRR. Gastrointestinal system: Abdomen is nondistended, soft and nontender. No organomegaly or masses felt. Normal bowel sounds heard. Central nervous system: Alert and oriented to person, recognized wife.  Extremities: plus 1 edema  Data Reviewed: I have personally reviewed following labs and imaging studies  CBC: Recent Labs  Lab 06/25/21 0741 06/25/21 0925 06/26/21 0606  WBC 4.7 4.2 2.4*  NEUTROABS 3.0 3.0  --   HGB 12.0* 11.7* 9.7*  HCT 35.1* 32.9* 29.3*  MCV 99.2 96.8 102.1*  PLT 81* 65* 52*   Basic Metabolic Panel: Recent Labs  Lab 06/25/21 0741 06/25/21 0925 06/26/21 0606  NA 145 145 142  K 3.6 3.6 3.3*  CL 116* 120*  119*  CO2 20* 20* 20*  GLUCOSE 111* 113* 89  BUN 18 17 16   CREATININE 0.80 0.72 0.70  CALCIUM 9.3 8.9 8.1*   GFR: Estimated Creatinine Clearance: 80 mL/min (by C-G formula based on SCr of 0.7 mg/dL). Liver Function Tests: Recent Labs  Lab 06/25/21 0741 06/25/21 0925 06/26/21 0606  AST 36 45* 33  ALT 23 26 21   ALKPHOS 80 81 62  BILITOT 2.6* 2.4* 2.9*  PROT 5.9* 5.4* 4.8*  ALBUMIN 2.8* 2.4* 2.6*   Recent Labs  Lab 06/25/21 0925  LIPASE 45   Recent Labs  Lab 06/25/21 0925 06/26/21 0606  AMMONIA 151* 104*   Coagulation Profile: Recent Labs  Lab 06/25/21 1123  INR 1.4*   Cardiac Enzymes: No results for input(s): CKTOTAL, CKMB, CKMBINDEX, TROPONINI in the last 168 hours. BNP (last 3 results) Recent Labs    08/19/20 1335  PROBNP 82.0   HbA1C: No results for input(s): HGBA1C in the last 72 hours. CBG: Recent Labs  Lab 06/25/21 2048  GLUCAP 166*   Lipid Profile: No results for input(s): CHOL, HDL, LDLCALC, TRIG, CHOLHDL, LDLDIRECT in the last 72 hours. Thyroid Function Tests: No results for input(s): TSH, T4TOTAL, FREET4, T3FREE, THYROIDAB in the last 72 hours. Anemia Panel: No results for input(s): VITAMINB12, FOLATE, FERRITIN, TIBC, IRON, RETICCTPCT in the last 72 hours. Sepsis Labs: No results for input(s): PROCALCITON, LATICACIDVEN in the last 168 hours.  No results found for this or any previous visit (from the past 240 hour(s)).       Radiology Studies: CT Head Wo Contrast  Result Date: 06/25/2021 CLINICAL DATA:  81 year old male with difficulty walking, weakness, confusion, jaundiced. EXAM: CT HEAD WITHOUT CONTRAST TECHNIQUE: Contiguous axial images were obtained from the base of the skull through the vertex without intravenous contrast. RADIATION DOSE REDUCTION: This exam was performed according to the departmental dose-optimization program which includes automated exposure control, adjustment of the mA and/or kV according to patient size and/or  use of iterative reconstruction technique. COMPARISON:  Head CT 07/07/2013. FINDINGS: Brain: Some generalized cerebral volume loss since 2015. No midline shift, ventriculomegaly, mass effect, evidence of mass lesion, intracranial hemorrhage or evidence of cortically based acute infarction. Patchy bilateral cerebral white matter hypodensity is increased and appears moderate for age. Deep gray nuclei, brainstem and cerebellum appear spared. No cortical encephalomalacia identified. Vascular: Calcified atherosclerosis at the skull base. No suspicious intracranial vascular hyperdensity. Skull: No acute or suspicious osseous lesion identified. Sinuses/Orbits: Visualized paranasal sinuses and mastoids are stable and well aerated. Other: Visualized orbits and scalp soft tissues are within normal limits. IMPRESSION: 1. No acute intracranial abnormality. 2. Generalized cerebral volume loss since 2015, and moderate for age cerebral white matter changes, most commonly due to chronic small vessel disease. Electronically Signed   By: [name]   On: 06/25/2021 09:18   DG Chest Portable 1 View  Result Date: 06/25/2021 CLINICAL DATA:  Provided history: Altered mental status. Additional history provided: Difficulty walking, decreased strength, increased confusion.  Jaundice. EXAM: PORTABLE CHEST 1 VIEW COMPARISON:  Prior chest radiographs 08/19/2020 and earlier. FINDINGS: Heart size within normal limits. Subtle ill-defined opacity within the bilateral left lung base, favored to reflect atelectasis. No definite airspace consolidation. No evidence of pleural effusion or pneumothorax. No acute bony abnormality identified. IMPRESSION: Subtle ill-defined opacity within the lateral left lung base, favored to reflect atelectasis. Otherwise, no evidence of acute cardiopulmonary abnormality. Electronically Signed   By: [name]   On: 06/25/2021 09:19   US Abdomen Limited RUQ (LIVER/GB)  Result Date: 06/25/2021 CLINICAL DATA:   Cirrhosis. EXAM: ULTRASOUND ABDOMEN LIMITED RIGHT UPPER QUADRANT COMPARISON:  Abdominal ultrasound 04/09/2019; MRI abdomen 05/05/2019 FINDINGS: Gallbladder: No gallstones or wall thickening visualized. No sonographic Murphy sign noted by sonographer. Common bile duct: Diameter: 2 mm, within normal limits. Liver: Mildly nodular liver contour. Heterogeneous, course hepatic echotexture. The patient was unable to lie in left lateral decubitus position, limiting visualization of the right and left hepatic lobes. Overlying bowel-gas and patient confusion also limited this examination. No definite focal liver lesion is visualized. Portal vein is patent on color Doppler imaging with normal direction of blood flow towards the liver. Other: None. IMPRESSION: 1. Normal appearance of the gallbladder. 2. Mildly nodular liver contour and heterogeneous, coarse liver echogenicity. No definite liver lesion is identified. Electronically Signed   By: [name]   On: 06/25/2021 12:19        Scheduled Meds:  lactulose  30 g Oral TID   pantoprazole  40 mg Oral Daily   potassium chloride  40 mEq Oral Q4H   spironolactone  12.5 mg Oral Daily   Continuous Infusions:  cefTRIAXone (ROCEPHIN)  IV       LOS: 0 days    Time spent: 35 minutes.     Elmarie Shiley, MD [facility]   If 7PM-7AM, please contact night-coverage [email or web address]  06/26/2021, 7:14 AM

## 2021-06-26 NOTE — Progress Notes (Signed)
Patient has removed his IV, telemetry, and condom Cath Multiple times. Patient hands placed in Mitts for his safety. Will continue to monitor patient. Explained to patient the reason for him being placed in mitts.

## 2021-06-27 ENCOUNTER — Inpatient Hospital Stay (HOSPITAL_COMMUNITY): Payer: No Typology Code available for payment source | Admitting: Anesthesiology

## 2021-06-27 ENCOUNTER — Encounter (HOSPITAL_COMMUNITY): Payer: Self-pay | Admitting: Internal Medicine

## 2021-06-27 ENCOUNTER — Encounter (HOSPITAL_COMMUNITY)
Admission: EM | Disposition: A | Discharge status: Home Health | Payer: Self-pay | Source: Home / Self Care | Attending: Internal Medicine

## 2021-06-27 DIAGNOSIS — K2970 Gastritis, unspecified, without bleeding: Secondary | ICD-10-CM

## 2021-06-27 DIAGNOSIS — K7682 Hepatic encephalopathy: Secondary | ICD-10-CM | POA: Diagnosis not present

## 2021-06-27 DIAGNOSIS — I8510 Secondary esophageal varices without bleeding: Secondary | ICD-10-CM | POA: Diagnosis not present

## 2021-06-27 DIAGNOSIS — K269 Duodenal ulcer, unspecified as acute or chronic, without hemorrhage or perforation: Secondary | ICD-10-CM

## 2021-06-27 DIAGNOSIS — I10 Essential (primary) hypertension: Secondary | ICD-10-CM | POA: Diagnosis not present

## 2021-06-27 DIAGNOSIS — K7460 Unspecified cirrhosis of liver: Secondary | ICD-10-CM | POA: Diagnosis not present

## 2021-06-27 HISTORY — PX: BIOPSY: SHX5522

## 2021-06-27 HISTORY — PX: ESOPHAGOGASTRODUODENOSCOPY (EGD) WITH PROPOFOL: SHX5813

## 2021-06-27 LAB — CBC
HCT: 32 % — ABNORMAL LOW (ref 39.0–52.0)
Hemoglobin: 10.7 g/dL — ABNORMAL LOW (ref 13.0–17.0)
MCH: 34.1 pg — ABNORMAL HIGH (ref 26.0–34.0)
MCHC: 33.4 g/dL (ref 30.0–36.0)
MCV: 101.9 fL — ABNORMAL HIGH (ref 80.0–100.0)
Platelets: 53 10*3/uL — ABNORMAL LOW (ref 150–400)
RBC: 3.14 MIL/uL — ABNORMAL LOW (ref 4.22–5.81)
RDW: 15.4 % (ref 11.5–15.5)
WBC: 3.2 10*3/uL — ABNORMAL LOW (ref 4.0–10.5)
nRBC: 0 % (ref 0.0–0.2)

## 2021-06-27 LAB — COMPREHENSIVE METABOLIC PANEL
ALT: 21 U/L (ref 0–44)
AST: 33 U/L (ref 15–41)
Albumin: 2.5 g/dL — ABNORMAL LOW (ref 3.5–5.0)
Alkaline Phosphatase: 68 U/L (ref 38–126)
Anion gap: 3 — ABNORMAL LOW (ref 5–15)
BUN: 15 mg/dL (ref 8–23)
CO2: 20 mmol/L — ABNORMAL LOW (ref 22–32)
Calcium: 8.3 mg/dL — ABNORMAL LOW (ref 8.9–10.3)
Chloride: 120 mmol/L — ABNORMAL HIGH (ref 98–111)
Creatinine, Ser: 0.72 mg/dL (ref 0.61–1.24)
GFR, Estimated: >60 mL/min (ref >60)
Glucose, Bld: 99 mg/dL (ref 70–99)
Potassium: 4.1 mmol/L (ref 3.5–5.1)
Sodium: 143 mmol/L (ref 135–145)
Total Bilirubin: 2.4 mg/dL — ABNORMAL HIGH (ref 0.3–1.2)
Total Protein: 4.8 g/dL — ABNORMAL LOW (ref 6.5–8.1)

## 2021-06-27 LAB — SURGICAL PCR SCREEN
MRSA, PCR: NEGATIVE
Staphylococcus aureus: POSITIVE — AB

## 2021-06-27 LAB — GLUCOSE, CAPILLARY
Glucose-Capillary: 84 mg/dL (ref 70–99)
Glucose-Capillary: 80 mg/dL (ref 70–99)
Glucose-Capillary: 166 mg/dL — ABNORMAL HIGH (ref 70–99)
Glucose-Capillary: 118 mg/dL — ABNORMAL HIGH (ref 70–99)

## 2021-06-27 LAB — IGG, IGA, IGM
IgA: 394 mg/dL (ref 61–437)
IgG (Immunoglobin G), Serum: 1141 mg/dL (ref 603–1613)
IgM (Immunoglobulin M), Srm: 100 mg/dL (ref 15–143)

## 2021-06-27 LAB — URINE CULTURE: Culture: NO GROWTH

## 2021-06-27 LAB — ANTI-MICROSOMAL ANTIBODY LIVER / KIDNEY: LKM1 Ab: 0.7 Units (ref 0.0–20.0)

## 2021-06-27 LAB — AMMONIA: Ammonia: 95 umol/L — ABNORMAL HIGH (ref 9–35)

## 2021-06-27 SURGERY — ESOPHAGOGASTRODUODENOSCOPY (EGD) WITH PROPOFOL
Anesthesia: Monitor Anesthesia Care

## 2021-06-27 SURGERY — CANCELLED PROCEDURE
Anesthesia: Monitor Anesthesia Care

## 2021-06-27 MED ORDER — SODIUM CHLORIDE 0.9 % IV SOLN: INTRAVENOUS | Status: DC | PRN

## 2021-06-27 MED ORDER — LIDOCAINE 2% (20 MG/ML) 5 ML SYRINGE
INTRAMUSCULAR | Status: DC | PRN
  Administered 2021-06-27: 60 mg via INTRAVENOUS

## 2021-06-27 MED ORDER — GLUCAGON HCL RDNA (DIAGNOSTIC) 1 MG IJ SOLR
INTRAMUSCULAR | Status: AC
  Filled 2021-06-27: qty 1

## 2021-06-27 MED ORDER — PROPOFOL 500 MG/50ML IV EMUL
INTRAVENOUS | Status: DC | PRN
  Administered 2021-06-27: 100 ug/kg/min via INTRAVENOUS

## 2021-06-27 MED ORDER — PHENYLEPHRINE 80 MCG/ML (10ML) SYRINGE FOR IV PUSH (FOR BLOOD PRESSURE SUPPORT)
PREFILLED_SYRINGE | INTRAVENOUS | Status: DC | PRN
  Administered 2021-06-27: 80 ug via INTRAVENOUS

## 2021-06-27 MED ORDER — SODIUM CHLORIDE 0.9 % IV SOLN: INTRAVENOUS | Status: DC

## 2021-06-27 MED ORDER — RIFAXIMIN 550 MG PO TABS: 550.0000 mg | ORAL_TABLET | Freq: Two times a day (BID) | ORAL | Status: DC

## 2021-06-27 MED ORDER — PROPOFOL 10 MG/ML IV BOLUS
INTRAVENOUS | Status: DC | PRN
  Administered 2021-06-27: 25 mg via INTRAVENOUS
  Administered 2021-06-27 (×2): 20 mg via INTRAVENOUS

## 2021-06-27 MED ORDER — PHENYLEPHRINE HCL (PRESSORS) 10 MG/ML IV SOLN
INTRAVENOUS | Status: AC
  Filled 2021-06-27: qty 1

## 2021-06-27 MED ORDER — PROPOFOL 500 MG/50ML IV EMUL
INTRAVENOUS | Status: AC
  Filled 2021-06-27: qty 50

## 2021-06-27 MED ORDER — PANTOPRAZOLE SODIUM 40 MG PO TBEC
40.0000 mg | DELAYED_RELEASE_TABLET | Freq: Two times a day (BID) | ORAL | Status: DC
Start: 2021-06-27 — End: 2021-06-29
  Administered 2021-06-27 – 2021-06-29 (×5): 40 mg via ORAL
  Filled 2021-06-27 (×5): qty 1

## 2021-06-27 MED ORDER — RIFAXIMIN 550 MG PO TABS
550.0000 mg | ORAL_TABLET | Freq: Two times a day (BID) | ORAL | Status: DC
  Administered 2021-06-27 – 2021-06-29 (×5): 550 mg via ORAL
  Filled 2021-06-27 (×5): qty 1

## 2021-06-27 MED ORDER — ALBUMIN HUMAN 5 % IV SOLN
INTRAVENOUS | Status: AC
  Filled 2021-06-27: qty 500

## 2021-06-27 SURGICAL SUPPLY — 15 items

## 2021-06-27 NOTE — Progress Notes (Signed)
Patient came to endo unable to consent; family called multiple attempts. Procedure canceled per Dr Bryan Lemma

## 2021-06-27 NOTE — Anesthesia Procedure Notes (Signed)
Procedure Name: MAC Date/Time: 06/27/2021 11:21 AM Performed by: Renato Shin, CRNA Pre-anesthesia Checklist: Patient identified, Emergency Drugs available, Suction available and Patient being monitored Patient Re-evaluated:Patient Re-evaluated prior to induction Oxygen Delivery Method: Simple face mask Preoxygenation: Pre-oxygenation with 100% oxygen Induction Type: IV induction Placement Confirmation: positive ETCO2 and breath sounds checked- equal and bilateral Dental Injury: Teeth and Oropharynx as per pre-operative assessment

## 2021-06-27 NOTE — Anesthesia Preprocedure Evaluation (Signed)
Anesthesia Evaluation  Patient identified by MRN, date of birth, ID band Patient awake    Reviewed: Allergy & Precautions, NPO status , Patient's Chart, lab work & pertinent test results  Airway Mallampati: II  TM Distance: >3 FB Neck ROM: Full    Dental  (+) Dental Advisory Given   Pulmonary neg pulmonary ROS,    breath sounds clear to auscultation       Cardiovascular hypertension, Pt. on medications  Rhythm:Regular Rate:Normal     Neuro/Psych negative neurological ROS     GI/Hepatic GERD  ,(+) Cirrhosis       ,   Endo/Other  diabetes, Type 2  Renal/GU negative Renal ROS     Musculoskeletal   Abdominal   Peds  Hematology  (+) Blood dyscrasia, anemia ,   Anesthesia Other Findings   Reproductive/Obstetrics                             Anesthesia Physical Anesthesia Plan  ASA: 3  Anesthesia Plan: MAC   Post-op Pain Management: Minimal or no pain anticipated   Induction:   PONV Risk Score and Plan: 1 and Propofol infusion  Airway Management Planned: Natural Airway and Nasal Cannula  Additional Equipment:   Intra-op Plan:   Post-operative Plan:   Informed Consent: I have reviewed the patients History and Physical, chart, labs and discussed the procedure including the risks, benefits and alternatives for the proposed anesthesia with the patient or authorized representative who has indicated his/her understanding and acceptance.       Plan Discussed with: CRNA  Anesthesia Plan Comments:         Anesthesia Quick Evaluation

## 2021-06-27 NOTE — Interval H&P Note (Signed)
History and Physical Interval Note:  No acute events overnight.  Mild asterixis on exam.  Oriented to self.  H/H stable at 10.7/32.  Ammonia continues to downtrend, 95 today.  Liver panel with bilirubin fractionization demonstrates predominantly indirect hyperbilirubinemia with T. bili 1.8, indirect bilirubin 1.3.  Remainder of extended serologic work-up for concomitant liver disease pending.  Procedure discussed with patient's wife yesterday and again today and she provides consent to proceed with EGD today.   06/27/2021 11:05 AM  Steve Watson  has presented today for surgery, with the diagnosis of cirrhosis, esophageal varices screening.  The various methods of treatment have been discussed with the patient and family. After consideration of risks, benefits and other options for treatment, the patient (consent obtained via patient's wife at bedside) has consented to  Procedure(s): ESOPHAGOGASTRODUODENOSCOPY (EGD) WITH PROPOFOL (N/A) as a surgical intervention.  The patient's history has been reviewed, patient examined, no change in status, stable for surgery.  I have reviewed the patient's chart and labs.  Questions were answered to the patient's satisfaction.     Steve Watson

## 2021-06-27 NOTE — Progress Notes (Signed)
PROGRESS NOTE    Steve Watson  FMB:[id number] DOB: Jul 26, 1940 DOA: 06/25/2021 PCP: Mackie Pai, PA-C   Brief Narrative: 81 year old with past medical history significant for varicella-zoster, GERD, leukopenia, diabetes type 2, hypertension, presented due to altered mental status for the past few days associated with weakness and hypersomnolence.  He was found to have hepatic encephalopathy.   Assessment & Plan:   Principal Problem:   Hepatic encephalopathy (HCC) Active Problems:   Gastroesophageal reflux disease without esophagitis   Thrombocytopenia (HCC)   Leukopenia   Type 2 diabetes mellitus (HCC)   Hypertension   Normocytic anemia   Prolonged QT interval   Non-alcoholic micronodular cirrhosis of liver (HCC)   Gastritis and gastroduodenitis   Secondary esophageal varices without bleeding (HCC)   Duodenal ulcer   1-Acute Hepatic Encephalopathy:  In setting cirrhosis liver.  Started on lactulose. Continue.  Ammonia 150---100 ---95 Discussed with DR Cirigliano-- start rifaximin.   2-Liver Cirrhosis:  ? NASH/.  INR 1.4 Hepatitis panel. Serology for hepatitis autoimmune evaluation ordered.  Bili elevation since 2019 (1.3--2.2) RUQ [place]; Mildly nodular liver contour and heterogeneous, coarse liver echogenicity. No definite liver lesion is identified. GI consulted. Endoscopy:non bleeding duodenal ulcer, gastritis, grade 1 esophageal varices.  Started on spironolactone. Increase to 50 mg daily/.  Hold HCTZ, might need lasix.   3-Hypokalemia:  Replaced.   4-Anemia: labs consistent with anemia of chronic diseases.  Iron 138, Folate 12, B12 395.  Hb stable.   5-Pyuria:  urine culture no growth to date.  Stop ceftriaxone.   Duodenal ulcer, gastritis;  PPI> BID  Thrombocytopenia. Leukopenia. Chronic H/O splenomegaly.  Follow with Dr Marin Olp.  Follow trend.   Diabetes Type 2:  A1c at 4.7 two month ago.  On Diet.   HTN; now on spironolactone. Hold HCTZ.    Prolong QT; replete electrolytes.       Estimated body mass index is 28.89 kg/m as calculated from the following:   Height as of this encounter: 5' 8"  (1.727 m).   Weight as of this encounter: 86.2 kg.   DVT prophylaxis: SCD Code Status: Full code Family Communication: Care discussed with Wife who was at bedside.  Disposition Plan:  Status is: Observation The patient will require care spanning > 2 midnights and should be moved to inpatient because: management of hepatic encephalopathy    Consultants:  GI   Procedures:  None  Antimicrobials:  IV ceftriaxone  Subjective: He is alert, had endoscopy.  Per wife his MS is improving.   Objective: Vitals:   06/27/21 1132 06/27/21 1140 06/27/21 1150 06/27/21 1323  BP: 110/68 128/67 123/67 134/64  Pulse: 85 82 80 89  Resp: (!) 21 (!) 23 16 18   Temp:    98 F (36.7 C)  TempSrc:      SpO2: 100% 100% 99% 100%  Weight:      Height:        Intake/Output Summary (Last 24 hours) at 06/27/2021 1409 Last data filed at 06/27/2021 1134 Gross per 24 hour  Intake 740 ml  Output 300 ml  Net 440 ml    Filed Weights   06/25/21 0851  Weight: 86.2 kg    Examination:  General exam: Appears calm and comfortable, icteric.  Respiratory system: Clear to auscultation. Respiratory effort normal. Cardiovascular system: S1 & S2 heard, RRR. Gastrointestinal system: Abdomen is nondistended, soft and nontender. No organomegaly or masses felt. Normal bowel sounds heard. Central nervous system: Alert and oriented to person, recognized wife.  Extremities: plus  1 edema    Data Reviewed: I have personally reviewed following labs and imaging studies  CBC: Recent Labs  Lab 06/25/21 0741 06/25/21 0925 06/26/21 0606 06/27/21 0529  WBC 4.7 4.2 2.4* 3.2*  NEUTROABS 3.0 3.0  --   --   HGB 12.0* 11.7* 9.7* 10.7*  HCT 35.1* 32.9* 29.3* 32.0*  MCV 99.2 96.8 102.1* 101.9*  PLT 81* 65* 52* 53*    Basic Metabolic Panel: Recent Labs   Lab 06/25/21 0741 06/25/21 0925 06/26/21 0606 06/27/21 0529  NA 145 145 142 143  K 3.6 3.6 3.3* 4.1  CL 116* 120* 119* 120*  CO2 20* 20* 20* 20*  GLUCOSE 111* 113* 89 99  BUN 18 17 16 15   CREATININE 0.80 0.72 0.70 0.72  CALCIUM 9.3 8.9 8.1* 8.3*    GFR: Estimated Creatinine Clearance: 80 mL/min (by C-G formula based on SCr of 0.72 mg/dL). Liver Function Tests: Recent Labs  Lab 06/25/21 0741 06/25/21 0925 06/26/21 0606 06/26/21 1653 06/27/21 0529  AST 36 45* 33 34 33  ALT 23 26 21 22 21   ALKPHOS 80 81 62 66 68  BILITOT 2.6* 2.4* 2.9* 1.8* 2.4*  PROT 5.9* 5.4* 4.8* 5.1* 4.8*  ALBUMIN 2.8* 2.4* 2.6* 2.8* 2.5*    Recent Labs  Lab 06/25/21 0925  LIPASE 45    Recent Labs  Lab 06/25/21 0925 06/26/21 0606 06/27/21 0529  AMMONIA 151* 104* 95*    Coagulation Profile: Recent Labs  Lab 06/25/21 1123  INR 1.4*    Cardiac Enzymes: No results for input(s): CKTOTAL, CKMB, CKMBINDEX, TROPONINI in the last 168 hours. BNP (last 3 results) Recent Labs    08/19/20 1335  PROBNP 82.0    HbA1C: No results for input(s): HGBA1C in the last 72 hours. CBG: Recent Labs  Lab 06/26/21 0730 06/26/21 1132 06/26/21 1549 06/27/21 0744 06/27/21 1247  GLUCAP 82 127* 102* 84 80    Lipid Profile: No results for input(s): CHOL, HDL, LDLCALC, TRIG, CHOLHDL, LDLDIRECT in the last 72 hours. Thyroid Function Tests: No results for input(s): TSH, T4TOTAL, FREET4, T3FREE, THYROIDAB in the last 72 hours. Anemia Panel: Recent Labs    06/26/21 0606  VITAMINB12 395  FOLATE 12.6  FERRITIN 28  TIBC 208*  IRON 138  RETICCTPCT 2.5   Sepsis Labs: No results for input(s): PROCALCITON, LATICACIDVEN in the last 168 hours.  Recent Results (from the past 240 hour(s))  Urine Culture     Status: None   Collection Time: 06/26/21 11:14 AM   Specimen: Urine, Clean Catch  Result Value Ref Range Status   Specimen Description   Final    URINE, CLEAN CATCH Performed at [facility], [place] [address], [place], [place] [address]    Special Requests   Final    NONE Performed at [facility], [place] [address] [place], [place] [address]    Culture   Final    NO GROWTH Performed at [facility], [place] [address] [place], [place] [address]    Report Status 06/27/2021 FINAL  Final  Surgical pcr screen     Status: Abnormal   Collection Time: 06/27/21  2:09 AM   Specimen: Nasal Mucosa; Nasal Swab  Result Value Ref Range Status   MRSA, PCR NEGATIVE NEGATIVE Final   Staphylococcus aureus POSITIVE (A) NEGATIVE Final    Comment: (NOTE) The Xpert SA Assay (FDA approved for NASAL specimens in patients 89 years of age and older), is one component of a  comprehensive surveillance program. It is not intended to diagnose infection nor to guide or monitor treatment. Performed at [facility], [place] [address] [place], [place] [address]          Radiology Studies: No results found.      Scheduled Meds:  lactulose  30 g Oral TID   pantoprazole  40 mg Oral Daily   spironolactone  50 mg Oral Daily   Continuous Infusions:  cefTRIAXone (ROCEPHIN)  IV 2 g (06/27/21 0820)     LOS: 1 day    Time spent: 35 minutes.     Elmarie Shiley, MD [facility]   If 7PM-7AM, please contact night-coverage [email or web address]  06/27/2021, 2:09 PM

## 2021-06-27 NOTE — Transfer of Care (Signed)
Immediate Anesthesia Transfer of Care Note  Patient: Steve Watson  Procedure(s) Performed: CANCELLED PROCEDURE ESOPHAGOGASTRODUODENOSCOPY (EGD) WITH PROPOFOL BIOPSY  Patient Location: PACU  Anesthesia Type:MAC  Level of Consciousness: drowsy and patient cooperative  Airway & Oxygen Therapy: Patient Spontanous Breathing and Patient connected to face mask oxygen  Post-op Assessment: Report given to RN and Post -op Vital signs reviewed and stable  Post vital signs: Reviewed and stable  Last Vitals:  Vitals Value Taken Time  BP 110/68 06/27/21 1132  Temp    Pulse 87 06/27/21 1134  Resp 24 06/27/21 1134  SpO2 100 % 06/27/21 1134  Vitals shown include unvalidated device data.  Last Pain:  Vitals:   06/27/21 1100  TempSrc: Temporal  PainSc: 0-No pain         Complications: No notable events documented.

## 2021-06-27 NOTE — Op Note (Signed)
[facility] Patient Name: Steve Watson Procedure Date: 06/27/2021 MRN: [id number] Attending MD: Gerrit Heck , MD Date of Birth: 18-Mar-1940 CSN: [id number] Age: 81 Admit Type: Inpatient Procedure:                Upper GI endoscopy Indications:              Cirrhosis rule out esophageal varices Providers:                Gerrit Heck, MD, Grace Isaac, RN, Benetta Spar, Technician Referring MD:              Medicines:                Monitored Anesthesia Care Complications:            No immediate complications. Estimated Blood Loss:     Estimated blood loss was minimal. Procedure:                Pre-Anesthesia Assessment:                           - Prior to the procedure, a History and Physical                            was performed, and patient medications and                            allergies were reviewed. The patient's tolerance of                            previous anesthesia was also reviewed. The risks                            and benefits of the procedure and the sedation                            options and risks were discussed with the patient.                            All questions were answered, and informed consent                            was obtained. Prior Anticoagulants: The patient has                            taken no previous anticoagulant or antiplatelet                            agents. ASA Grade Assessment: III - A patient with                            severe systemic disease. After reviewing the risks  and benefits, the patient was deemed in                            satisfactory condition to undergo the procedure.                           After obtaining informed consent, the endoscope was                            passed under direct vision. Throughout the                            procedure, the patient's blood pressure, pulse, and                             oxygen saturations were monitored continuously. The                            [id number]-H190 ([id number]) Olympus endoscope was introduced                            through the mouth, and advanced to the second part                            of duodenum. The upper GI endoscopy was                            accomplished without difficulty. The patient                            tolerated the procedure well. Scope In: Scope Out: Findings:      Single column of Grade I varices was found in the lower third of the       esophagus. They were 5 mm in largest diameter.      The Z-line was regular and was found 40 cm from the incisors.      Diffuse moderate inflammation characterized by congestion (edema) and       erythema was found in the gastric fundus, in the gastric body and in the       gastric antrum. Biopsies were taken with a cold forceps for histology.       Estimated blood loss was minimal.      Two non-bleeding cratered duodenal ulcers with no stigmata of bleeding       were found in the duodenal bulb. The largest lesion was 5 mm in largest       dimension. There was moderate luminal distortion from the ulcer.      The second portion of the duodenum was normal. Impression:               - Grade I esophageal varices.                           - Z-line regular, 40 cm from the incisors.                           -  Gastritis. Biopsied.                           - Non-bleeding duodenal ulcers with no stigmata of                            bleeding.                           - Normal second portion of the duodenum. Moderate Sedation:      Not Applicable - Patient had care per Anesthesia. Recommendation:           - Return patient to hospital ward for ongoing care.                           - Advance diet as tolerated.                           - Continue present medications.                           - Use Protonix (pantoprazole) 40 mg PO BID for 8                            weeks to promote  mucosal healing, then reduce to 40                            mg daily.                           - Await pathology results.                           - Repeat upper endoscopy in 8 weeks to check                            healing.                           - Will follow-up on pending studies to evaluate for                            underlying liver disease.                           - Start Rifaximin 550 mg BID and resume Lactulose.                           - GI service will continue to follow. Procedure Code(s):        --- Professional ---                           [phone], Esophagogastroduodenoscopy, flexible,                            transoral; with biopsy, single or multiple Diagnosis Code(s):        ---  Professional ---                           K74.60, Unspecified cirrhosis of liver                           I85.10, Secondary esophageal varices without                            bleeding                           K29.70, Gastritis, unspecified, without bleeding                           K26.9, Duodenal ulcer, unspecified as acute or                            chronic, without hemorrhage or perforation CPT copyright 2019 American Medical Association. All rights reserved. The codes documented in this report are preliminary and upon coder review may  be revised to meet current compliance requirements. Gerrit Heck, MD 06/27/2021 11:37:02 AM Number of Addenda: 0

## 2021-06-27 NOTE — Anesthesia Postprocedure Evaluation (Signed)
Anesthesia Post Note  Patient: Steve Watson  Procedure(s) Performed: CANCELLED PROCEDURE ESOPHAGOGASTRODUODENOSCOPY (EGD) WITH PROPOFOL BIOPSY     Patient location during evaluation: PACU Anesthesia Type: MAC Level of consciousness: awake and alert Pain management: pain level controlled Vital Signs Assessment: post-procedure vital signs reviewed and stable Respiratory status: spontaneous breathing, nonlabored ventilation, respiratory function stable and patient connected to nasal cannula oxygen Cardiovascular status: stable and blood pressure returned to baseline Postop Assessment: no apparent nausea or vomiting Anesthetic complications: no   No notable events documented.  Last Vitals:  Vitals:   06/27/21 1150 06/27/21 1323  BP: 123/67 134/64  Pulse: 80 89  Resp: 16 18  Temp:  36.7 C  SpO2: 99% 100%    Last Pain:  Vitals:   06/27/21 1100  TempSrc: Temporal  PainSc: 0-No pain                 Tiajuana Amass

## 2021-06-28 ENCOUNTER — Encounter (HOSPITAL_COMMUNITY): Payer: Self-pay | Admitting: Gastroenterology

## 2021-06-28 DIAGNOSIS — K7469 Other cirrhosis of liver: Secondary | ICD-10-CM | POA: Diagnosis not present

## 2021-06-28 DIAGNOSIS — D539 Nutritional anemia, unspecified: Secondary | ICD-10-CM | POA: Diagnosis not present

## 2021-06-28 DIAGNOSIS — D696 Thrombocytopenia, unspecified: Secondary | ICD-10-CM | POA: Diagnosis not present

## 2021-06-28 DIAGNOSIS — K269 Duodenal ulcer, unspecified as acute or chronic, without hemorrhage or perforation: Secondary | ICD-10-CM | POA: Diagnosis not present

## 2021-06-28 DIAGNOSIS — K7682 Hepatic encephalopathy: Secondary | ICD-10-CM | POA: Diagnosis not present

## 2021-06-28 DIAGNOSIS — D72829 Elevated white blood cell count, unspecified: Secondary | ICD-10-CM

## 2021-06-28 LAB — CBC
HCT: 31.6 % — ABNORMAL LOW (ref 39.0–52.0)
Hemoglobin: 10.5 g/dL — ABNORMAL LOW (ref 13.0–17.0)
MCH: 34.7 pg — ABNORMAL HIGH (ref 26.0–34.0)
MCHC: 33.2 g/dL (ref 30.0–36.0)
MCV: 104.3 fL — ABNORMAL HIGH (ref 80.0–100.0)
Platelets: 55 10*3/uL — ABNORMAL LOW (ref 150–400)
RBC: 3.03 MIL/uL — ABNORMAL LOW (ref 4.22–5.81)
RDW: 15.6 % — ABNORMAL HIGH (ref 11.5–15.5)
WBC: 3.7 10*3/uL — ABNORMAL LOW (ref 4.0–10.5)
nRBC: 0 % (ref 0.0–0.2)

## 2021-06-28 LAB — BASIC METABOLIC PANEL
Anion gap: 4 — ABNORMAL LOW (ref 5–15)
BUN: 13 mg/dL (ref 8–23)
CO2: 17 mmol/L — ABNORMAL LOW (ref 22–32)
Calcium: 8 mg/dL — ABNORMAL LOW (ref 8.9–10.3)
Chloride: 122 mmol/L — ABNORMAL HIGH (ref 98–111)
Creatinine, Ser: 0.7 mg/dL (ref 0.61–1.24)
GFR, Estimated: >60 mL/min (ref >60)
Glucose, Bld: 98 mg/dL (ref 70–99)
Potassium: 3.9 mmol/L (ref 3.5–5.1)
Sodium: 143 mmol/L (ref 135–145)

## 2021-06-28 LAB — GLUCOSE, CAPILLARY
Glucose-Capillary: 86 mg/dL (ref 70–99)
Glucose-Capillary: 108 mg/dL — ABNORMAL HIGH (ref 70–99)
Glucose-Capillary: 107 mg/dL — ABNORMAL HIGH (ref 70–99)
Glucose-Capillary: 123 mg/dL — ABNORMAL HIGH (ref 70–99)

## 2021-06-28 LAB — ANA: Anti Nuclear Antibody (ANA): NEGATIVE

## 2021-06-28 LAB — AMMONIA: Ammonia: 124 umol/L — ABNORMAL HIGH (ref 9–35)

## 2021-06-28 MED ORDER — LACTULOSE 10 GM/15ML PO SOLN
20.0000 g | Freq: Two times a day (BID) | ORAL | Status: DC
  Administered 2021-06-28 – 2021-06-29 (×2): 20 g via ORAL
  Filled 2021-06-28 (×2): qty 30

## 2021-06-28 MED ORDER — SODIUM CHLORIDE 0.9 % IV SOLN: INTRAVENOUS | Status: AC

## 2021-06-28 NOTE — Progress Notes (Signed)
Steve Watson seems to be much more alert and somewhat oriented than when I saw him on Friday in the office.  He had a panic encephalopathy.  I think his ammonia was 151.  This has improved.  He did undergo upper endoscopy over the weekend.  This showed some varices which is no surprise.  I do not think there is been any bleeding.  Yesterday, his white count is 3.2.  Hemoglobin 10.7.  Platelet count 53,000.  This is all from his cirrhosis.  He had ultrasound of his right upper quadrant which showed there was changes liver consistent with cirrhosis.  I am sure he has some splenomegaly.  I am unsure what is actual functional status is.  I am unsure if he is really getting out of bed and walking.  I am also not sure how much he really is eating.  Yesterday, his ammonia was down to 95.  He is on lactulose.  He is also on some Xifaxin.  There is no obvious pain.  There is no obvious bleeding.  His vital signs are temperature of 97 7.  Pulse 93.  Blood pressure 142/78.  His head exam shows no scleral icterus.  He has no adenopathy in the neck.  His lungs sound clear bilaterally.  He has good air movement bilaterally.  Cardiac exam regular rate and rhythm.  Abdomen is soft.  Bowel sounds are present.  There may be his liver edge palpable at the right costal margin.  His spleen tip might be palpable just below the left costal margin.  Extremity shows no clubbing, cyanosis or edema.  Skin exam shows some scattered ecchymoses.  Neurological exam shows no focal deficits.  I am just glad that he is better neurologically.  I had to suspect that he is got have episodes of confusion because of his cirrhosis.  His blood counts are holding pretty stable, for him.  He has the leukopenia and thrombocytopenia.  We have been following this for quite a while and these have helped pretty steady.  I know that he is getting wonderful care from all the staff up on 5 E.  Hopefully, he will be able to go home at some point.   Again I am unsure what kind of functional state he has right now.  Lattie Haw, MD  [place] 12:12

## 2021-06-28 NOTE — TOC Transition Note (Signed)
Transition of Care [facility]) - CM/SW Discharge Note   Patient Details  Name: Steve Watson MRN: [id number] Date of Birth: May 22, 1940  [facility] CM/SW Contact:  Vassie Moselle, LCSW Phone Number: 06/28/2021, 1:54 PM   Clinical Narrative:    Met with pt and his wife, Steve Watson and confirmed recommendations for HHPT. Pt/wife are agreeable to recommendations and have been set up with [facility] for HHPT. [facility] will also provide HHOT if this is the recommendation by OT once eval has been completed.  Pt confirmed need for rw. RW will be delivered to pt's room by [place]. No further TOC needs identified at this time.   Final next level of care: [place] Barriers to Discharge: Continued Medical Work up   Patient Goals and CMS Choice Patient states their goals for this hospitalization and ongoing recovery are:: Return home   Choice offered to / list presented to : Patient  Discharge Placement                       Discharge Plan and Services                DME Arranged: Gilford Rile rolling DME Agency: [facility] [employer] Date DME Agency Contacted: 06/28/21 Time DME Agency Contacted: [phone] Representative spoke with at DME Agency: [place] HH Arranged: PT [place]:  ([place]) Date [place]: 06/28/21 Time [place]: 1300 Representative spoke with at [place]: [place] ([place]) Interventions     Readmission Risk Interventions     View : No data to display.

## 2021-06-28 NOTE — Evaluation (Signed)
Physical Therapy Evaluation Patient Details Name: Steve Watson MRN: [id number] DOB: 07/04/1940 Today's Date: 06/28/2021  History of Present Illness  81 year old with past medical history significant for varicella-zoster, GERD, leukopenia, diabetes type 2, hypertension, presented due to altered mental status for the past few days associated with weakness and hypersomnolence.  He was found to have hepatic encephalopathy.  Clinical Impression  Pt admitted with above diagnosis.  PT is pleasant and cooperative, Ox2, unsure of month, follows commands well.  Mobility limited d/t stool  incontinence. Pt will likely need HHPT and RW pending progress in acute setting.  Pt currently with functional limitations due to the deficits listed below (see PT Problem List). Pt will benefit from skilled PT to increase their independence and safety with mobility to allow discharge to the venue listed below.          Recommendations for follow up therapy are one component of a multi-disciplinary discharge planning process, led by the attending physician.  Recommendations may be updated based on patient status, additional functional criteria and insurance authorization.  Follow Up Recommendations Home health PT    Assistance Recommended at Discharge Intermittent Supervision/Assistance  Patient can return home with the following  Help with stairs or ramp for entrance;Direct supervision/assist for financial management;Assist for transportation;A little help with bathing/dressing/bathroom    Equipment Recommendations Rolling walker (2 wheels)  Recommendations for Other Services       Functional Status Assessment Patient has had a recent decline in their functional status and demonstrates the ability to make significant improvements in function in a reasonable and predictable amount of time.     Precautions / Restrictions        Mobility  Bed Mobility Overal bed mobility: Needs Assistance Bed Mobility:  Supine to Sit, Sit to Supine     Supine to sit: Min assist Sit to supine: Min guard   General bed mobility comments: assist to elevate trunk, cues to complete return to supine without assist, incr time needed    Transfers Overall transfer level: Needs assistance Equipment used: Rolling walker (2 wheels) Transfers: Sit to/from Stand Sit to Stand: Min guard           General transfer comment: cues for hand placement and safety; pt able to stand ~ 3 minutes for peri-care (incontinent of stool)    Ambulation/Gait Ambulation/Gait assistance: Min assist Gait Distance (Feet): 25 Feet Assistive device: Rolling walker (2 wheels) Gait Pattern/deviations: Step-through pattern, Decreased stride length Gait velocity: DECR     General Gait Details: intermittent assist to steady and maneuver RW  Stairs            Wheelchair Mobility    Modified Rankin (Stroke Patients Only)       Balance Overall balance assessment: Needs assistance   Sitting balance-Leahy Scale: Good     Standing balance support: Reliant on assistive device for balance Standing balance-Leahy Scale: Poor                               Pertinent Vitals/Pain Pain Assessment Pain Assessment: No/denies pain    Home Living Family/patient expects to be discharged to:: Private residence Living Arrangements: Spouse/significant other Available Help at Discharge: Family;Available 24 hours/day Type of Home: House         Home Layout: One level Home Equipment: None      Prior Function Prior Level of Function : Independent/Modified Independent  Hand Dominance        Extremity/Trunk Assessment   Upper Extremity Assessment Upper Extremity Assessment: Defer to OT evaluation    Lower Extremity Assessment Lower Extremity Assessment: Generalized weakness       Communication   Communication: No difficulties  Cognition Arousal/Alertness:  Awake/alert Behavior During Therapy: Flat affect Overall Cognitive Status: Impaired/Different from baseline Area of Impairment: Orientation, Following commands                 Orientation Level: Disoriented to, Time     Following Commands: Follows one step commands with increased time, Follows multi-step commands with increased time                General Comments      Exercises     Assessment/Plan    PT Assessment Patient needs continued PT services  PT Problem List Decreased strength;Decreased mobility;Decreased range of motion;Decreased activity tolerance;Decreased balance;Decreased knowledge of use of DME       PT Treatment Interventions DME instruction;Therapeutic exercise;Gait training;Balance training;Functional mobility training;Therapeutic activities;Patient/family education    PT Goals (Current goals can be found in the Care Plan section)  Acute Rehab PT Goals Patient Stated Goal: home soon PT Goal Formulation: With patient/family Time For Goal Achievement: 07/12/21 Potential to Achieve Goals: Good    Frequency Min 3X/week     Co-evaluation               AM-PAC PT "6 Clicks" Mobility  Outcome Measure Help needed turning from your back to your side while in a flat bed without using bedrails?: A Little Help needed moving from lying on your back to sitting on the side of a flat bed without using bedrails?: A Little Help needed moving to and from a bed to a chair (including a wheelchair)?: A Little Help needed standing up from a chair using your arms (e.g., wheelchair or bedside chair)?: A Little Help needed to walk in hospital room?: A Little Help needed climbing 3-5 steps with a railing? : A Lot 6 Click Score: 17    End of Session Equipment Utilized During Treatment: Gait belt Activity Tolerance: Patient tolerated treatment well Patient left: in bed;with call bell/phone within reach;with bed alarm set;with family/visitor present;with  nursing/sitter in room Nurse Communication: Mobility status PT Visit Diagnosis: Other abnormalities of gait and mobility (R26.89);Difficulty in walking, not elsewhere classified (R26.2)    Time: 1308-6578 PT Time Calculation (min) (ACUTE ONLY): 27 min   Charges:   PT Evaluation $PT Eval Low Complexity: 1 Low PT Treatments $Gait Training: 8-22 mins        Baxter Flattery, PT  Acute Rehab Dept ([place]) [phone] Pager [phone]  06/28/2021   [facility] 06/28/2021, 11:46 AM

## 2021-06-28 NOTE — Progress Notes (Addendum)
PROGRESS NOTE    Steve Watson  JQZ:[id number] DOB: [birth year] DOA: 06/25/2021 PCP: Mackie Pai, PA-C   Brief Narrative: 81 year old with past medical history significant for varicella-zoster, GERD, leukopenia, diabetes type 2, hypertension, presented due to altered mental status for the past few days associated with weakness and hypersomnolence.  He was found to have hepatic encephalopathy.   Assessment & Plan:   Principal Problem:   Hepatic encephalopathy (HCC) Active Problems:   Gastroesophageal reflux disease without esophagitis   Thrombocytopenia (HCC)   Leukopenia   Type 2 diabetes mellitus (HCC)   Hypertension   Normocytic anemia   Prolonged QT interval   Non-alcoholic micronodular cirrhosis of liver (HCC)   Gastritis and gastroduodenitis   Secondary esophageal varices without bleeding (HCC)   Duodenal ulcer   1-Acute Hepatic Encephalopathy:  In setting cirrhosis liver.  Started on lactulose. Continue.  Ammonia 150---100 ---95-125 Discussed with Dr Bryan Lemma-- started  rifaximin.  Improving.  Having diarrhea, lactulose reduce to BID>   2-Liver Cirrhosis:  ? NASH/.  INR 1.4 Hepatitis panel. Serology for hepatitis autoimmune evaluation ordered and pending Bili elevation since 2019 (1.3--2.2) RUQ [place]; Mildly nodular liver contour and heterogeneous, coarse liver echogenicity. No definite liver lesion is identified. GI consulted. Endoscopy:non bleeding duodenal ulcer, gastritis, grade 1 esophageal varices.  Started on spironolactone. Increase to 50 mg daily/.  Hold HCTZ, might need lasix.   3-Hypokalemia:  Replaced.   4-Anemia: labs consistent with anemia of chronic diseases.  Iron 138, Folate 12, B12 395.  Hb stable.   5-Metabolic acidosis; in setting diarrhea. Lactulose reduce. Started IV fluids for 4 hours.   Pyuria:  urine culture no growth to date.  Stop ceftriaxone.   Duodenal ulcer, gastritis;  PPI> BID for 8 weeks then daily.  Will need  follow up endoscopy  Thrombocytopenia. Leukopenia. Chronic H/O splenomegaly.  Follow with Dr Marin Olp.  Follow trend.   Diabetes Type 2:  A1c at 4.7 two month ago.  On Diet.   HTN; now on spironolactone. Hold HCTZ.   Prolong QT; replete electrolytes.       Estimated body mass index is 28.89 kg/m as calculated from the following:   Height as of this encounter: 5' 8"  (1.727 m).   Weight as of this encounter: 86.2 kg.   DVT prophylaxis: SCD Code Status: Full code Family Communication: Care discussed with Wife who was at bedside.  Disposition Plan:  Status is: Observation The patient will require care spanning > 2 midnights and should be moved to inpatient because: management of hepatic encephalopathy    Consultants:  GI   Procedures:  None  Antimicrobials:  IV ceftriaxone  Subjective: He is alert, recognized wife.  Denies pain  MS improved.   Objective: Vitals:   06/27/21 1323 06/27/21 1959 06/28/21 0526 06/28/21 1421  BP: 134/64 127/67 (!) 142/78 126/83  Pulse: 89 96 93 96  Resp: 18 18 18 18   Temp: 98 F (36.7 C) 100.1 F (37.8 C) 97.7 F (36.5 C) 98.8 F (37.1 C)  TempSrc:   Oral   SpO2: 100% 100% 100% 100%  Weight:      Height:        Intake/Output Summary (Last 24 hours) at 06/28/2021 1441 Last data filed at 06/28/2021 0600 Gross per 24 hour  Intake 150 ml  Output 700 ml  Net -550 ml    Filed Weights   06/25/21 0851  Weight: 86.2 kg    Examination:  General exam: NAD Respiratory system: CTA Cardiovascular system: S  1, S 2 RRR Gastrointestinal system: BS present, soft, nt, nd Central nervous system: alert, follows command Extremities: Plus 1 edema    Data Reviewed: I have personally reviewed following labs and imaging studies  CBC: Recent Labs  Lab 06/25/21 0741 06/25/21 0925 06/26/21 0606 06/27/21 0529 06/28/21 0804  WBC 4.7 4.2 2.4* 3.2* 3.7*  NEUTROABS 3.0 3.0  --   --   --   HGB 12.0* 11.7* 9.7* 10.7* 10.5*  HCT  35.1* 32.9* 29.3* 32.0* 31.6*  MCV 99.2 96.8 102.1* 101.9* 104.3*  PLT 81* 65* 52* 53* 55*    Basic Metabolic Panel: Recent Labs  Lab 06/25/21 0741 06/25/21 0925 06/26/21 0606 06/27/21 0529 06/28/21 0804  NA 145 145 142 143 143  K 3.6 3.6 3.3* 4.1 3.9  CL 116* 120* 119* 120* 122*  CO2 20* 20* 20* 20* 17*  GLUCOSE 111* 113* 89 99 98  BUN 18 17 16 15 13   CREATININE 0.80 0.72 0.70 0.72 0.70  CALCIUM 9.3 8.9 8.1* 8.3* 8.0*    GFR: Estimated Creatinine Clearance: 80 mL/min (by C-G formula based on SCr of 0.7 mg/dL). Liver Function Tests: Recent Labs  Lab 06/25/21 0741 06/25/21 0925 06/26/21 0606 06/26/21 1653 06/27/21 0529  AST 36 45* 33 34 33  ALT 23 26 21 22 21   ALKPHOS 80 81 62 66 68  BILITOT 2.6* 2.4* 2.9* 1.8* 2.4*  PROT 5.9* 5.4* 4.8* 5.1* 4.8*  ALBUMIN 2.8* 2.4* 2.6* 2.8* 2.5*    Recent Labs  Lab 06/25/21 0925  LIPASE 45    Recent Labs  Lab 06/25/21 0925 06/26/21 0606 06/27/21 0529 06/28/21 0804  AMMONIA 151* 104* 95* 124*    Coagulation Profile: Recent Labs  Lab 06/25/21 1123  INR 1.4*    Cardiac Enzymes: No results for input(s): CKTOTAL, CKMB, CKMBINDEX, TROPONINI in the last 168 hours. BNP (last 3 results) Recent Labs    08/19/20 1335  PROBNP 82.0    HbA1C: No results for input(s): HGBA1C in the last 72 hours. CBG: Recent Labs  Lab 06/27/21 1247 06/27/21 1647 06/27/21 2050 06/28/21 0757 06/28/21 1203  GLUCAP 80 166* 118* 86 108*    Lipid Profile: No results for input(s): CHOL, HDL, LDLCALC, TRIG, CHOLHDL, LDLDIRECT in the last 72 hours. Thyroid Function Tests: No results for input(s): TSH, T4TOTAL, FREET4, T3FREE, THYROIDAB in the last 72 hours. Anemia Panel: Recent Labs    06/26/21 0606  VITAMINB12 395  FOLATE 12.6  FERRITIN 28  TIBC 208*  IRON 138  RETICCTPCT 2.5    Sepsis Labs: No results for input(s): PROCALCITON, LATICACIDVEN in the last 168 hours.  Recent Results (from the past 240 hour(s))  Urine  Culture     Status: None   Collection Time: 06/26/21 11:14 AM   Specimen: Urine, Clean Catch  Result Value Ref Range Status   Specimen Description   Final    URINE, CLEAN CATCH Performed at [facility], [place] [address], [place], [place] [address]    Special Requests   Final    NONE Performed at [facility], [place] [address] [place], [place] [address]    Culture   Final    NO GROWTH Performed at [facility], [place] [address] [place],  [address]    Report Status 06/27/2021 FINAL  Final  Surgical pcr screen     Status: Abnormal   Collection Time: 06/27/21  2:09 AM   Specimen: Nasal Mucosa; Nasal Swab  Result Value Ref Range  Status   MRSA, PCR NEGATIVE NEGATIVE Final   Staphylococcus aureus POSITIVE (A) NEGATIVE Final    Comment: (NOTE) The Xpert SA Assay (FDA approved for NASAL specimens in patients 80 years of age and older), is one component of a comprehensive surveillance program. It is not intended to diagnose infection nor to guide or monitor treatment. Performed at [facility], [place] [address], [place], [place] [address]          Radiology Studies: No results found.      Scheduled Meds:  lactulose  20 g Oral BID   pantoprazole  40 mg Oral BID   rifaximin  550 mg Oral BID   spironolactone  50 mg Oral Daily   Continuous Infusions:  sodium chloride 100 mL/hr at 06/28/21 1217     LOS: 2 days    Time spent: 35 minutes.     Elmarie Shiley, MD [facility]   If 7PM-7AM, please contact night-coverage [email or web address]  06/28/2021, 2:41 PM

## 2021-06-28 NOTE — Progress Notes (Addendum)
Daily Progress Note  Hospital Day: 4  Chief Complaint: altered mental status, chronic liver disease  Brief History Steve Watson is a 81 y.o. male with a pmh not limited to GERD and HTN. Followed by Hematology for thrombocytopenia. Prior imaging suggested hepatic steatosis / splenomegaly. 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  Currently admitted wit AMS / confusion felt to be hepatic encephalopathy  Assessment   Cirrhosis with portal HTN. He has grade I esophageal varices, splenomegaly,  hepatic encephalopathy.  MELD 14 on admission.  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   Mild coagulopathy. INR 1.4 Thrombocytopenia , stable at 56.    Gastritis / PUD. Two non-bleeding duodenal ulcers on EGD  Chronic macrocytic anemia. Hgb stable at 10.5 Iron studies suggests chronic disease though ferritin is in low normal range at 28.  Normal folate  Plan   Protonix 40 mg PO BID for 8 weeks  then reduce to 40 mg daily. Await pathology results. Repeat upper endoscopy in 8 weeks to check healing. Continue Lactulose. Had 3 BMs during the night and already two this am ( nurses cleaning him now). Bicarb down overnight. Will decrease dose to 20 mg BID, mentation is improving. Continue Xifaxan On aldactone 50 mg daily for lower extremity edema. No ascites on [place] . Lasix not started. Cr normal. 0.7 On heart healthy diet, will make sure it includes 2 grams sodium restriction HCC screening. No definite liver lesion on [place] but exam was compromised. Will need repeat imaging at some point. Obtain AFP  Subjective   No complaints. Ate breakfast. Had two liquids BMs this am and three during the night per staff. Wife says mentation continues to improve  Objective   Endoscopic studies:  -Grade I esophageal varices. - Z-line regular, 40 cm from the incisors. -  Gastritis. Biopsied --Non bleeding duodenal ulcers. No stigmata of recent bleeding  Imaging:  CT Head Wo Contrast  Result Date: 06/25/2021 CLINICAL DATA:  81 year old male with difficulty walking, weakness, confusion, jaundiced. EXAM: CT HEAD WITHOUT CONTRAST TECHNIQUE: Contiguous axial images were obtained from the base of the skull through the vertex without intravenous contrast. RADIATION DOSE REDUCTION: This exam was performed according to the departmental dose-optimization program which includes automated exposure control, adjustment of the mA and/or kV according to patient size and/or use of iterative reconstruction technique. COMPARISON:  Head CT 07/07/2013. FINDINGS: Brain: Some generalized cerebral volume loss since 2015. No midline shift, ventriculomegaly, mass effect, evidence of mass lesion, intracranial hemorrhage or evidence of cortically based acute infarction. Patchy bilateral cerebral white matter hypodensity is increased and appears moderate for age. Deep gray nuclei, brainstem and cerebellum appear spared. No cortical encephalomalacia identified. Vascular: Calcified atherosclerosis at the skull base. No suspicious intracranial vascular hyperdensity. Skull: No acute or suspicious osseous lesion identified. Sinuses/Orbits: Visualized paranasal sinuses and mastoids are stable and well aerated. Other: Visualized orbits and scalp soft tissues are within normal limits. IMPRESSION: 1. No acute intracranial abnormality. 2. Generalized cerebral volume loss since 2015, and moderate for age cerebral white matter changes, most commonly due to chronic small vessel disease. Electronically Signed   By: [name]   On: 06/25/2021 09:18   DG Chest Portable 1 View  Result Date: 06/25/2021 CLINICAL DATA:  Provided history: Altered mental status. Additional history provided: Difficulty walking, decreased strength, increased confusion. Jaundice. EXAM: PORTABLE CHEST 1  VIEW COMPARISON:  Prior chest  radiographs 08/19/2020 and earlier. FINDINGS: Heart size within normal limits. Subtle ill-defined opacity within the bilateral left lung base, favored to reflect atelectasis. No definite airspace consolidation. No evidence of pleural effusion or pneumothorax. No acute bony abnormality identified. IMPRESSION: Subtle ill-defined opacity within the lateral left lung base, favored to reflect atelectasis. Otherwise, no evidence of acute cardiopulmonary abnormality. Electronically Signed   By: [name]   On: 06/25/2021 09:19   US Abdomen Limited RUQ (LIVER/GB)  Result Date: 06/25/2021 CLINICAL DATA:  Cirrhosis. EXAM: ULTRASOUND ABDOMEN LIMITED RIGHT UPPER QUADRANT COMPARISON:  Abdominal ultrasound 04/09/2019; MRI abdomen 05/05/2019 FINDINGS: Gallbladder: No gallstones or wall thickening visualized. No sonographic Murphy sign noted by sonographer. Common bile duct: Diameter: 2 mm, within normal limits. Liver: Mildly nodular liver contour. Heterogeneous, course hepatic echotexture. The patient was unable to lie in left lateral decubitus position, limiting visualization of the right and left hepatic lobes. Overlying bowel-gas and patient confusion also limited this examination. No definite focal liver lesion is visualized. Portal vein is patent on color Doppler imaging with normal direction of blood flow towards the liver. Other: None. IMPRESSION: 1. Normal appearance of the gallbladder. 2. Mildly nodular liver contour and heterogeneous, coarse liver echogenicity. No definite liver lesion is identified. Electronically Signed   By: [name]   On: 06/25/2021 12:19    Lab Results: Recent Labs    06/26/21 0606 06/27/21 0529 06/28/21 0804  WBC 2.4* 3.2* 3.7*  HGB 9.7* 10.7* 10.5*  HCT 29.3* 32.0* 31.6*  PLT 52* 53* 55*   BMET Recent Labs    06/26/21 0606 06/27/21 0529 06/28/21 0804  NA 142 143 143  K 3.3* 4.1 3.9  CL 119* 120* 122*  CO2 20* 20* 17*  GLUCOSE 89 99 98  BUN 16 15 13    CREATININE 0.70 0.72 0.70  CALCIUM 8.1* 8.3* 8.0*   LFT Recent Labs    06/26/21 1653 06/27/21 0529  PROT 5.1* 4.8*  ALBUMIN 2.8* 2.5*  AST 34 33  ALT 22 21  ALKPHOS 66 68  BILITOT 1.8* 2.4*  BILIDIR 0.5*  --   IBILI 1.3*  --    PT/INR Recent Labs    06/25/21 1123  LABPROT 17.4*  INR 1.4*     Scheduled inpatient medications:   lactulose  30 g Oral TID   pantoprazole  40 mg Oral BID   rifaximin  550 mg Oral BID   spironolactone  50 mg Oral Daily   Continuous inpatient infusions:  PRN inpatient medications: prochlorperazine  Vital signs in last 24 hours: Temp:  [97.7 F (36.5 C)-100.1 F (37.8 C)] 97.7 F (36.5 C) (06/05 0526) Pulse Rate:  [80-96] 93 (06/05 0526) Resp:  [16-23] 18 (06/05 0526) BP: (110-151)/(64-78) 142/78 (06/05 0526) SpO2:  [99 %-100 %] 100 % (06/05 0526) Last BM Date : 06/28/21  Intake/Output Summary (Last 24 hours) at 06/28/2021 1011 Last data filed at 06/28/2021 0600 Gross per 24 hour  Intake 550 ml  Output 700 ml  Net -150 ml    Physical Exam:  General: Alert male in NAD Heart:  Regular rate and rhythm, 2- 3 +  BLE edema. Pulmonary: Normal respiratory effort Abdomen: Soft, nondistended, nontender. Normal bowel sounds.  Neurologic: Alert and oriented to person and place. Some confusion about the time. Mild asterixis.  Psych: Pleasant. Cooperative.    Intake/Output from previous day: 06/04 0701 - 06/05 0700 In: 550 [P.O.:150; I.V.:400] Out: 700 [Urine:700] Intake/Output this shift:  No intake/output data recorded.   Principal Problem:   Hepatic encephalopathy (HCC) Active Problems:   Gastroesophageal reflux disease without esophagitis   Thrombocytopenia (HCC)   Leukopenia   Type 2 diabetes mellitus (HCC)   Hypertension   Normocytic anemia   Prolonged QT interval   Non-alcoholic micronodular cirrhosis of liver (HCC)   Gastritis and gastroduodenitis   Secondary esophageal varices without bleeding ([place])   Duodenal  ulcer     LOS: 2 days   Tye Savoy ,NP 06/28/2021, 10:11 AM     I have taken an interval history, thoroughly reviewed the chart and examined the patient. I agree with the Advanced Practitioner's note, impression and recommendations, and have recorded additional findings, impressions and recommendations below. I performed a substantive portion of this encounter (>50% time spent), including a complete performance of the medical decision making.  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  Additional serologic work-up pending. No ascites on imaging, minimal peripheral edema, discontinue spironolactone.  2 g daily sodium restriction, and the need for diuretics can be readdressed as an outpatient depending on his overall clinical picture.  Hepatic encephalopathy has main presenting symptom of the cirrhosis, he has metabolic acidosis but I agree is likely related to diarrhea from lactulose.  Dose has been decreased and rifaximin will be continued.  Chronic macrocytic anemia with a likely component of chronic GI blood loss as well from duodenal ulcers.  Biopsies for H. pylori pending, patient on twice daily PPI.  We will follow primarily for hepatic encephalopathy management, then plan outpatient office follow-up with Dr. Bryan Lemma when patient is ready for discharge.   Nelida Meuse III [phone]

## 2021-06-29 DIAGNOSIS — K7682 Hepatic encephalopathy: Secondary | ICD-10-CM | POA: Diagnosis not present

## 2021-06-29 LAB — BASIC METABOLIC PANEL
Anion gap: 3 — ABNORMAL LOW (ref 5–15)
BUN: 13 mg/dL (ref 8–23)
CO2: 18 mmol/L — ABNORMAL LOW (ref 22–32)
Calcium: 7.6 mg/dL — ABNORMAL LOW (ref 8.9–10.3)
Chloride: 118 mmol/L — ABNORMAL HIGH (ref 98–111)
Creatinine, Ser: 0.72 mg/dL (ref 0.61–1.24)
GFR, Estimated: >60 mL/min (ref >60)
Glucose, Bld: 86 mg/dL (ref 70–99)
Potassium: 3.7 mmol/L (ref 3.5–5.1)
Sodium: 139 mmol/L (ref 135–145)

## 2021-06-29 LAB — GLUCOSE, CAPILLARY: Glucose-Capillary: 85 mg/dL (ref 70–99)

## 2021-06-29 LAB — AFP TUMOR MARKER: AFP, Serum, Tumor Marker: 2.1 ng/mL (ref 0.0–8.4)

## 2021-06-29 MED ORDER — LACTULOSE 10 GM/15ML PO SOLN: 20.0000 g | Freq: Two times a day (BID) | ORAL | 0 refills | Status: DC

## 2021-06-29 MED ORDER — PANTOPRAZOLE SODIUM 40 MG PO TBEC: 40.0000 mg | DELAYED_RELEASE_TABLET | Freq: Two times a day (BID) | ORAL | 1 refills | Status: DC

## 2021-06-29 MED ORDER — RIFAXIMIN 550 MG PO TABS: 550.0000 mg | ORAL_TABLET | Freq: Two times a day (BID) | ORAL | 0 refills | Status: DC

## 2021-06-29 NOTE — Progress Notes (Signed)
Nursing Discharge Note   Admit Date: 06/25/2021  Discharge date: 06/29/2021   Steve Watson is to be discharged home per MD order.  AVS completed. Reviewed with patient and family at bedside. Highlighted copy provided for patient to take home.  Patient and wife Steve Watson able to verbalize understanding of discharge instructions. PIV removed. Patient stable upon discharge.   Discharge Instructions     Diet - low sodium heart healthy   Complete by: As directed    Increase activity slowly   Complete by: As directed        Allergies as of 06/29/2021   No Known Allergies      Medication List     STOP taking these medications    hydrochlorothiazide 12.5 MG capsule Commonly known as: MICROZIDE       TAKE these medications    aspirin 81 MG tablet Take 81 mg by mouth daily.   Cholecalciferol 50 MCG (2000 UT) Tabs Take 1 tablet (2,000 Units total) by mouth daily at 12 noon.   lactulose 10 GM/15ML solution Commonly known as: CHRONULAC Take 30 mLs (20 g total) by mouth 2 (two) times daily.   omeprazole 10 MG capsule Commonly known as: PRILOSEC Take 10 mg by mouth daily as needed (heatrt burn).   pantoprazole 40 MG tablet Commonly known as: PROTONIX Take 1 tablet (40 mg total) by mouth 2 (two) times daily.   rifaximin 550 MG Tabs tablet Commonly known as: XIFAXAN Take 1 tablet (550 mg total) by mouth 2 (two) times daily.   Vision Vitamins Tabs Take 2 tablets by mouth daily.   Zinc 100 MG Tabs Take 100 mg by mouth daily.         Discharge Instructions/ Education: Discharge instructions given to patient/family with verbalized understanding. Discharge education completed with patient/family including: follow up instructions, medication list, discharge activities, and limitations if indicated. Patient instructed to return to Emergency Department, call 911, or call MD for any changes in condition.  Patient escorted via wheelchair to lobby and discharged home via private  automobile.

## 2021-06-29 NOTE — Evaluation (Signed)
Occupational Therapy Evaluation Patient Details Name: Steve Watson MRN: [id number] DOB: [birth year] Today's Date: 06/29/2021   History of Present Illness 81 year old with past medical history significant for varicella-zoster, GERD, leukopenia, diabetes type 2, hypertension, presented due to altered mental status for the past few days associated with weakness and hypersomnolence.  He was found to have hepatic encephalopathy.   Clinical Impression   Patient is a 81 year old male who was admitted for above. Patient was living at home with wife prior level. Currently, patient is min guard for all standing tasks with  poor safety awareness noted during session. Patient was mod A for LB dressing with inability to problem solve how to get R sock on with increased difficulty noted when compared to L side. Patient would continue to benefit from skilled OT services at this time while admitted and after d/c to address noted deficits in order to improve overall safety and independence in ADLs.       Recommendations for follow up therapy are one component of a multi-disciplinary discharge planning process, led by the attending physician.  Recommendations may be updated based on patient status, additional functional criteria and insurance authorization.   Follow Up Recommendations  Home health OT    Assistance Recommended at Discharge Frequent or constant Supervision/Assistance  Patient can return home with the following A little help with walking and/or transfers;A little help with bathing/dressing/bathroom;Assistance with cooking/housework;Direct supervision/assist for financial management;Assist for transportation;Help with stairs or ramp for entrance;Direct supervision/assist for medications management    Functional Status Assessment  Patient has had a recent decline in their functional status and demonstrates the ability to make significant improvements in function in a reasonable and predictable  amount of time.  Equipment Recommendations  None recommended by OT    Recommendations for Other Services       Precautions / Restrictions Precautions Precautions: Fall Restrictions Weight Bearing Restrictions: No      Mobility Bed Mobility Overal bed mobility: Needs Assistance Bed Mobility: Supine to Sit, Sit to Supine     Supine to sit: Min assist     General bed mobility comments: assist to elevate trunk,    Transfers                          Balance Overall balance assessment: Needs assistance   Sitting balance-Leahy Scale: Good     Standing balance support: Reliant on assistive device for balance, During functional activity Standing balance-Leahy Scale: Poor                             ADL either performed or assessed with clinical judgement   ADL Overall ADL's : Needs assistance/impaired Eating/Feeding: Set up;Sitting   Grooming: Wash/dry face;Sitting;Min guard;Wash/dry hands Grooming Details (indicate cue type and reason): with education to walk closer to sink prior to attempting to lean and complete hand hygiene tasks. Upper Body Bathing: Minimal assistance;Sitting   Lower Body Bathing: Moderate assistance;Sitting/lateral leans   Upper Body Dressing : Minimal assistance;Sitting   Lower Body Dressing: Moderate assistance;Sit to/from stand;Sitting/lateral leans Lower Body Dressing Details (indicate cue type and reason): patient was able to don L sock sitting EOB with no LOB. patient had increased difficulty with keeping RLE up near lap to don sock with patient unable to problem solve how to get sock unstuck from fourth toe to get scok pulled onto foot with multilple attempts removing and putting back on  with similar result. patient needed min A and education on complete task wth verbal cues not enough to complete task. Toilet Transfer: Minimal assistance;Rolling walker (2 wheels) Toilet Transfer Details (indicate cue type and reason):  with increased time with cues to keep walker close and noted moments of unsteadiness. Toileting- Clothing Manipulation and Hygiene: Minimal assistance;Sit to/from stand Toileting - Clothing Manipulation Details (indicate cue type and reason): poor safety awareness.             Vision Patient Visual Report: No change from baseline       Perception     Praxis      Pertinent Vitals/Pain Pain Assessment Pain Assessment: No/denies pain     Hand Dominance     Extremity/Trunk Assessment Upper Extremity Assessment Upper Extremity Assessment: Generalized weakness   Lower Extremity Assessment Lower Extremity Assessment: Defer to PT evaluation   Cervical / Trunk Assessment Cervical / Trunk Assessment: Normal   Communication Communication Communication: No difficulties   Cognition Arousal/Alertness: Awake/alert Behavior During Therapy: Flat affect Overall Cognitive Status: Impaired/Different from baseline Area of Impairment: Orientation, Following commands, Problem solving, Safety/judgement                 Orientation Level: Disoriented to, Time     Following Commands: Follows one step commands with increased time, Follows multi-step commands with increased time Safety/Judgement: Decreased awareness of safety   Problem Solving: Slow processing, Difficulty sequencing       General Comments       Exercises     Shoulder Instructions      Home Living Family/patient expects to be discharged to:: Private residence Living Arrangements: Spouse/significant other Available Help at Discharge: Family;Available 24 hours/day Type of Home: House       Home Layout: One level               Home Equipment: None          Prior Functioning/Environment Prior Level of Function : Independent/Modified Independent                        OT Problem List: Decreased activity tolerance;Impaired balance (sitting and/or standing);Decreased safety  awareness;Decreased knowledge of precautions      OT Treatment/Interventions: Self-care/ADL training;Therapeutic exercise;Neuromuscular education;Energy conservation;DME and/or AE instruction;Therapeutic activities;Balance training;Patient/family education    OT Goals(Current goals can be found in the care plan section) Acute Rehab OT Goals Patient Stated Goal: to go home today OT Goal Formulation: Patient unable to participate in goal setting Time For Goal Achievement: 07/13/21 Potential to Achieve Goals: Good  OT Frequency: Min 2X/week    Co-evaluation              AM-PAC OT "6 Clicks" Daily Activity     Outcome Measure Help from another person eating meals?: A Little Help from another person taking care of personal grooming?: A Little Help from another person toileting, which includes using toliet, bedpan, or urinal?: A Little Help from another person bathing (including washing, rinsing, drying)?: A Lot Help from another person to put on and taking off regular upper body clothing?: A Little Help from another person to put on and taking off regular lower body clothing?: A Lot 6 Click Score: 16   End of Session Equipment Utilized During Treatment: Gait belt;Rolling walker (2 wheels)  Activity Tolerance: Patient tolerated treatment well Patient left: in chair;with call bell/phone within reach;with chair alarm set  OT Visit Diagnosis: Unsteadiness on feet (R26.81);Other abnormalities of gait  and mobility (R26.89)                Time: 0811-0828 OT Time Calculation (min): 17 min Charges:  OT General Charges $OT Visit: 1 Visit OT Evaluation $OT Eval Moderate Complexity: 1 Mod  Jackelyn Poling OTR/L, MS Acute Rehabilitation Department Office# [phone] Pager# [phone]   Marcellina Millin 06/29/2021, 10:04 AM

## 2021-06-29 NOTE — Discharge Summary (Signed)
Physician Discharge Summary   Patient: Steve Watson MRN: [id number] DOB: 10-07-1940  Admit date:     06/25/2021  Discharge date: 06/29/21  Discharge Physician: Elmarie Shiley   PCP: Mackie Pai, PA-C   Recommendations at discharge:   Needs to follow up with GI for further care of cirrhosis.  Follow up electrolytes, consider resumption of spironolactone.   Discharge Diagnoses: Principal Problem:   Hepatic encephalopathy ([place]) Active Problems:   Gastroesophageal reflux disease without esophagitis   Thrombocytopenia (H[facility])   Leukopenia   Type 2 diabetes mellitus (H[facility])   Hypertension   Normocytic anemia   Prolonged QT interval   Non-alcoholic micronodular cirrhosis of liver ([facility]CC)   Gastritis and gastroduodenitis   Secondary esophageal varices without bleeding ([facility])   Duodenal ulcer  Resolved Problems:   * No resolved hospital problems. *  Hospital Course: 81 year old with past medical history significant for varicella-zoster, GERD, leukopenia, diabetes type 2, hypertension, presented due to altered mental status for the past few days associated with weakness and hypersomnolence.  He was found to have hepatic encephalopathy.    Assessment and Plan:   1-Acute Hepatic Encephalopathy:  In setting cirrhosis liver.  Started on lactulose. Continue.  Ammonia 150---100 ---95-125 Discussed with Dr Bryan Lemma-- started  rifaximin.  Improving.  Having diarrhea, lactulose reduce to BID>   Stable for discharge.   2-Liver Cirrhosis:  ? NASH/.  INR 1.4 Hepatitis panel. Serology for hepatitis autoimmune evaluation ordered and pending Bili elevation since 2019 (1.3--2.2) RUQ [place]; Mildly nodular liver contour and heterogeneous, coarse liver echogenicity. No definite liver lesion is identified. GI consulted. Endoscopy:non bleeding duodenal ulcer, gastritis, grade 1 esophageal varices.  Hold spironolactone die to diarrhea and metabolic acidosis.  Hold HCTZ, might need lasix.     3-Hypokalemia:  Replaced.    4-Anemia: labs consistent with anemia of chronic diseases.  Iron 138, Folate 12, B12 395.  Hb stable.    5-Metabolic acidosis; in setting diarrhea. Lactulose reduce. Received fluids.  Holding lactulose.    Pyuria:  urine culture no growth to date.  Stop ceftriaxone.    Duodenal ulcer, gastritis;  PPI> BID for 8 weeks then daily.  Will need follow up endoscopy   Thrombocytopenia. Leukopenia. Chronic H/O splenomegaly.  Follow with Dr Marin Olp.  Follow trend. Stable.    Diabetes Type 2:  A1c at 4.7 two month ago.  On Diet.    HTN;  Hold HCTZ. Spironolactone on hold due to diarrhea.    Prolong QT; replete electrolytes.           Consultants: GI Procedures performed: Endoscopy  Disposition: Home Diet recommendation:  Discharge Diet Orders (From admission, onward)     Start     Ordered   06/29/21 0000  Diet - low sodium heart healthy        06/29/21 1128           Cardiac diet DISCHARGE MEDICATION: Allergies as of 06/29/2021   No Known Allergies      Medication List     STOP taking these medications    hydrochlorothiazide 12.5 MG capsule Commonly known as: MICROZIDE       TAKE these medications    aspirin 81 MG tablet Take 81 mg by mouth daily.   Cholecalciferol 50 MCG (2000 UT) Tabs Take 1 tablet (2,000 Units total) by mouth daily at 12 noon.   lactulose 10 GM/15ML solution Commonly known as: CHRONULAC Take 30 mLs (20 g total) by mouth 2 (two) times daily.  omeprazole 10 MG capsule Commonly known as: PRILOSEC Take 10 mg by mouth daily as needed (heatrt burn).   pantoprazole 40 MG tablet Commonly known as: PROTONIX Take 1 tablet (40 mg total) by mouth 2 (two) times daily.   rifaximin 550 MG Tabs tablet Commonly known as: XIFAXAN Take 1 tablet (550 mg total) by mouth 2 (two) times daily.   Vision Vitamins Tabs Take 2 tablets by mouth daily.   Zinc 100 MG Tabs Take 100 mg by mouth daily.         Discharge Exam: Filed Weights   06/25/21 0851  Weight: 86.2 kg   General; NAD Lung; CTA  Condition at discharge: stable  The results of significant diagnostics from this hospitalization (including imaging, microbiology, ancillary and laboratory) are listed below for reference.   Imaging Studies: CT Head Wo Contrast  Result Date: 06/25/2021 CLINICAL DATA:  81 year old male with difficulty walking, weakness, confusion, jaundiced. EXAM: CT HEAD WITHOUT CONTRAST TECHNIQUE: Contiguous axial images were obtained from the base of the skull through the vertex without intravenous contrast. RADIATION DOSE REDUCTION: This exam was performed according to the departmental dose-optimization program which includes automated exposure control, adjustment of the mA and/or kV according to patient size and/or use of iterative reconstruction technique. COMPARISON:  Head CT 07/07/2013. FINDINGS: Brain: Some generalized cerebral volume loss since 2015. No midline shift, ventriculomegaly, mass effect, evidence of mass lesion, intracranial hemorrhage or evidence of cortically based acute infarction. Patchy bilateral cerebral white matter hypodensity is increased and appears moderate for age. Deep gray nuclei, brainstem and cerebellum appear spared. No cortical encephalomalacia identified. Vascular: Calcified atherosclerosis at the skull base. No suspicious intracranial vascular hyperdensity. Skull: No acute or suspicious osseous lesion identified. Sinuses/Orbits: Visualized paranasal sinuses and mastoids are stable and well aerated. Other: Visualized orbits and scalp soft tissues are within normal limits. IMPRESSION: 1. No acute intracranial abnormality. 2. Generalized cerebral volume loss since 2015, and moderate for age cerebral white matter changes, most commonly due to chronic small vessel disease. Electronically Signed   By: [name]   On: 06/25/2021 09:18   DG Chest Portable 1 View  Result Date:  06/25/2021 CLINICAL DATA:  Provided history: Altered mental status. Additional history provided: Difficulty walking, decreased strength, increased confusion. Jaundice. EXAM: PORTABLE CHEST 1 VIEW COMPARISON:  Prior chest radiographs 08/19/2020 and earlier. FINDINGS: Heart size within normal limits. Subtle ill-defined opacity within the bilateral left lung base, favored to reflect atelectasis. No definite airspace consolidation. No evidence of pleural effusion or pneumothorax. No acute bony abnormality identified. IMPRESSION: Subtle ill-defined opacity within the lateral left lung base, favored to reflect atelectasis. Otherwise, no evidence of acute cardiopulmonary abnormality. Electronically Signed   By: [name]   On: 06/25/2021 09:19   US Abdomen Limited RUQ (LIVER/GB)  Result Date: 06/25/2021 CLINICAL DATA:  Cirrhosis. EXAM: ULTRASOUND ABDOMEN LIMITED RIGHT UPPER QUADRANT COMPARISON:  Abdominal ultrasound 04/09/2019; MRI abdomen 05/05/2019 FINDINGS: Gallbladder: No gallstones or wall thickening visualized. No sonographic Murphy sign noted by sonographer. Common bile duct: Diameter: 2 mm, within normal limits. Liver: Mildly nodular liver contour. Heterogeneous, course hepatic echotexture. The patient was unable to lie in left lateral decubitus position, limiting visualization of the right and left hepatic lobes. Overlying bowel-gas and patient confusion also limited this examination. No definite focal liver lesion is visualized. Portal vein is patent on color Doppler imaging with normal direction of blood flow towards the liver. Other: None. IMPRESSION: 1. Normal appearance of the gallbladder. 2. Mildly  nodular liver contour and heterogeneous, coarse liver echogenicity. No definite liver lesion is identified. Electronically Signed   By: [name]   On: 06/25/2021 12:19    Microbiology: Results for orders placed or performed during the hospital encounter of 06/25/21  Urine Culture      Status: None   Collection Time: 06/26/21 11:14 AM   Specimen: Urine, Clean Catch  Result Value Ref Range Status   Specimen Description   Final    URINE, CLEAN CATCH Performed at [facility], [place] [address] [place], [place] [address]    Special Requests   Final    NONE Performed at [facility], [place] [address], [place], [place] [address]    Culture   Final    NO GROWTH Performed at [facility], [place] [address] [place], [place] [address]    Report Status 06/27/2021 FINAL  Final  Surgical pcr screen     Status: Abnormal   Collection Time: 06/27/21  2:09 AM   Specimen: Nasal Mucosa; Nasal Swab  Result Value Ref Range Status   MRSA, PCR NEGATIVE NEGATIVE Final   Staphylococcus aureus POSITIVE (A) NEGATIVE Final    Comment: (NOTE) The Xpert SA Assay (FDA approved for NASAL specimens in patients 17 years of age and older), is one component of a comprehensive surveillance program. It is not intended to diagnose infection nor to guide or monitor treatment. Performed at [facility], [place] [address], [place],  [address]     Labs: CBC: Recent Labs  Lab 06/25/21 [phone] 06/25/21 0925 06/26/21 0606 06/27/21 0529 06/28/21 0804  WBC 4.7 4.2 2.4* 3.2* 3.7*  NEUTROABS 3.0 3.0  --   --   --   HGB 12.0* 11.7* 9.7* 10.7* 10.5*  HCT 35.1* 32.9* 29.3* 32.0* 31.6*  MCV 99.2 96.8 102.1* 101.9* 104.3*  PLT 81* 65* 52* 53* 55*   Basic Metabolic Panel: Recent Labs  Lab 06/25/21 0925 06/26/21 0606 06/27/21 0529 06/28/21 0804 06/29/21 0539  NA 145 142 143 143 139  K 3.6 3.3* 4.1 3.9 3.7  CL 120* 119* 120* 122* 118*  CO2 20* 20* 20* 17* 18*  GLUCOSE 113* 89 99 98 86  BUN 17 16 15 13 13   CREATININE 0.72 0.70 0.72 0.70 0.72  CALCIUM 8.9 8.1* 8.3* 8.0* 7.6*   Liver Function Tests: Recent Labs  Lab 06/25/21 0741 06/25/21 0925 06/26/21 0606 06/26/21 1653 06/27/21 0529  AST 36 45* 33 34 33  ALT 23 26 21 22 21    ALKPHOS 80 81 62 66 68  BILITOT 2.6* 2.4* 2.9* 1.8* 2.4*  PROT 5.9* 5.4* 4.8* 5.1* 4.8*  ALBUMIN 2.8* 2.4* 2.6* 2.8* 2.5*   CBG: Recent Labs  Lab 06/28/21 0757 06/28/21 1203 06/28/21 1713 06/28/21 2126 06/29/21 0756  GLUCAP 86 108* 107* 123* 85    Discharge time spent: greater than 30 minutes.  Signed: Elmarie Shiley, MD [facility] 06/29/2021

## 2021-06-29 NOTE — Progress Notes (Cosign Needed)
Daily Progress Note  Hospital Day: 5  Chief Complaint: altered mental status, chronic liver disease  Brief History Steve Watson is a 81 y.o. male with a pmh not limited to GERD and HTN. Followed by Hematology for thrombocytopenia. Prior imaging suggested hepatic steatosis / splenomegaly. 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 He has grade I esophageal varices, splenomegaly,  hepatic encephalopathy.  MELD 14 on admission.  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  Autoimmune markers pending Mild coagulopathy. INR 1.4 Thrombocytopenia , platelets stable.   HE:  mentation continues to improve HCC screening. No definite liver lesion on [place] but exam was compromised. Will need repeat imaging at some point. AFP normal at 2.1  # Metabolic acidosis Was having excessive loose stools on Lactulose. Does reduced yesterday and diuretics stopped.  No stools yet this am.  Mild improvement in bicarb overnight.    # Gastritis / PUD ( found on varices screening EGD this admission). Two non-bleeding duodenal ulcers on EGD. Biopsies pending   # Chronic macrocytic anemia. Hgb stable at 10.5.  Iron studies suggests chronic disease though ferritin is in low normal range at 28.  Normal folate. Likely has some chronic GI blood loss with history of PUD   Plan   Protonix 40 mg PO BID for 8 weeks  then reduce to 40 mg daily. Await pathology results. Repeat upper endoscopy in 8 weeks to check healing. Continue Xifaxan  Continue Lactulose.Dose reduced yesterday to 20 grams BID due to excessive BMs and worsening metabolic acidosis. Bicarb still ow at 18 but improved from 17 overnight.  Continue 2 gram sodium  restricted diet.   Subjective   Up in bedside chair. No complains. No BMs today. No abdominal pain   Objective   Endoscopic studies:  EGD 06/27/21 -Grade I esophageal varices. - Z-line regular, 40 cm from the incisors. - Gastritis. Biopsied --Non bleeding duodenal ulcers. No stigmata of recent bleeding  Imaging:  US Abdomen Limited RUQ (LIVER/GB)  Result Date: 06/25/2021 CLINICAL DATA:  Cirrhosis. EXAM: ULTRASOUND ABDOMEN LIMITED RIGHT UPPER QUADRANT COMPARISON:  Abdominal ultrasound 04/09/2019; MRI abdomen 05/05/2019 FINDINGS: Gallbladder: No gallstones or wall thickening visualized. No sonographic Murphy sign noted by sonographer. Common bile duct: Diameter: 2 mm, within normal limits. Liver: Mildly nodular liver contour. Heterogeneous, course hepatic echotexture. The patient was unable to lie in left lateral decubitus position, limiting visualization of the right and left hepatic lobes. Overlying bowel-gas and patient confusion also limited this examination. No definite focal liver lesion is visualized. Portal vein is patent on color Doppler imaging with normal direction of blood flow towards the liver. Other: None. IMPRESSION: 1. Normal appearance of the gallbladder. 2. Mildly nodular liver contour and heterogeneous, coarse liver echogenicity. No definite liver lesion is identified. Electronically Signed   By: [name]   On: 06/25/2021 12:19    Lab Results: Recent Labs    06/27/21 0529 06/28/21 0804  WBC 3.2* 3.7*  HGB 10.7* 10.5*  HCT 32.0* 31.6*  PLT 53* 55*   BMET Recent Labs    06/27/21 0529 06/28/21 0804 06/29/21 0539  NA 143 143 139  K 4.1 3.9 3.7  CL 120* 122* 118*  CO2 20* 17* 18*  GLUCOSE 99 98 86  BUN 15 13 13   CREATININE 0.72 0.70 0.72  CALCIUM 8.3* 8.0* 7.6*   LFT Recent Labs    06/26/21 1653 06/27/21 0529  PROT 5.1* 4.8*  ALBUMIN 2.8* 2.5*  AST 34 33  ALT 22 21  ALKPHOS 66 68  BILITOT 1.8* 2.4*  BILIDIR 0.5*  --   IBILI 1.3*  --     PT/INR No results for input(s): LABPROT, INR in the last 72 hours.   Scheduled inpatient medications:   lactulose  20 g Oral BID   pantoprazole  40 mg Oral BID   rifaximin  550 mg Oral BID   Continuous inpatient infusions:  PRN inpatient medications: prochlorperazine  Vital signs in last 24 hours: Temp:  [98.4 F (36.9 C)-98.8 F (37.1 C)] 98.5 F (36.9 C) (06/06 0504) Pulse Rate:  [84-96] 84 (06/06 0504) Resp:  [18-19] 19 (06/05 2110) BP: (113-126)/(62-83) 113/62 (06/06 0504) SpO2:  [100 %] 100 % (06/06 0504) Last BM Date : 06/28/21  Intake/Output Summary (Last 24 hours) at 06/29/2021 1019 Last data filed at 06/29/2021 0600 Gross per 24 hour  Intake 471.51 ml  Output 950 ml  Net -478.49 ml     Physical Exam:  General: Alert male in NAD Heart:  Regular rate and rhythm, 2-3 + BLE edema.  Pulmonary: Normal respiratory effort. Decreased breath sounds RLL Abdomen: Soft, nondistended, nontender. Normal bowel sounds.  Neurologic: Alert and oriented. No asterixis Psych: Pleasant. Cooperative.    Intake/Output from previous day: 06/05 0701 - 06/06 0700 In: 471.5 [I.V.:471.5] Out: 950 [Urine:950] Intake/Output this shift: No intake/output data recorded.    Principal Problem:   Hepatic encephalopathy (HCC) Active Problems:   Gastroesophageal reflux disease without esophagitis   Thrombocytopenia (HCC)   Leukopenia   Type 2 diabetes mellitus (HCC)   Hypertension   Normocytic anemia   Prolonged QT interval   Non-alcoholic micronodular cirrhosis of liver (HCC)   Gastritis and gastroduodenitis   Secondary esophageal varices without bleeding ([place])   Duodenal ulcer     LOS: 3 days   Tye Savoy ,NP 06/29/2021, 10:19 AM   I have eviewed the chart and discussed the patient's care plan with the APP, but the patient was discharged home before I was able to see/examine the patient.  I agree with the APP's note, impression and recommendations  Scott E. Candis Schatz,  MD [facility]

## 2021-06-30 ENCOUNTER — Telehealth: Payer: Self-pay | Admitting: Medical

## 2021-06-30 DIAGNOSIS — K7460 Unspecified cirrhosis of liver: Secondary | ICD-10-CM | POA: Diagnosis not present

## 2021-06-30 DIAGNOSIS — K2970 Gastritis, unspecified, without bleeding: Secondary | ICD-10-CM | POA: Diagnosis not present

## 2021-06-30 DIAGNOSIS — D696 Thrombocytopenia, unspecified: Secondary | ICD-10-CM | POA: Diagnosis not present

## 2021-06-30 DIAGNOSIS — E119 Type 2 diabetes mellitus without complications: Secondary | ICD-10-CM | POA: Diagnosis not present

## 2021-06-30 DIAGNOSIS — E876 Hypokalemia: Secondary | ICD-10-CM | POA: Diagnosis not present

## 2021-06-30 DIAGNOSIS — K219 Gastro-esophageal reflux disease without esophagitis: Secondary | ICD-10-CM | POA: Diagnosis not present

## 2021-06-30 DIAGNOSIS — I10 Essential (primary) hypertension: Secondary | ICD-10-CM | POA: Diagnosis not present

## 2021-06-30 DIAGNOSIS — K269 Duodenal ulcer, unspecified as acute or chronic, without hemorrhage or perforation: Secondary | ICD-10-CM | POA: Diagnosis not present

## 2021-06-30 DIAGNOSIS — I8500 Esophageal varices without bleeding: Secondary | ICD-10-CM | POA: Diagnosis not present

## 2021-06-30 DIAGNOSIS — D649 Anemia, unspecified: Secondary | ICD-10-CM | POA: Diagnosis not present

## 2021-06-30 DIAGNOSIS — D7381 Neutropenic splenomegaly: Secondary | ICD-10-CM | POA: Diagnosis not present

## 2021-06-30 DIAGNOSIS — K7682 Hepatic encephalopathy: Secondary | ICD-10-CM | POA: Diagnosis not present

## 2021-06-30 DIAGNOSIS — D72819 Decreased white blood cell count, unspecified: Secondary | ICD-10-CM | POA: Diagnosis not present

## 2021-06-30 LAB — GLIA (IGA/G) + TTG IGA
Antigliadin Abs, IgA: 7 units (ref 0–19)
Gliadin IgG: 3 units (ref 0–19)
Tissue Transglutaminase Ab, IgA: <2 U/mL (ref 0–3)

## 2021-06-30 LAB — ANTI-SMOOTH MUSCLE ANTIBODY, IGG: F-Actin IgG: 7 Units (ref 0–19)

## 2021-06-30 LAB — SURGICAL PATHOLOGY

## 2021-06-30 LAB — MITOCHONDRIAL ANTIBODIES: Mitochondrial M2 Ab, IgG: <20 Units (ref 0.0–20.0)

## 2021-06-30 NOTE — Telephone Encounter (Signed)
Dr. Cathleen Corti,  [facility] pt was recent hospitalized and I think you saw him while hospitalized. He was given rifaximin on DC and notified me thru wife that cost $1500 dollars. Cost concerns and insurance told pt he can take trimethoprim. I have not seen him for follow up yet. Do you agree. If trimethoprim ok what dose would you use/sig?  Thanks for your help.  Mackie Pai, PA-C

## 2021-06-30 NOTE — Telephone Encounter (Signed)
Wife called and lvm to return call

## 2021-06-30 NOTE — Telephone Encounter (Signed)
Patient's wife states she spoke to the insurance and they recommended trimethotrim to be given instead. Please advise.

## 2021-06-30 NOTE — Telephone Encounter (Signed)
Caller/Agency: [facility] Callback Number: [phone] Requesting OT/PT/Skilled Nursing/Social Work/Speech Therapy: nursing Frequency: 1 w 1 / 2 w 1/ 1 w 2

## 2021-06-30 NOTE — Telephone Encounter (Signed)
He was being prescribed rifaximin for new diagnosis of hepatic encephalopathy.  I do not have any experience using trimethoprim for this.  There are some data about using Flagyl, but I do worry about long-term side effects with chronic use.  For now, plan to use lactulose and titrate to 2-4 soft stools daily.  Mickel Baas, could you please look to see if we can get coverage of rifaximin for him?  Looks like he has follow-up with [facility] already scheduled.

## 2021-06-30 NOTE — Telephone Encounter (Signed)
Charlett Nose (spouse DPR OK) called stating that he was prescribed rifaximin when he was in the hospital and that medication was going to cost $2000 for the pt. Charlett Nose was wondering if there were any alternatives that could be called in that don't cost as much and she stated she will check with the insurance company to see if they have alternatives that would be covered.

## 2021-07-01 ENCOUNTER — Telehealth: Payer: Self-pay | Admitting: Medical

## 2021-07-01 NOTE — Telephone Encounter (Signed)
Pt wife called back and made aware

## 2021-07-01 NOTE — Telephone Encounter (Signed)
VO given to [employer]

## 2021-07-01 NOTE — Telephone Encounter (Signed)
VO given.

## 2021-07-01 NOTE — Telephone Encounter (Signed)
Caller/Agency: Mickel Baas [facility]) Callback Number: [phone] Requesting OT/PT/Skilled Nursing/Social Work/Speech Therapy: PT Frequency: 1 w 6 starting next week

## 2021-07-01 NOTE — Telephone Encounter (Signed)
When giving verbal orders , debbie from home health asked about medication made her aware its still being worked on

## 2021-07-02 ENCOUNTER — Other Ambulatory Visit (HOSPITAL_COMMUNITY): Payer: Self-pay

## 2021-07-05 LAB — HEMOCHROMATOSIS DNA-PCR(C282Y,H63D)

## 2021-07-05 NOTE — Telephone Encounter (Signed)
Spoke with patient's wife to verify pt is taking lactulose. Pt's wife stated that pt is currently taking lactulose and is having 2-4 soft stools per day. Also let wife know we could try filling out the [employer] pt assistance program application to see if that would help, and if that doesn't work pt could continue with Lactulose like he is currently doing. Let wife know that application is ready for pick up whenever she is able to come to office. Pt's wife verbalized understanding and had no other concerns at end of call.

## 2021-07-05 NOTE — Telephone Encounter (Signed)
Thank you for looking into what options exist for the rifaximin.  He should be taking the lactulose already, with a goal of 2-4 soft stools daily.  If the rifaximin is cost prohibitive, will plan to continue on with lactulose monotherapy and monitor.  Please confirm that he is taking the lactulose.

## 2021-07-07 ENCOUNTER — Other Ambulatory Visit: Payer: Self-pay

## 2021-07-07 DIAGNOSIS — K2970 Gastritis, unspecified, without bleeding: Secondary | ICD-10-CM

## 2021-07-09 ENCOUNTER — Ambulatory Visit (INDEPENDENT_AMBULATORY_CARE_PROVIDER_SITE_OTHER): Payer: No Typology Code available for payment source | Admitting: Medical

## 2021-07-09 ENCOUNTER — Ambulatory Visit (HOSPITAL_BASED_OUTPATIENT_CLINIC_OR_DEPARTMENT_OTHER)
Admission: RE | Admit: 2021-07-09 | Discharge: 2021-07-09 | Disposition: A | Discharge status: Home / Self Care | Payer: No Typology Code available for payment source | Source: Ambulatory Visit | Attending: Medical | Admitting: Medical

## 2021-07-09 VITALS — BP 117/70 | HR 83 | Resp 18 | Ht 68.0 in | Wt 190.0 lb

## 2021-07-09 DIAGNOSIS — R7989 Other specified abnormal findings of blood chemistry: Secondary | ICD-10-CM

## 2021-07-09 DIAGNOSIS — K269 Duodenal ulcer, unspecified as acute or chronic, without hemorrhage or perforation: Secondary | ICD-10-CM

## 2021-07-09 DIAGNOSIS — E876 Hypokalemia: Secondary | ICD-10-CM | POA: Diagnosis not present

## 2021-07-09 DIAGNOSIS — D696 Thrombocytopenia, unspecified: Secondary | ICD-10-CM | POA: Diagnosis not present

## 2021-07-09 DIAGNOSIS — E119 Type 2 diabetes mellitus without complications: Secondary | ICD-10-CM | POA: Diagnosis not present

## 2021-07-09 DIAGNOSIS — D7381 Neutropenic splenomegaly: Secondary | ICD-10-CM

## 2021-07-09 DIAGNOSIS — K7682 Hepatic encephalopathy: Secondary | ICD-10-CM

## 2021-07-09 DIAGNOSIS — D649 Anemia, unspecified: Secondary | ICD-10-CM | POA: Diagnosis not present

## 2021-07-09 DIAGNOSIS — R600 Localized edema: Secondary | ICD-10-CM | POA: Diagnosis not present

## 2021-07-09 DIAGNOSIS — M7989 Other specified soft tissue disorders: Secondary | ICD-10-CM | POA: Diagnosis not present

## 2021-07-09 LAB — CBC WITH DIFFERENTIAL/PLATELET
Basophils Absolute: 0 10*3/uL (ref 0.0–0.1)
Basophils Relative: 1.8 % (ref 0.0–3.0)
Eosinophils Absolute: 0.1 10*3/uL (ref 0.0–0.7)
Eosinophils Relative: 3.7 % (ref 0.0–5.0)
HCT: 33.2 % — ABNORMAL LOW (ref 39.0–52.0)
Hemoglobin: 11.6 g/dL — ABNORMAL LOW (ref 13.0–17.0)
Lymphocytes Relative: 26 % (ref 12.0–46.0)
Lymphs Abs: 0.7 10*3/uL (ref 0.7–4.0)
MCHC: 34.9 g/dL (ref 30.0–36.0)
MCV: 101.3 fl — ABNORMAL HIGH (ref 78.0–100.0)
Monocytes Absolute: 0.4 10*3/uL (ref 0.1–1.0)
Monocytes Relative: 15.9 % — ABNORMAL HIGH (ref 3.0–12.0)
Neutro Abs: 1.3 10*3/uL — ABNORMAL LOW (ref 1.4–7.7)
Neutrophils Relative %: 52.6 % (ref 43.0–77.0)
Platelets: 67 10*3/uL — ABNORMAL LOW (ref 150.0–400.0)
RBC: 3.27 Mil/uL — ABNORMAL LOW (ref 4.22–5.81)
RDW: 16.5 % — ABNORMAL HIGH (ref 11.5–15.5)
WBC: 2.6 10*3/uL — ABNORMAL LOW (ref 4.0–10.5)

## 2021-07-09 LAB — COMPREHENSIVE METABOLIC PANEL
ALT: 28 U/L (ref 0–53)
AST: 51 U/L — ABNORMAL HIGH (ref 0–37)
Albumin: 2.8 g/dL — ABNORMAL LOW (ref 3.5–5.2)
Alkaline Phosphatase: 77 U/L (ref 39–117)
BUN: 15 mg/dL (ref 6–23)
CO2: 26 mEq/L (ref 19–32)
Calcium: 8.6 mg/dL (ref 8.4–10.5)
Chloride: 107 mEq/L (ref 96–112)
Creatinine, Ser: 0.9 mg/dL (ref 0.40–1.50)
GFR: 81.36 mL/min (ref >60.00)
Glucose, Bld: 134 mg/dL — ABNORMAL HIGH (ref 70–99)
Potassium: 4 mEq/L (ref 3.5–5.1)
Sodium: 138 mEq/L (ref 135–145)
Total Bilirubin: 2.2 mg/dL — ABNORMAL HIGH (ref 0.2–1.2)
Total Protein: 5.5 g/dL — ABNORMAL LOW (ref 6.0–8.3)

## 2021-07-09 LAB — AMMONIA: Ammonia: 126 umol/L — ABNORMAL HIGH (ref 11–35)

## 2021-07-09 LAB — BRAIN NATRIURETIC PEPTIDE: Pro B Natriuretic peptide (BNP): 49 pg/mL (ref 0.0–100.0)

## 2021-07-09 NOTE — Patient Instructions (Addendum)
Presently appears clinically stable posthospitalization.  For recent hepatic encephalopathy, low platelets, anemia, cirrhosis, duodenal ulcer, low potassium, pedal edema and esophageal varices we will get a CBC, CMP, ammonia level and chest x-ray.  Continue current medications advised by hospitalist on discharge summary.  Follow-up with Dr. Marin Olp and Dr. Bryan Lemma as scheduled.  If signs and symptoms worsen or change let us know.  Any severe signs symptoms change be seen in emergency department.  Update you on lab results as they come in.  Follow-up in 2 weeks with myself or sooner if needed.

## 2021-07-09 NOTE — Progress Notes (Signed)
Subjective:    Patient ID: Steve Watson, male    DOB: 04-09-1940, 81 y.o.   MRN: [id number]  HPI  Pt in for follow up from the hospital.  Below in " DC summary.  " Admit date:     06/25/2021  Discharge date: 06/29/21  Discharge Physician: Elmarie Shiley    PCP: Mackie Pai, PA-C    Recommendations at discharge:    Needs to follow up with GI for further care of cirrhosis.  Follow up electrolytes, consider resumption of spironolactone.    Discharge Diagnoses: Principal Problem:   Hepatic encephalopathy ([place]) Active Problems:   Gastroesophageal reflux disease without esophagitis   Thrombocytopenia (HCC)   Leukopenia   Type 2 diabetes mellitus (HCC)   Hypertension   Normocytic anemia   Prolonged QT interval   Non-alcoholic micronodular cirrhosis of liver (HCC)   Gastritis and gastroduodenitis   Secondary esophageal varices without bleeding (HCC)   Duodenal ulcer   Resolved Problems:   * No resolved hospital problems. *   Hospital Course: 81 year old with past medical history significant for varicella-zoster, GERD, leukopenia, diabetes type 2, hypertension, presented due to altered mental status for the past few days associated with weakness and hypersomnolence.  He was found to have hepatic encephalopathy.     Assessment and Plan:   1-Acute Hepatic Encephalopathy:  In setting cirrhosis liver.  Started on lactulose. Continue.  Ammonia 150---100 ---95-125 Discussed with Dr Bryan Lemma-- started  rifaximin.  Improving.  Having diarrhea, lactulose reduce to BID>   Stable for discharge.    2-Liver Cirrhosis:  ? NASH/.  INR 1.4 Hepatitis panel. Serology for hepatitis autoimmune evaluation ordered and pending Bili elevation since 2019 (1.3--2.2) RUQ [place]; Mildly nodular liver contour and heterogeneous, coarse liver echogenicity. No definite liver lesion is identified. GI consulted. Endoscopy:non bleeding duodenal ulcer, gastritis, grade 1 esophageal varices.   Hold spironolactone die to diarrhea and metabolic acidosis.  Hold HCTZ, might need lasix.    3-Hypokalemia:  Replaced.    4-Anemia: labs consistent with anemia of chronic diseases.  Iron 138, Folate 12, B12 395.  Hb stable.    5-Metabolic acidosis; in setting diarrhea. Lactulose reduce. Received fluids.  Holding lactulose.    Pyuria:  urine culture no growth to date.  Stop ceftriaxone.    Duodenal ulcer, gastritis;  PPI> BID for 8 weeks then daily.  Will need follow up endoscopy   Thrombocytopenia. Leukopenia. Chronic H/O splenomegaly.  Follow with Dr Marin Olp.  Follow trend. Stable.    Diabetes Type 2:  A1c at 4.7 two month ago.  On Diet.    HTN;  Hold HCTZ. Spironolactone on hold due to diarrhea.    Prolong QT; replete electrolytes.    Pt tells me since dc from hospital he has little worse swelling of both lower ext. 02 sat percentage is 100%. No sob lying supine. No dyspnea on walking. Pt has PT coming out and walking in walking and not short of breath.  Lower ext pedal edema was severe before hospitlaized. Better on dc. Now mild worse but not like before. On review most recent hospitalization no US done of lower ext.  Before hospital admission he was very fatigued and confused before hospital admission.   July 12 th pt is scheduled to see Dr. Cathleen Corti. He will get egd in August. Pt had rx of rifaximin rx'd for hepatic encephalopathy. Cost was initially $2000 dollars. He is now on lactulose.   Review of Systems  Constitutional:  Negative for chills,  fatigue and fever.  Respiratory:  Negative for cough, chest tightness, shortness of breath and wheezing.   Cardiovascular:  Negative for chest pain and palpitations.  Gastrointestinal:  Negative for abdominal pain, constipation, diarrhea and nausea.  Genitourinary:  Negative for dysuria, flank pain and frequency.  Musculoskeletal:  Negative for back pain, myalgias and neck pain.  Skin:  Negative for rash.   Neurological:  Negative for dizziness, numbness and headaches.  Hematological:  Negative for adenopathy. Does not bruise/bleed easily.  Psychiatric/Behavioral:  Negative for behavioral problems, decreased concentration, dysphoric mood and sleep disturbance. The patient is not nervous/anxious.    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     Social History   Socioeconomic History   Marital status: Married    Spouse name: Not on file   Number of children: Not on file   Years of education: Not on file   Highest education level: Not on file  Occupational History   Not on file  Tobacco Use   Smoking status: Never   Smokeless tobacco: Never   Tobacco comments:    NEVER USED TOBACCO  Vaping Use   Vaping Use: Never used  Substance and Sexual Activity   Alcohol use: No   Drug use: No   Sexual activity: Yes  Other Topics Concern   Not on file  Social History Narrative   2 Sons; grandson works in Recruitment consultant in [employer] for 8 years    Enjoys weightlifting    Social Determinants of Health   Financial Resource Strain: Low Risk  (02/24/2020)   Overall Financial Resource Strain (CARDIA)    Difficulty of Paying Living Expenses: Not hard at all  Food Insecurity: No Food Insecurity (02/24/2020)   Hunger Vital Sign    Worried About Running Out of Food in the Last Year: Never true    [place] in the Last Year: Never true  Transportation Needs: No Transportation Needs (02/24/2020)   PRAPARE - Hydrologist (Medical): No    Lack of Transportation (Non-Medical): No  Physical Activity: Sufficiently Active (02/24/2020)   Exercise Vital Sign    Days of Exercise per Week: 3 days    Minutes of Exercise per Session: 50 min  Stress: No Stress Concern Present (02/24/2020)   [place]    Feeling of Stress : Not at all  Social Connections: Moderately Isolated (02/24/2020)   Social Connection and Isolation Panel [NHANES]    Frequency of Communication with Friends and Family: More than three times a week    Frequency of Social Gatherings with Friends and Family: More than three times a week    Attends Religious Services: Never    Marine scientist or Organizations: No    Attends Archivist Meetings: Never    Marital Status: Married  Human resources officer Violence: Not At Risk (02/24/2020)   Humiliation, Afraid, Rape, and Kick questionnaire    Fear of Current or Ex-Partner: No    Emotionally Abused: No    Physically Abused: No    Sexually Abused: No    Past Surgical History:  Procedure Laterality Date   BIOPSY  06/27/2021   Procedure: BIOPSY;  Surgeon: Lavena Bullion, DO;  Location: [facility];  Service: Gastroenterology;;   ESOPHAGOGASTRODUODENOSCOPY (EGD) WITH PROPOFOL N/A 06/27/2021  Procedure: ESOPHAGOGASTRODUODENOSCOPY (EGD) WITH PROPOFOL;  Surgeon: Lavena Bullion, DO;  Location: [facility];  Service: Gastroenterology;  Laterality: N/A;   NO PAST SURGERIES      Family History  Problem Relation Age of Onset   Hypertension Mother    Hypertension Father    Hypertension Brother    Diabetes Brother    Healthy Child    Healthy Grandchild     No Known Allergies  Current Outpatient Medications on File Prior to Visit  Medication Sig Dispense Refill   aspirin 81 MG tablet Take 81 mg by mouth daily.     Cholecalciferol 2000 UNITS TABS Take 1 tablet (2,000 Units total) by mouth daily at 12 noon. 30 each 2   lactulose (CHRONULAC) 10 GM/15ML solution Take 30 mLs (20 g total) by mouth 2 (two) times daily. 236 mL 0   Multiple Vitamins-Minerals (VISION VITAMINS) TABS Take 2 tablets by mouth daily.     omeprazole (PRILOSEC) 10 MG capsule Take 10 mg by mouth daily as needed (heatrt burn).     pantoprazole (PROTONIX) 40 MG tablet Take 1  tablet (40 mg total) by mouth 2 (two) times daily. 60 tablet 1   rifaximin (XIFAXAN) 550 MG TABS tablet Take 1 tablet (550 mg total) by mouth 2 (two) times daily. 42 tablet 0   Zinc 100 MG TABS Take 100 mg by mouth daily.     No current facility-administered medications on file prior to visit.    BP 117/70   Pulse 83   Resp 18   Ht 5' 8"  (1.727 m)   Wt 190 lb (86.2 kg)   SpO2 100%   BMI 28.89 kg/m        Objective:   Physical Exam  General Mental Status- Alert. General Appearance- Not in acute distress.   Skin General: Color- Normal Color. Moisture- Normal Moisture.  Neck Carotid Arteries- Normal color. Moisture- Normal Moisture. No carotid bruits. No JVD.  Chest and Lung Exam Auscultation: Breath Sounds:-Normal.  Cardiovascular Auscultation:Rythm- Regular. Murmurs & Other Heart Sounds:Auscultation of the heart reveals- No Murmurs.  Abdomen Inspection:-Inspeection Normal. Palpation/Percussion:Note:No mass. Palpation and Percussion of the abdomen reveal- Non Tender, Non Distended + BS, no rebound or guarding.   Neurologic Cranial Nerve exam:- CN III-XII intact(No nystagmus), symmetric smile. Strength:- 5/5 equal and symmetric strength both upper and lower extremities.   Lower ext- severe 2-3 + pedal edema. Symmetric with no redness, no wamth or tenderness. Negative homans signs.    Assessment & Plan:   Patient Instructions  Presently appears clinically stable posthospitalization.  For recent hepatic encephalopathy, low platelets, anemia, cirrhosis, duodenal ulcer, low potassium, pedal edema and esophageal varices we will get a CBC, CMP, ammonia level and chest x-ray.  Continue current medications advised by hospitalist on discharge summary.  Follow-up with Dr. Marin Olp and Dr. Bryan Lemma as scheduled.  If signs and symptoms worsen or change let us know.  Any severe signs symptoms change be seen in emergency department.  Update you on lab results as they come  in.  Follow-up in 2 weeks with myself or sooner if needed.   Mackie Pai, PA-C   Time spent with patient today was  45 minutes which consisted of chart review, discussing diagnosis, work up ,treatment, precharting this morning and documentation.

## 2021-07-15 ENCOUNTER — Telehealth: Payer: Self-pay

## 2021-07-15 DIAGNOSIS — E162 Hypoglycemia, unspecified: Secondary | ICD-10-CM | POA: Diagnosis not present

## 2021-07-15 DIAGNOSIS — Z20822 Contact with and (suspected) exposure to covid-19: Secondary | ICD-10-CM | POA: Diagnosis not present

## 2021-07-15 DIAGNOSIS — R9082 White matter disease, unspecified: Secondary | ICD-10-CM | POA: Diagnosis not present

## 2021-07-15 DIAGNOSIS — K7460 Unspecified cirrhosis of liver: Secondary | ICD-10-CM | POA: Diagnosis not present

## 2021-07-15 DIAGNOSIS — R9431 Abnormal electrocardiogram [ECG] [EKG]: Secondary | ICD-10-CM | POA: Diagnosis not present

## 2021-07-15 DIAGNOSIS — R4182 Altered mental status, unspecified: Secondary | ICD-10-CM | POA: Diagnosis not present

## 2021-07-15 DIAGNOSIS — K7682 Hepatic encephalopathy: Secondary | ICD-10-CM | POA: Diagnosis not present

## 2021-07-15 DIAGNOSIS — R410 Disorientation, unspecified: Secondary | ICD-10-CM | POA: Diagnosis not present

## 2021-07-15 DIAGNOSIS — E161 Other hypoglycemia: Secondary | ICD-10-CM | POA: Diagnosis not present

## 2021-07-15 DIAGNOSIS — R531 Weakness: Secondary | ICD-10-CM | POA: Diagnosis not present

## 2021-07-15 DIAGNOSIS — R000 Tachycardia, unspecified: Secondary | ICD-10-CM | POA: Diagnosis not present

## 2021-07-15 DIAGNOSIS — T68XXXA Hypothermia, initial encounter: Secondary | ICD-10-CM | POA: Diagnosis not present

## 2021-07-16 DIAGNOSIS — M7989 Other specified soft tissue disorders: Secondary | ICD-10-CM | POA: Diagnosis not present

## 2021-07-16 DIAGNOSIS — R7989 Other specified abnormal findings of blood chemistry: Secondary | ICD-10-CM | POA: Diagnosis not present

## 2021-07-17 DIAGNOSIS — K7460 Unspecified cirrhosis of liver: Secondary | ICD-10-CM | POA: Diagnosis not present

## 2021-07-17 DIAGNOSIS — S92501A Displaced unspecified fracture of right lesser toe(s), initial encounter for closed fracture: Secondary | ICD-10-CM | POA: Diagnosis not present

## 2021-07-17 DIAGNOSIS — D731 Hypersplenism: Secondary | ICD-10-CM | POA: Diagnosis not present

## 2021-07-17 DIAGNOSIS — K269 Duodenal ulcer, unspecified as acute or chronic, without hemorrhage or perforation: Secondary | ICD-10-CM | POA: Diagnosis not present

## 2021-07-17 DIAGNOSIS — R531 Weakness: Secondary | ICD-10-CM | POA: Diagnosis not present

## 2021-07-17 DIAGNOSIS — E876 Hypokalemia: Secondary | ICD-10-CM | POA: Diagnosis not present

## 2021-07-17 DIAGNOSIS — E119 Type 2 diabetes mellitus without complications: Secondary | ICD-10-CM | POA: Diagnosis not present

## 2021-07-17 DIAGNOSIS — D61818 Other pancytopenia: Secondary | ICD-10-CM | POA: Diagnosis not present

## 2021-07-17 DIAGNOSIS — E8770 Fluid overload, unspecified: Secondary | ICD-10-CM | POA: Diagnosis not present

## 2021-07-17 DIAGNOSIS — I10 Essential (primary) hypertension: Secondary | ICD-10-CM | POA: Diagnosis not present

## 2021-07-17 DIAGNOSIS — E8809 Other disorders of plasma-protein metabolism, not elsewhere classified: Secondary | ICD-10-CM | POA: Diagnosis not present

## 2021-07-17 DIAGNOSIS — I959 Hypotension, unspecified: Secondary | ICD-10-CM | POA: Diagnosis not present

## 2021-07-17 DIAGNOSIS — R188 Other ascites: Secondary | ICD-10-CM | POA: Diagnosis not present

## 2021-07-17 DIAGNOSIS — Z7409 Other reduced mobility: Secondary | ICD-10-CM | POA: Diagnosis not present

## 2021-07-17 DIAGNOSIS — S92911D Unspecified fracture of right toe(s), subsequent encounter for fracture with routine healing: Secondary | ICD-10-CM | POA: Diagnosis not present

## 2021-07-17 DIAGNOSIS — Z66 Do not resuscitate: Secondary | ICD-10-CM | POA: Diagnosis not present

## 2021-07-17 DIAGNOSIS — L89152 Pressure ulcer of sacral region, stage 2: Secondary | ICD-10-CM | POA: Diagnosis not present

## 2021-07-17 DIAGNOSIS — D638 Anemia in other chronic diseases classified elsewhere: Secondary | ICD-10-CM | POA: Diagnosis not present

## 2021-07-17 DIAGNOSIS — R7989 Other specified abnormal findings of blood chemistry: Secondary | ICD-10-CM | POA: Diagnosis not present

## 2021-07-17 DIAGNOSIS — I4581 Long QT syndrome: Secondary | ICD-10-CM | POA: Diagnosis not present

## 2021-07-17 DIAGNOSIS — M7989 Other specified soft tissue disorders: Secondary | ICD-10-CM | POA: Diagnosis not present

## 2021-07-17 DIAGNOSIS — N390 Urinary tract infection, site not specified: Secondary | ICD-10-CM | POA: Diagnosis not present

## 2021-07-17 DIAGNOSIS — W010XXA Fall on same level from slipping, tripping and stumbling without subsequent striking against object, initial encounter: Secondary | ICD-10-CM | POA: Diagnosis not present

## 2021-07-17 DIAGNOSIS — R609 Edema, unspecified: Secondary | ICD-10-CM | POA: Diagnosis not present

## 2021-07-17 DIAGNOSIS — N3001 Acute cystitis with hematuria: Secondary | ICD-10-CM | POA: Diagnosis not present

## 2021-07-17 DIAGNOSIS — S92511A Displaced fracture of proximal phalanx of right lesser toe(s), initial encounter for closed fracture: Secondary | ICD-10-CM | POA: Diagnosis not present

## 2021-07-17 DIAGNOSIS — K7581 Nonalcoholic steatohepatitis (NASH): Secondary | ICD-10-CM | POA: Diagnosis not present

## 2021-07-17 DIAGNOSIS — Y998 Other external cause status: Secondary | ICD-10-CM | POA: Diagnosis not present

## 2021-07-17 DIAGNOSIS — S92911A Unspecified fracture of right toe(s), initial encounter for closed fracture: Secondary | ICD-10-CM | POA: Diagnosis not present

## 2021-07-18 DIAGNOSIS — E876 Hypokalemia: Secondary | ICD-10-CM | POA: Diagnosis not present

## 2021-07-18 DIAGNOSIS — N390 Urinary tract infection, site not specified: Secondary | ICD-10-CM | POA: Diagnosis not present

## 2021-07-18 DIAGNOSIS — M7989 Other specified soft tissue disorders: Secondary | ICD-10-CM | POA: Diagnosis not present

## 2021-07-18 DIAGNOSIS — Z7409 Other reduced mobility: Secondary | ICD-10-CM | POA: Diagnosis not present

## 2021-07-18 DIAGNOSIS — R6510 Systemic inflammatory response syndrome (SIRS) of non-infectious origin without acute organ dysfunction: Secondary | ICD-10-CM | POA: Diagnosis not present

## 2021-07-18 DIAGNOSIS — D61818 Other pancytopenia: Secondary | ICD-10-CM | POA: Diagnosis not present

## 2021-07-18 DIAGNOSIS — K7460 Unspecified cirrhosis of liver: Secondary | ICD-10-CM | POA: Diagnosis not present

## 2021-07-18 DIAGNOSIS — E119 Type 2 diabetes mellitus without complications: Secondary | ICD-10-CM | POA: Diagnosis not present

## 2021-07-18 DIAGNOSIS — K7581 Nonalcoholic steatohepatitis (NASH): Secondary | ICD-10-CM | POA: Diagnosis not present

## 2021-07-18 DIAGNOSIS — E8770 Fluid overload, unspecified: Secondary | ICD-10-CM | POA: Diagnosis not present

## 2021-07-19 DIAGNOSIS — R6510 Systemic inflammatory response syndrome (SIRS) of non-infectious origin without acute organ dysfunction: Secondary | ICD-10-CM | POA: Diagnosis not present

## 2021-07-19 DIAGNOSIS — E119 Type 2 diabetes mellitus without complications: Secondary | ICD-10-CM | POA: Diagnosis not present

## 2021-07-19 DIAGNOSIS — K7581 Nonalcoholic steatohepatitis (NASH): Secondary | ICD-10-CM | POA: Diagnosis not present

## 2021-07-19 DIAGNOSIS — Z7409 Other reduced mobility: Secondary | ICD-10-CM | POA: Diagnosis not present

## 2021-07-19 DIAGNOSIS — N390 Urinary tract infection, site not specified: Secondary | ICD-10-CM | POA: Diagnosis not present

## 2021-07-19 DIAGNOSIS — K7460 Unspecified cirrhosis of liver: Secondary | ICD-10-CM | POA: Diagnosis not present

## 2021-07-19 DIAGNOSIS — D61818 Other pancytopenia: Secondary | ICD-10-CM | POA: Diagnosis not present

## 2021-07-20 ENCOUNTER — Ambulatory Visit: Payer: No Typology Code available for payment source | Admitting: Medical

## 2021-07-20 ENCOUNTER — Telehealth: Payer: Self-pay | Admitting: Medical

## 2021-07-20 DIAGNOSIS — D61818 Other pancytopenia: Secondary | ICD-10-CM | POA: Diagnosis not present

## 2021-07-20 DIAGNOSIS — R6510 Systemic inflammatory response syndrome (SIRS) of non-infectious origin without acute organ dysfunction: Secondary | ICD-10-CM | POA: Diagnosis not present

## 2021-07-20 DIAGNOSIS — N390 Urinary tract infection, site not specified: Secondary | ICD-10-CM | POA: Diagnosis not present

## 2021-07-20 DIAGNOSIS — Z7409 Other reduced mobility: Secondary | ICD-10-CM | POA: Diagnosis not present

## 2021-07-20 DIAGNOSIS — E119 Type 2 diabetes mellitus without complications: Secondary | ICD-10-CM | POA: Diagnosis not present

## 2021-07-20 DIAGNOSIS — K7460 Unspecified cirrhosis of liver: Secondary | ICD-10-CM | POA: Diagnosis not present

## 2021-07-20 DIAGNOSIS — K7581 Nonalcoholic steatohepatitis (NASH): Secondary | ICD-10-CM | POA: Diagnosis not present

## 2021-07-21 DIAGNOSIS — R531 Weakness: Secondary | ICD-10-CM | POA: Diagnosis not present

## 2021-07-21 DIAGNOSIS — S92911D Unspecified fracture of right toe(s), subsequent encounter for fracture with routine healing: Secondary | ICD-10-CM | POA: Diagnosis not present

## 2021-07-21 DIAGNOSIS — E8770 Fluid overload, unspecified: Secondary | ICD-10-CM | POA: Diagnosis not present

## 2021-07-21 DIAGNOSIS — K7460 Unspecified cirrhosis of liver: Secondary | ICD-10-CM | POA: Diagnosis not present

## 2021-07-21 DIAGNOSIS — D61818 Other pancytopenia: Secondary | ICD-10-CM | POA: Diagnosis not present

## 2021-07-21 DIAGNOSIS — N390 Urinary tract infection, site not specified: Secondary | ICD-10-CM | POA: Diagnosis not present

## 2021-07-21 DIAGNOSIS — E876 Hypokalemia: Secondary | ICD-10-CM | POA: Diagnosis not present

## 2021-07-21 DIAGNOSIS — L89152 Pressure ulcer of sacral region, stage 2: Secondary | ICD-10-CM | POA: Diagnosis not present

## 2021-07-21 DIAGNOSIS — R601 Generalized edema: Secondary | ICD-10-CM | POA: Diagnosis not present

## 2021-07-21 DIAGNOSIS — R6510 Systemic inflammatory response syndrome (SIRS) of non-infectious origin without acute organ dysfunction: Secondary | ICD-10-CM | POA: Diagnosis not present

## 2021-07-21 DIAGNOSIS — I959 Hypotension, unspecified: Secondary | ICD-10-CM | POA: Diagnosis not present

## 2021-07-24 DIAGNOSIS — K7460 Unspecified cirrhosis of liver: Secondary | ICD-10-CM | POA: Diagnosis not present

## 2021-07-24 DIAGNOSIS — R188 Other ascites: Secondary | ICD-10-CM | POA: Diagnosis not present

## 2021-07-24 DIAGNOSIS — I959 Hypotension, unspecified: Secondary | ICD-10-CM | POA: Diagnosis not present

## 2021-07-28 ENCOUNTER — Telehealth: Payer: Self-pay | Admitting: Medical

## 2021-07-28 DIAGNOSIS — I959 Hypotension, unspecified: Secondary | ICD-10-CM | POA: Diagnosis not present

## 2021-07-28 DIAGNOSIS — K7460 Unspecified cirrhosis of liver: Secondary | ICD-10-CM | POA: Diagnosis not present

## 2021-07-28 DIAGNOSIS — R188 Other ascites: Secondary | ICD-10-CM | POA: Diagnosis not present

## 2021-07-28 NOTE — Telephone Encounter (Signed)
Caller/Agency: [facility]) Callback Number: [phone] Requesting OT/PT/Skilled Nursing/Social Work/Speech Therapy: Nursing Frequency: 1 w 5

## 2021-07-28 NOTE — Telephone Encounter (Signed)
VO given.

## 2021-08-02 ENCOUNTER — Ambulatory Visit (INDEPENDENT_AMBULATORY_CARE_PROVIDER_SITE_OTHER): Payer: No Typology Code available for payment source | Admitting: Medical

## 2021-08-02 VITALS — BP 115/70 | HR 81 | Temp 98.2°F | Resp 18 | Ht 68.0 in | Wt 176.6 lb

## 2021-08-02 DIAGNOSIS — D696 Thrombocytopenia, unspecified: Secondary | ICD-10-CM

## 2021-08-02 DIAGNOSIS — K7682 Hepatic encephalopathy: Secondary | ICD-10-CM

## 2021-08-02 DIAGNOSIS — E876 Hypokalemia: Secondary | ICD-10-CM | POA: Diagnosis not present

## 2021-08-02 DIAGNOSIS — K7469 Other cirrhosis of liver: Secondary | ICD-10-CM

## 2021-08-02 LAB — COMPREHENSIVE METABOLIC PANEL
ALT: 26 U/L (ref 0–53)
AST: 41 U/L — ABNORMAL HIGH (ref 0–37)
Albumin: 3.6 g/dL (ref 3.5–5.2)
Alkaline Phosphatase: 76 U/L (ref 39–117)
BUN: 21 mg/dL (ref 6–23)
CO2: 28 mEq/L (ref 19–32)
Calcium: 9.1 mg/dL (ref 8.4–10.5)
Chloride: 105 mEq/L (ref 96–112)
Creatinine, Ser: 1.23 mg/dL (ref 0.40–1.50)
GFR: 55.9 mL/min — ABNORMAL LOW (ref >60.00)
Glucose, Bld: 87 mg/dL (ref 70–99)
Potassium: 3.6 mEq/L (ref 3.5–5.1)
Sodium: 141 mEq/L (ref 135–145)
Total Bilirubin: 3 mg/dL — ABNORMAL HIGH (ref 0.2–1.2)
Total Protein: 6.3 g/dL (ref 6.0–8.3)

## 2021-08-02 LAB — CBC WITH DIFFERENTIAL/PLATELET
Basophils Absolute: 0.1 10*3/uL (ref 0.0–0.1)
Basophils Relative: 2.1 % (ref 0.0–3.0)
Eosinophils Absolute: 0.1 10*3/uL (ref 0.0–0.7)
Eosinophils Relative: 3.1 % (ref 0.0–5.0)
HCT: 34.2 % — ABNORMAL LOW (ref 39.0–52.0)
Hemoglobin: 11.7 g/dL — ABNORMAL LOW (ref 13.0–17.0)
Lymphocytes Relative: 29.5 % (ref 12.0–46.0)
Lymphs Abs: 0.9 10*3/uL (ref 0.7–4.0)
MCHC: 34.3 g/dL (ref 30.0–36.0)
MCV: 102.5 fl — ABNORMAL HIGH (ref 78.0–100.0)
Monocytes Absolute: 0.5 10*3/uL (ref 0.1–1.0)
Monocytes Relative: 15.1 % — ABNORMAL HIGH (ref 3.0–12.0)
Neutro Abs: 1.6 10*3/uL (ref 1.4–7.7)
Neutrophils Relative %: 50.2 % (ref 43.0–77.0)
Platelets: 75 10*3/uL — ABNORMAL LOW (ref 150.0–400.0)
RBC: 3.33 Mil/uL — ABNORMAL LOW (ref 4.22–5.81)
RDW: 16.8 % — ABNORMAL HIGH (ref 11.5–15.5)
WBC: 3.1 10*3/uL — ABNORMAL LOW (ref 4.0–10.5)

## 2021-08-02 LAB — AMMONIA: Ammonia: 73 umol/L — ABNORMAL HIGH (ref 11–35)

## 2021-08-02 LAB — MAGNESIUM: Magnesium: 2.1 mg/dL (ref 1.5–2.5)

## 2021-08-02 MED ORDER — MIDODRINE HCL 5 MG PO TABS: 5.0000 mg | ORAL_TABLET | Freq: Three times a day (TID) | ORAL | 0 refills | Status: DC

## 2021-08-02 NOTE — Progress Notes (Signed)
Subjective:    Patient ID: Steve Watson, male    DOB: 1940-11-07, 81 y.o.   MRN: [id number]  HPI    Pt was admitted and dc from hospital on around 07-17-2021.   Since that visit he followed up with internal medicine at [facility]. Below in " is   hpi, objecitve and A/P     "81 year old male with a history of [name] cirrhosis admitted to the hospital with increased ascites and anasarca. Patient was managed with albumin infusions and Lasix drip with good diuresis during hospital stay. Nephrology has been following the patient during hospital stay and patient will be discharged on a combination of torsemide and Aldactone. Patient also had some episodes of hypotension during hospital stay and needed midodrine.  Hospitalist at home follow-up was requested to ensure adequate fluid management, recheck of electrolytes and medication reconciliation.  Patient has been doing well since discharge home. He has been taking both the torsemide and Aldactone. He has not felt dizzy or lightheaded at home. He has a scale at home but has not weighed himself. He feels subjectively that the swelling is better although he still has pretty significant peripheral edema all the way up to the thigh.  ASSESSMENT/ PLAN:  Cirrhosis of the liver with ascites: Continue torsemide and Aldactone. If renal functions are stable, consider increasing Aldactone from 12.5 to 25 mg p.o. daily as patient still has significant peripheral edema. Could also consider increasing torsemide dose if blood pressure holds steady later on.  Hypotension: Patient is on midodrine and blood pressure is stable.  Labs reviewed. Creatinine is stable. Electrolytes are stable. Asked patient to increase Aldactone to 25 mg p.o. daily. Follow-up visit on 07/28/2021. Sign off if blood pressure is stable at that time.   1. Cirrhosis of liver with ascites, unspecified hepatic cirrhosis type ([place])   2. Hypotension, unspecified hypotension type  "   Today bp 115/70.     Pt has hx of encephalopathy but reports no episodes of confusion.  On review he had ambulatory dysfunction on day of admission. He could not pick himself up or ambulate. He states by second day of admisstion he felt a lot better.   Pt had low k and low mag at time of admission. Before admission he fx 5th toe.  Pt dc on torsemide and spirinolactione. Hctz dc'd.  On midodrine and dc summary sites [place] for 30 days.    Review of Systems  Constitutional:  Negative for chills, fatigue and fever.  Respiratory:  Negative for cough, chest tightness, shortness of breath and wheezing.   Cardiovascular:  Negative for chest pain and palpitations.  Gastrointestinal:  Negative for abdominal pain, constipation, diarrhea and nausea.  Genitourinary:  Negative for dysuria, flank pain and frequency.  Musculoskeletal:  Negative for back pain, myalgias and neck pain.  Skin:  Negative for rash.  Neurological:  Negative for dizziness, numbness and headaches.  Hematological:  Negative for adenopathy. Does not bruise/bleed easily.  Psychiatric/Behavioral:  Negative for behavioral problems, decreased concentration, dysphoric mood and sleep disturbance. The patient is not nervous/anxious.    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     Social History   Socioeconomic History   Marital status: Married    Spouse name: Not on file   Number of children: Not on file   Years of education: Not on file  Highest education level: Not on file  Occupational History   Not on file  Tobacco Use   Smoking status: Never   Smokeless tobacco: Never   Tobacco comments:    NEVER USED TOBACCO  Vaping Use   Vaping Use: Never used  Substance and Sexual Activity   Alcohol use: No   Drug use: No   Sexual activity: Yes  Other Topics Concern   Not on file  Social History  Narrative   2 Sons; grandson works in Recruitment consultant in [employer] for 8 years    Enjoys weightlifting    Social Determinants of Health   Financial Resource Strain: Low Risk  (02/24/2020)   Overall Financial Resource Strain (CARDIA)    Difficulty of Paying Living Expenses: Not hard at all  Food Insecurity: No Food Insecurity (02/24/2020)   Hunger Vital Sign    Worried About Running Out of Food in the Last Year: Never true    Ran Out of Food in the Last Year: Never true  Transportation Needs: No Transportation Needs (02/24/2020)   PRAPARE - Hydrologist (Medical): No    Lack of Transportation (Non-Medical): No  Physical Activity: Sufficiently Active (02/24/2020)   Exercise Vital Sign    Days of Exercise per Week: 3 days    Minutes of Exercise per Session: 50 min  Stress: No Stress Concern Present (02/24/2020)   [place]    Feeling of Stress : Not at all  Social Connections: Moderately Isolated (02/24/2020)   Social Connection and Isolation Panel [NHANES]    Frequency of Communication with Friends and Family: More than three times a week    Frequency of Social Gatherings with Friends and Family: More than three times a week    Attends Religious Services: Never    Marine scientist or Organizations: No    Attends Archivist Meetings: Never    Marital Status: Married  Human resources officer Violence: Not At Risk (02/24/2020)   Humiliation, Afraid, Rape, and Kick questionnaire    Fear of Current or Ex-Partner: No    Emotionally Abused: No    Physically Abused: No    Sexually Abused: No    Past Surgical History:  Procedure Laterality Date   BIOPSY  06/27/2021   Procedure: BIOPSY;  Surgeon: Lavena Bullion, DO;  Location: [facility];  Service: Gastroenterology;;   ESOPHAGOGASTRODUODENOSCOPY (EGD) WITH PROPOFOL N/A 06/27/2021   Procedure: ESOPHAGOGASTRODUODENOSCOPY (EGD) WITH  PROPOFOL;  Surgeon: Lavena Bullion, DO;  Location: [facility];  Service: Gastroenterology;  Laterality: N/A;   NO PAST SURGERIES      Family History  Problem Relation Age of Onset   Hypertension Mother    Hypertension Father    Hypertension Brother    Diabetes Brother    Healthy Child    Healthy Grandchild     No Known Allergies  Current Outpatient Medications on File Prior to Visit  Medication Sig Dispense Refill   aspirin 81 MG tablet Take 81 mg by mouth daily.     Cholecalciferol 2000 UNITS TABS Take 1 tablet (2,000 Units total) by mouth daily at 12 noon. 30 each 2   lactulose (CHRONULAC) 10 GM/15ML solution Take 30 mLs (20 g total) by mouth 2 (two) times daily. 236 mL 0   Multiple Vitamins-Minerals (VISION VITAMINS) TABS Take 2 tablets by mouth daily.     omeprazole (PRILOSEC) 10 MG capsule Take 10 mg  by mouth daily as needed (heatrt burn).     pantoprazole (PROTONIX) 40 MG tablet Take 1 tablet (40 mg total) by mouth 2 (two) times daily. 60 tablet 1   rifaximin (XIFAXAN) 550 MG TABS tablet Take 1 tablet (550 mg total) by mouth 2 (two) times daily. 42 tablet 0   Zinc 100 MG TABS Take 100 mg by mouth daily.     No current facility-administered medications on file prior to visit.    BP 115/70   Pulse 81   Temp 98.2 F (36.8 C)   Resp 18   Ht 5' 8"  (1.727 m)   Wt 176 lb 9.6 oz (80.1 kg)   SpO2 96%   BMI 26.85 kg/m         Objective:   Physical Exam  General Mental Status- Alert. General Appearance- Not in acute distress.    Skin General: Color- Normal Color. Moisture- Normal Moisture.   Neck Carotid Arteries- Normal color. Moisture- Normal Moisture. No carotid bruits. No JVD.   Chest and Lung Exam Auscultation: Breath Sounds:-Normal.   Cardiovascular Auscultation:Rythm- Regular. Murmurs & Other Heart Sounds:Auscultation of the heart reveals- No Murmurs.   Abdomen Inspection:-Inspeection Normal. Palpation/Percussion:Note:No mass. Palpation and  Percussion of the abdomen reveal- Non Tender, Non Distended + BS, no rebound or guarding.     Neurologic Cranial Nerve exam:- CN III-XII intact(No nystagmus), symmetric smile. Strength:- 5/5 equal and symmetric strength both upper and lower extremities.    Lower ext- severe 2 + pedal edema. Symmetric with no redness, no wamth or tenderness. Negative homans signs.      Assessment & Plan:   Patient Instructions  Presently appears clinically stable posthospitalizations.  this summer hepatic encephalopathy, low platelets, anemia, cirrhosis, duodenal ulcer, low potassium, pedal edema and esophageal varices we will get a CBC, CMP, magnesium and  ammonia level. Recent placed on spirinolactone and toresemide. Continue these but stop the hctz.  Will get opinion on combination diuretic use from GI when you are seen on Wednesday.   Recent low bp levels. Continue midodrine 5 mg tid.  Continue lactulose.   Check cbc today to follow hb, hct and plateletes.  Follow up in 3 weeks or sooner if needed.

## 2021-08-02 NOTE — Patient Instructions (Addendum)
Patient Instructions  Presently appears clinically stable posthospitalizations.  this summer hepatic encephalopathy, low platelets, anemia, cirrhosis, duodenal ulcer, low potassium, pedal edema and esophageal varices.  We will get  CBC, CMP, magnesium and  ammonia level. Recent placed on spirinolactone and toresemide. Continue these but stop the hctz.  Will get opinion on combination diuretic use from GI when you are seen on Wednesday.   Recent low bp levels. Continue midodrine 5 mg tid.  Continue lactulose.   Check cbc today to follow hb, hct and plateletes.  Follow up in 3 weeks or sooner if needed.

## 2021-08-04 ENCOUNTER — Encounter: Payer: Self-pay | Admitting: Physician Assistant

## 2021-08-04 ENCOUNTER — Ambulatory Visit (INDEPENDENT_AMBULATORY_CARE_PROVIDER_SITE_OTHER): Payer: No Typology Code available for payment source | Admitting: Physician Assistant

## 2021-08-04 VITALS — BP 126/64 | HR 87 | Ht 68.0 in | Wt 175.0 lb

## 2021-08-04 DIAGNOSIS — K209 Esophagitis, unspecified without bleeding: Secondary | ICD-10-CM | POA: Diagnosis not present

## 2021-08-04 DIAGNOSIS — K7460 Unspecified cirrhosis of liver: Secondary | ICD-10-CM

## 2021-08-04 DIAGNOSIS — K7290 Hepatic failure, unspecified without coma: Secondary | ICD-10-CM

## 2021-08-04 DIAGNOSIS — K7581 Nonalcoholic steatohepatitis (NASH): Secondary | ICD-10-CM

## 2021-08-04 DIAGNOSIS — I8510 Secondary esophageal varices without bleeding: Secondary | ICD-10-CM | POA: Diagnosis not present

## 2021-08-04 NOTE — Progress Notes (Signed)
Agree with the assessment and plan as outlined by Jennifer Lemmon, PA-C. ? ?Kadien Lineman, DO, FACG ? ?

## 2021-08-04 NOTE — Patient Instructions (Signed)
If you are age 79 or older, your body mass index should be between 23-30. Your Body mass index is 26.61 kg/m. If this is out of the aforementioned range listed, please consider follow up with your Primary Care Provider. ________________________________________________________  The [facility] providers would like to encourage you to use [facility] to communicate with providers for non-urgent requests or questions.  Due to long hold times on the telephone, sending your provider a message by [facility] may be a faster and more efficient way to get a response.  Please allow 48 business hours for a response.  Please remember that this is for non-urgent requests.  _______________________________________________________  Keep your appointment for your Endoscopy which is scheduled for September 06, 2021  Continue your current medications.  Follow up as needed for now.  Thank you for entrusting me with your care and choosing [facility].  Ellouise Newer, PA-C

## 2021-08-04 NOTE — Progress Notes (Addendum)
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    06/26/2021 patient consulted by our service for hepatic encephalopathy.  Right upper quadrant ultrasound showed mildly nodular liver contour with coarse echotexture.  Patient was recommended an EGD given that he likely had cirrhosis.    06/27/2021 patient underwent EGD with grade 1 esophageal varices, gastritis and nonbleeding duodenal ulcers.  At that time recommended starting Pantoprazole 40 p.o. twice daily x8 weeks and then reduce to 40 mg daily, repeat EGD in 8 weeks to check for healing and start Rifaximin 550 mg twice daily and resume Lactulose.    06/29/2021 our service signed off.  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  [place] screening showed a normal AFP.  Also noted to have chronic macrocytic anemia.  This is thought due to chronic disease.  That time Lactulose have been reduced to 20 g twice daily due to excessive bowel movements and worsening metabolic acidosis.    07/15/2021 patient seen at the ED at [facility] for confusion.  At that time ammonia 208.  At that time told to take 30 mL of lactulose 3 times daily.    07/17/2021-07/22/2021 patient admitted to [facility] for "ambulatory dysfunction".  Describe generalized weakness and bilateral lower extremity edema.  He had been started on albumin and a Lasix drip and nephrology followed for managing fluid overload.  His diuretics were adjusted.  At discharge kept on Midodrine 5 mg 3 times daily for 30 days.  Aldactone 12.5 mg daily for 30 days and Torsemide  20 mg daily for 30 days.    07/24/2021 patient followed with internal medicine for cirrhosis.  At that time his labs were stable and his Aldactone was increased to 25 mg daily.    07/28/2021 followed with them via telemedicine and had continued on Midodrine and Aldactone 25 mg daily.    08/02/2021 CBC with hemoglobin stable 11.7, white count in patient's normal range at 3.1.  Platelets improving at 75,000.  CMP with mild increase in total bilirubin 2.2-3.0.  AST 41.  Ammonia level improved to 73.    Today, patient presents to clinic accompanied by his wife.  He tells me that he is doing well.  He is currently taking Aldactone 25 mg once daily, torsemide 20 mg once daily, lactulose 30 mg 3 times daily and Pantoprazole 40 mg twice a day.  His wife tells me she thinks he is doing well at.  He is having 2-3 bowel movements a day.  No altered mental status.  She does tell me they recently ran out of Midodrine but they are going to get a refill of this and the coming days.  Discussed that this is important for him to stay on.  She also discussed that Rifaximin was over $2000 for them so they have not started that medication and will not be unless we can figure out a different way for him to get it.    Denies fever, chills, weight loss, abdominal pain or altered mental status.  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     Cholecalciferol 2000 UNITS TABS Take 1 tablet (2,000 Units total) by mouth daily at 12 noon. 30 each 2   lactulose (CHRONULAC) 10 GM/15ML solution Take 30 mLs (20 g total) by mouth 2 (two) times daily. 236 mL 0   midodrine (PROAMATINE) 5 MG tablet Take 1 tablet (5 mg total) by mouth 3 (three) times daily with meals. 90 tablet 0   Multiple Vitamins-Minerals (VISION VITAMINS) TABS Take 2 tablets by mouth daily.     omeprazole (PRILOSEC) 10 MG capsule Take 10 mg by mouth daily as needed (heatrt burn).     pantoprazole (PROTONIX) 40 MG tablet Take 1 tablet (40 mg total) by mouth 2 (two) times daily. 60 tablet 1   rifaximin (XIFAXAN) 550 MG TABS tablet Take 1 tablet (550 mg total) by mouth 2 (two) times daily. 42 tablet 0   Zinc 100 MG TABS Take 100 mg by mouth daily.     No current facility-administered medications for this visit.    Allergies as of 08/04/2021   (No Known Allergies)    Family History  Problem Relation Age of Onset   Hypertension Mother    Hypertension Father    Hypertension Brother    Diabetes Brother    Healthy Child    Healthy Grandchild     Social History   Socioeconomic History   Marital status: Married    Spouse name: Not on file   Number of children: Not on file   Years of education: Not on file   Highest education level: Not on file  Occupational History   Not on file  Tobacco Use   Smoking status: Never   Smokeless tobacco: Never   Tobacco comments:    NEVER USED TOBACCO  Vaping Use   Vaping Use: Never used  Substance and Sexual Activity   Alcohol use: No   Drug use: No   Sexual activity: Yes  Other Topics Concern   Not on file  Social History Narrative   2 Sons; grandson works in Recruitment consultant in [employer] for 8 years    Enjoys weightlifting    Social Determinants of Health   Financial Resource Strain: Low Risk  (02/24/2020)   Overall Financial Resource  Strain (CARDIA)    Difficulty of Paying Living Expenses: Not hard at all  Food Insecurity: No Food Insecurity (02/24/2020)   Hunger Vital Sign    Worried About Running Out of Food in the Last Year: Never true    [place] in the Last Year: Never true  Transportation Needs: No Transportation Needs (02/24/2020)   PRAPARE - Hydrologist (Medical): No    Lack of Transportation (Non-Medical): No  Physical Activity: Sufficiently Active (02/24/2020)   Exercise Vital Sign    Days of Exercise per Week: 3 days    Minutes of Exercise per Session: 50  min  Stress: No Stress Concern Present (02/24/2020)   [place]    Feeling of Stress : Not at all  Social Connections: Moderately Isolated (02/24/2020)   Social Connection and Isolation Panel [NHANES]    Frequency of Communication with Friends and Family: More than three times a week    Frequency of Social Gatherings with Friends and Family: More than three times a week    Attends Religious Services: Never    Marine scientist or Organizations: No    Attends Archivist Meetings: Never    Marital Status: Married  Human resources officer Violence: Not At Risk (02/24/2020)   Humiliation, Afraid, Rape, and Kick questionnaire    Fear of Current or Ex-Partner: No    Emotionally Abused: No    Physically Abused: No    Sexually Abused: No    Review of Systems:    Constitutional: No weight loss, fever or chills Cardiovascular: No chest pain Respiratory: No SOB  Gastrointestinal: See HPI and otherwise negative   Physical Exam:  Vital signs: BP 126/64   Pulse 87   Ht 5' 8"  (1.727 m)   Wt 175 lb (79.4 kg)   BMI 26.61 kg/m    Constitutional:   Pleasant ill appearing Caucasian male appears to be in NAD, Well developed, Well nourished, alert and cooperative Respiratory: Respirations even and unlabored. Lungs clear to auscultation bilaterally.   No  wheezes, crackles, or rhonchi.  Cardiovascular: Normal S1, S2. No MRG. Regular rate and rhythm. No peripheral edema, cyanosis or pallor.  Gastrointestinal:  Soft, nondistended, nontender. No rebound or guarding. Normal bowel sounds. No appreciable masses or hepatomegaly. Rectal:  Not performed.  Psychiatric: Oriented to person, place and time. Demonstrates good judgement and reason without abnormal affect or behaviors.  RELEVANT LABS AND IMAGING: CBC    Component Value Date/Time   WBC 3.1 (L) 08/02/2021 1258   RBC 3.33 (L) 08/02/2021 1258   HGB 11.7 (L) 08/02/2021 1258   HGB 12.0 (L) 06/25/2021 0741   HGB 13.8 07/13/2020 1322   HGB 12.6 (L) 11/10/2016 0825   HCT 34.2 (L) 08/02/2021 1258   HCT 38.2 07/13/2020 1322   HCT 36.5 (L) 11/10/2016 0825   PLT 75.0 (L) 08/02/2021 1258   PLT 81 (L) 06/25/2021 0741   PLT 88 (LL) 07/13/2020 1322   MCV 102.5 (H) 08/02/2021 1258   MCV 93 07/13/2020 1322   MCV 93 11/10/2016 0825   MCH 34.7 (H) 06/28/2021 0804   MCHC 34.3 08/02/2021 1258   RDW 16.8 (H) 08/02/2021 1258   RDW 13.1 07/13/2020 1322   RDW 14.1 11/10/2016 0825   LYMPHSABS 0.9 08/02/2021 1258   LYMPHSABS 0.7 07/13/2020 1322   LYMPHSABS 1.2 11/10/2016 0825   MONOABS 0.5 08/02/2021 1258   EOSABS 0.1 08/02/2021 1258   EOSABS 0.0 07/13/2020 1322   EOSABS 0.1 11/10/2016 0825   BASOSABS 0.1 08/02/2021 1258   BASOSABS 0.0 07/13/2020 1322   BASOSABS 0.0 11/10/2016 0825    CMP     Component Value Date/Time   NA 141 08/02/2021 1258   NA 138 07/13/2020 1322   K 3.6 08/02/2021 1258   CL 105 08/02/2021 1258   CO2 28 08/02/2021 1258   GLUCOSE 87 08/02/2021 1258   BUN 21 08/02/2021 1258   BUN 18 07/13/2020 1322   CREATININE 1.23 08/02/2021 1258   CREATININE 0.80 06/25/2021 0741   CALCIUM 9.1 08/02/2021 1258   PROT 6.3 08/02/2021 1258  PROT 5.3 (L) 07/13/2020 1322   ALBUMIN 3.6 08/02/2021 1258   ALBUMIN 2.7 (L) 07/13/2020 1322   AST 41 (H) 08/02/2021 1258   AST 36 06/25/2021  0741   ALT 26 08/02/2021 1258   ALT 23 06/25/2021 0741   ALKPHOS 76 08/02/2021 1258   BILITOT 3.0 (H) 08/02/2021 1258   BILITOT 2.6 (H) 06/25/2021 0741   GFRNONAA >60 06/29/2021 0539   GFRNONAA >60 06/25/2021 0741   GFRAA >60 07/05/2019 0743    Assessment: 1.  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  Esophagitis/GERD: Found during recent EGD with repeat EGD recommended a month from now  Plan: 1.  Continue Midodrine 3 times daily 2.  Continue Torsemide 20 mg daily and Aldactone 25 mg daily 3.  Continue Lactulose 30 mg 3 times daily.  Discussed titration of this to 3-4 soft bowel movements a day.  Explained especially if they cannot afford the Rifaximin then this needs to be monitored closely. 4.  Continue Pantoprazole 40 mg twice daily for now.  At time of EGD explained this may be decreased to once daily. 5.  Patient is already scheduled for EGD with Dr. Bryan Lemma on 09/06/2021.  Reviewed risks, benefits, limitations and alternatives and patient agrees to proceed. 6.  Steve Watson screening up-to-date given patient has had abdominal imaging and also an AFP during recent hospitalization.  Last ultrasound 06/25/2021.  Repeat recommended in 6 months. 7.  Patient to follow in clinic with [place] per recommendations from Dr. Bryan Lemma after time of procedure.  Ellouise Newer, PA-C [facility] 08/04/2021, 8:30 AM  Cc: Mackie Pai, PA-C

## 2021-08-10 ENCOUNTER — Telehealth: Payer: Self-pay | Admitting: Medical

## 2021-08-10 NOTE — Telephone Encounter (Signed)
[facility] OT) called stating he had completed his OT eval on the pt and has concluded that OT for the pt is no longer needed at this time.

## 2021-08-17 DIAGNOSIS — M79674 Pain in right toe(s): Secondary | ICD-10-CM | POA: Diagnosis not present

## 2021-08-17 DIAGNOSIS — S92514A Nondisplaced fracture of proximal phalanx of right lesser toe(s), initial encounter for closed fracture: Secondary | ICD-10-CM | POA: Diagnosis not present

## 2021-08-23 ENCOUNTER — Ambulatory Visit (HOSPITAL_BASED_OUTPATIENT_CLINIC_OR_DEPARTMENT_OTHER)
Admission: RE | Admit: 2021-08-23 | Discharge: 2021-08-23 | Disposition: A | Discharge status: Home / Self Care | Payer: No Typology Code available for payment source | Source: Ambulatory Visit | Attending: Medical | Admitting: Medical

## 2021-08-23 ENCOUNTER — Ambulatory Visit (INDEPENDENT_AMBULATORY_CARE_PROVIDER_SITE_OTHER): Payer: No Typology Code available for payment source | Admitting: Medical

## 2021-08-23 VITALS — BP 120/62 | HR 91 | Resp 18 | Ht 68.0 in | Wt 181.0 lb

## 2021-08-23 DIAGNOSIS — R600 Localized edema: Secondary | ICD-10-CM | POA: Insufficient documentation

## 2021-08-23 DIAGNOSIS — K7469 Other cirrhosis of liver: Secondary | ICD-10-CM

## 2021-08-23 DIAGNOSIS — R7989 Other specified abnormal findings of blood chemistry: Secondary | ICD-10-CM | POA: Diagnosis not present

## 2021-08-23 DIAGNOSIS — D649 Anemia, unspecified: Secondary | ICD-10-CM

## 2021-08-23 DIAGNOSIS — D696 Thrombocytopenia, unspecified: Secondary | ICD-10-CM

## 2021-08-23 DIAGNOSIS — K269 Duodenal ulcer, unspecified as acute or chronic, without hemorrhage or perforation: Secondary | ICD-10-CM

## 2021-08-23 DIAGNOSIS — K7682 Hepatic encephalopathy: Secondary | ICD-10-CM

## 2021-08-23 LAB — CBC WITH DIFFERENTIAL/PLATELET
Basophils Absolute: 0.1 10*3/uL (ref 0.0–0.1)
Basophils Relative: 2.1 % (ref 0.0–3.0)
Eosinophils Absolute: 0.1 10*3/uL (ref 0.0–0.7)
Eosinophils Relative: 4 % (ref 0.0–5.0)
HCT: 30.4 % — ABNORMAL LOW (ref 39.0–52.0)
Hemoglobin: 10.4 g/dL — ABNORMAL LOW (ref 13.0–17.0)
Lymphocytes Relative: 27.7 % (ref 12.0–46.0)
Lymphs Abs: 0.8 10*3/uL (ref 0.7–4.0)
MCHC: 34.2 g/dL (ref 30.0–36.0)
MCV: 102.3 fl — ABNORMAL HIGH (ref 78.0–100.0)
Monocytes Absolute: 0.4 10*3/uL (ref 0.1–1.0)
Monocytes Relative: 13.4 % — ABNORMAL HIGH (ref 3.0–12.0)
Neutro Abs: 1.4 10*3/uL (ref 1.4–7.7)
Neutrophils Relative %: 52.8 % (ref 43.0–77.0)
Platelets: 63 10*3/uL — ABNORMAL LOW (ref 150.0–400.0)
RBC: 2.97 Mil/uL — ABNORMAL LOW (ref 4.22–5.81)
RDW: 15.5 % (ref 11.5–15.5)
WBC: 2.7 10*3/uL — ABNORMAL LOW (ref 4.0–10.5)

## 2021-08-23 LAB — COMPREHENSIVE METABOLIC PANEL
ALT: 22 U/L (ref 0–53)
AST: 36 U/L (ref 0–37)
Albumin: 3 g/dL — ABNORMAL LOW (ref 3.5–5.2)
Alkaline Phosphatase: 70 U/L (ref 39–117)
BUN: 8 mg/dL (ref 6–23)
CO2: 24 mEq/L (ref 19–32)
Calcium: 8.7 mg/dL (ref 8.4–10.5)
Chloride: 112 mEq/L (ref 96–112)
Creatinine, Ser: 0.77 mg/dL (ref 0.40–1.50)
GFR: 85.21 mL/min (ref >60.00)
Glucose, Bld: 93 mg/dL (ref 70–99)
Potassium: 4 mEq/L (ref 3.5–5.1)
Sodium: 141 mEq/L (ref 135–145)
Total Bilirubin: 2.2 mg/dL — ABNORMAL HIGH (ref 0.2–1.2)
Total Protein: 5.9 g/dL — ABNORMAL LOW (ref 6.0–8.3)

## 2021-08-23 LAB — BRAIN NATRIURETIC PEPTIDE: Pro B Natriuretic peptide (BNP): 69 pg/mL (ref 0.0–100.0)

## 2021-08-23 LAB — AMMONIA: Ammonia: 67 umol/L — ABNORMAL HIGH (ref 11–35)

## 2021-08-23 MED ORDER — TORSEMIDE 20 MG PO TABS: 20.0000 mg | ORAL_TABLET | Freq: Every day | ORAL | 0 refills | Status: DC

## 2021-08-23 NOTE — Addendum Note (Signed)
Addended by: Anabel Halon on: 08/23/2021 07:59 PM   Modules accepted: Orders

## 2021-08-23 NOTE — Progress Notes (Signed)
Subjective:    Patient ID: Steve Watson, male    DOB: Oct 24, 1940, 81 y.o.   MRN: [id number]  HPI  Pt in for follow up.   Pt saw GI MD office 2 days after I last saw pt.  A/P from gi MD office.  "Assessment: 1.  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  Esophagitis/GERD: Found during recent EGD with repeat EGD recommended a month from now   Plan: 1.  Continue Midodrine 3 times daily 2.  Continue Torsemide 20 mg daily and Aldactone 25 mg daily 3.  Continue Lactulose 30 mg 3 times daily.  Discussed titration of this to 3-4 soft bowel movements a day.  Explained especially if they cannot afford the Rifaximin then this needs to be monitored closely. 4.  Continue Pantoprazole 40 mg twice daily for now.  At time of EGD explained this may be decreased to once daily. 5.  Patient is already scheduled for EGD with Dr. Bryan Lemma on 09/06/2021.  Reviewed risks, benefits, limitations and alternatives and patient agrees to proceed. 6.  [place] screening up-to-date given patient has had abdominal imaging and also an AFP during recent hospitalization.  Last ultrasound 06/25/2021.  Repeat recommended in 6 months. 7.  Patient to follow in clinic with [place] per recommendations from Dr. Bryan Lemma after time of procedure."  Pt doing well overall. Not having any episodes of confusion.      Review of Systems  Constitutional:  Negative for chills, fatigue and fever.  HENT:  Negative for congestion, dental problem and drooling.   Respiratory:  Negative for cough, chest tightness, shortness of breath and wheezing.   Cardiovascular:  Negative for chest pain and palpitations.  Gastrointestinal:  Negative for abdominal pain, constipation and nausea.  Musculoskeletal:  Negative for back pain, joint swelling and neck pain.  Skin:  Negative for rash.  Neurological:        No confusion recently.  Hematological:  Negative for adenopathy. Does not  bruise/bleed easily.  Psychiatric/Behavioral:  Negative for behavioral problems and decreased concentration.     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     Social History   Socioeconomic History   Marital status: Married    Spouse name: Not on file   Number of children: Not on file   Years of education: Not on file   Highest education level: Not on file  Occupational History   Not on file  Tobacco Use   Smoking status: Never   Smokeless tobacco: Never   Tobacco comments:    NEVER USED TOBACCO  Vaping Use   Vaping Use: Never used  Substance and Sexual Activity   Alcohol use: No   Drug use: No   Sexual activity: Yes  Other Topics Concern   Not on file  Social History Narrative   2 Sons; grandson works in Recruitment consultant in [employer] for 8 years    Enjoys weightlifting    Social Determinants of Health   Financial Resource Strain: Low Risk  (02/24/2020)   Overall Financial Resource Strain (CARDIA)    Difficulty of Paying Living Expenses: Not hard at all  Food Insecurity: No Food Insecurity (02/24/2020)   Hunger Vital Sign    Worried About Running Out of Food in the Last Year: Never true    [place] in the Last  Year: Never true  Transportation Needs: No Transportation Needs (02/24/2020)   PRAPARE - Hydrologist (Medical): No    Lack of Transportation (Non-Medical): No  Physical Activity: Sufficiently Active (02/24/2020)   Exercise Vital Sign    Days of Exercise per Week: 3 days    Minutes of Exercise per Session: 50 min  Stress: No Stress Concern Present (02/24/2020)   [place]    Feeling of Stress : Not at all  Social Connections: Moderately Isolated (02/24/2020)   Social Connection and Isolation Panel [NHANES]    Frequency of Communication  with Friends and Family: More than three times a week    Frequency of Social Gatherings with Friends and Family: More than three times a week    Attends Religious Services: Never    Marine scientist or Organizations: No    Attends Archivist Meetings: Never    Marital Status: Married  Human resources officer Violence: Not At Risk (02/24/2020)   Humiliation, Afraid, Rape, and Kick questionnaire    Fear of Current or Ex-Partner: No    Emotionally Abused: No    Physically Abused: No    Sexually Abused: No    Past Surgical History:  Procedure Laterality Date   BIOPSY  06/27/2021   Procedure: BIOPSY;  Surgeon: Lavena Bullion, DO;  Location: [facility];  Service: Gastroenterology;;   ESOPHAGOGASTRODUODENOSCOPY (EGD) WITH PROPOFOL N/A 06/27/2021   Procedure: ESOPHAGOGASTRODUODENOSCOPY (EGD) WITH PROPOFOL;  Surgeon: Lavena Bullion, DO;  Location: [facility];  Service: Gastroenterology;  Laterality: N/A;   NO PAST SURGERIES      Family History  Problem Relation Age of Onset   Hypertension Mother    Hypertension Father    Hypertension Brother    Diabetes Brother    Healthy Child    Healthy Grandchild     No Known Allergies  Current Outpatient Medications on File Prior to Visit  Medication Sig Dispense Refill   aspirin 81 MG tablet Take 81 mg by mouth daily.     Cholecalciferol 2000 UNITS TABS Take 1 tablet (2,000 Units total) by mouth daily at 12 noon. 30 each 2   lactulose (CHRONULAC) 10 GM/15ML solution Take 30 mLs (20 g total) by mouth 2 (two) times daily. 236 mL 0   midodrine (PROAMATINE) 5 MG tablet Take 1 tablet (5 mg total) by mouth 3 (three) times daily with meals. 90 tablet 0   Multiple Vitamins-Minerals (VISION VITAMINS) TABS Take 2 tablets by mouth daily.     omeprazole (PRILOSEC) 10 MG capsule Take 10 mg by mouth daily as needed (heatrt burn).     pantoprazole (PROTONIX) 40 MG tablet Take 1 tablet (40 mg total) by mouth 2 (two) times daily. 60 tablet 1    rifaximin (XIFAXAN) 550 MG TABS tablet Take 1 tablet (550 mg total) by mouth 2 (two) times daily. 42 tablet 0   Zinc 100 MG TABS Take 100 mg by mouth daily.     No current facility-administered medications on file prior to visit.    BP 120/62   Pulse 91   Resp 18   Ht 5' 8"  (1.727 m)   Wt 181 lb (82.1 kg)   SpO2 97%   BMI 27.52 kg/m        Objective:   Physical Exam  General Mental Status- Alert. General Appearance- Not in acute distress.   Skin General: Color- Normal Color. Moisture- Normal Moisture.  Neck Carotid Arteries- Normal color. Moisture- Normal Moisture. No carotid bruits. No JVD.  Chest and Lung Exam Auscultation: Breath Sounds:-Normal.  Cardiovascular Auscultation:Rythm- Regular. Murmurs & Other Heart Sounds:Auscultation of the heart reveals- No Murmurs.  Abdomen Inspection:-Inspeection Normal. Palpation/Percussion:Note:No mass. Palpation and Percussion of the abdomen reveal- Non Tender, Non Distended + BS, no rebound or guarding.   Neurologic Cranial Nerve exam:- CN III-XII intact(No nystagmus), symmetric smile. Strength:- 5/5 equal and symmetric strength both upper and lower extremities.    Lower ext- bilateral moderate to severe pedal edema(symmetric). Negative homans signs.       Assessment & Plan:   Patient Instructions  1 Presently appears clinically stable presently. This summer hepatic encephalopathy, low platelets, anemia, cirrhosis, duodenal ulcer, low potassium, pedal edema and esophageal varices.  We will get  CBC, CMP and  ammonia level.   2. Continue Midodrine 3 times daily 3.  Continue Torsemide 20 mg daily and Aldactone 25 mg daily 4.  Continue Lactulose 30 mg 3 times daily.  Discussed titration of this to 3-4 soft bowel movements a day.  Explained especially if they cannot afford the Rifaximin then this needs to be monitored closely. 5.   Gerd and hx of ulcer. Continue Pantoprazole 40 mg twice daily for now.  At time of EGD  GI  explained this may be decreased to once daily.  6. EGD on August 14 th with Dr. Cathleen Corti.  Follow up one month or sooner if needed.    Mackie Pai, PA-C     Mackie Pai, PA-C

## 2021-08-23 NOTE — Patient Instructions (Addendum)
1 Presently appears clinically stable presently. This summer hepatic encephalopathy, low platelets, anemia, cirrhosis, duodenal ulcer, low potassium, pedal edema and esophageal varices.  We will get  CBC, CMP and  ammonia level.   2. Continue Midodrine 3 times daily 3.  Continue Torsemide 20 mg daily and Aldactone 25 mg daily 4.  Continue Lactulose 30 mg 3 times daily.  Discussed titration of this to 3-4 soft bowel movements a day.  Explained especially if they cannot afford the Rifaximin then this needs to be monitored closely. 5.   Gerd and hx of ulcer. Continue Pantoprazole 40 mg twice daily for now.  At time of EGD  GI explained this may be decreased to once daily.  6. EGD on August 14 th with Dr. Cathleen Corti.  7. For pedal edema will get bnp and cxr today.  Follow up one month or sooner if needed.

## 2021-09-06 ENCOUNTER — Ambulatory Visit (AMBULATORY_SURGERY_CENTER): Payer: No Typology Code available for payment source | Admitting: Gastroenterology

## 2021-09-06 ENCOUNTER — Encounter: Payer: Self-pay | Admitting: Gastroenterology

## 2021-09-06 VITALS — BP 105/63 | HR 73 | Temp 98.7°F | Resp 16 | Ht 68.0 in | Wt 175.0 lb

## 2021-09-06 DIAGNOSIS — K2960 Other gastritis without bleeding: Secondary | ICD-10-CM

## 2021-09-06 DIAGNOSIS — K31819 Angiodysplasia of stomach and duodenum without bleeding: Secondary | ICD-10-CM | POA: Diagnosis not present

## 2021-09-06 DIAGNOSIS — K269 Duodenal ulcer, unspecified as acute or chronic, without hemorrhage or perforation: Secondary | ICD-10-CM | POA: Diagnosis not present

## 2021-09-06 DIAGNOSIS — I8510 Secondary esophageal varices without bleeding: Secondary | ICD-10-CM | POA: Diagnosis not present

## 2021-09-06 DIAGNOSIS — K766 Portal hypertension: Secondary | ICD-10-CM

## 2021-09-06 DIAGNOSIS — K7581 Nonalcoholic steatohepatitis (NASH): Secondary | ICD-10-CM | POA: Diagnosis not present

## 2021-09-06 DIAGNOSIS — K2980 Duodenitis without bleeding: Secondary | ICD-10-CM | POA: Diagnosis not present

## 2021-09-06 MED ORDER — SUCRALFATE 1 GM/10ML PO SUSP: 1.0000 g | Freq: Four times a day (QID) | ORAL | 2 refills | Status: DC

## 2021-09-06 MED ORDER — ESOMEPRAZOLE MAGNESIUM 40 MG PO CPDR: 40.0000 mg | DELAYED_RELEASE_CAPSULE | Freq: Two times a day (BID) | ORAL | 1 refills | Status: DC

## 2021-09-06 MED ORDER — SODIUM CHLORIDE 0.9 % IV SOLN: 500.0000 mL | Freq: Once | INTRAVENOUS | Status: DC

## 2021-09-06 NOTE — Patient Instructions (Addendum)
Please read handouts provided. Continue present medications. Await pathology results. Begin Nexium ( esomeprazole ) 40 mg twice daily for 8 weeks, stop Protonix. Begin Sucralfate suspension 1 gram 4 times daily for 4 weeks. Follow-up in GI clinic.   YOU HAD AN ENDOSCOPIC PROCEDURE TODAY AT [facility]:   Refer to the procedure report that was given to you for any specific questions about what was found during the examination.  If the procedure report does not answer your questions, please call your gastroenterologist to clarify.  If you requested that your care partner not be given the details of your procedure findings, then the procedure report has been included in a sealed envelope for you to review at your convenience later.  YOU SHOULD EXPECT: Some feelings of bloating in the abdomen. Passage of more gas than usual.  Walking can help get rid of the air that was put into your GI tract during the procedure and reduce the bloating. If you had a lower endoscopy (such as a colonoscopy or flexible sigmoidoscopy) you may notice spotting of blood in your stool or on the toilet paper. If you underwent a bowel prep for your procedure, you may not have a normal bowel movement for a few days.  Please Note:  You might notice some irritation and congestion in your nose or some drainage.  This is from the oxygen used during your procedure.  There is no need for concern and it should clear up in a day or so.  SYMPTOMS TO REPORT IMMEDIATELY  Following upper endoscopy (EGD)  Vomiting of blood or coffee ground material  New chest pain or pain under the shoulder blades  Painful or persistently difficult swallowing  New shortness of breath  Fever of 100F or higher  Black, tarry-looking stools  For urgent or emergent issues, a gastroenterologist can be reached at any hour by calling [phone]. Do not use MyChart messaging for urgent concerns.    DIET:  We do recommend a small meal  at first, but then you may proceed to your regular diet.  Drink plenty of fluids but you should avoid alcoholic beverages for 24 hours.  ACTIVITY:  You should plan to take it easy for the rest of today and you should NOT DRIVE or use heavy machinery until tomorrow (because of the sedation medicines used during the test).    FOLLOW UP: Our staff will call the number listed on your records the next business day following your procedure.  We will call around 7:15- 8:00 am to check on you and address any questions or concerns that you may have regarding the information given to you following your procedure. If we do not reach you, we will leave a message.  If you develop any symptoms (ie: fever, flu-like symptoms, shortness of breath, cough etc.) before then, please call [phone].  If you test positive for Covid 19 in the 2 weeks post procedure, please call and report this information to [place].    If any biopsies were taken you will be contacted by phone or by letter within the next 1-3 weeks.  Please call us at [phone] if you have not heard about the biopsies in 3 weeks.    SIGNATURES/CONFIDENTIALITY: You and/or your care partner have signed paperwork which will be entered into your electronic medical record.  These signatures attest to the fact that that the information above on your After Visit Summary has been reviewed and is understood.  Full responsibility of  the confidentiality of this discharge information lies with you and/or your care-partner.

## 2021-09-06 NOTE — Progress Notes (Signed)
Pt's states no medical or surgical changes since previsit or office visit. 

## 2021-09-06 NOTE — Progress Notes (Unsigned)
Report to PACU, RN, vss, BBS= Clear.  

## 2021-09-06 NOTE — Progress Notes (Unsigned)
GASTROENTEROLOGY PROCEDURE H&P NOTE   Primary Care Physician: Mackie Pai, PA-C    Reason for Procedure:   GERD, gastritis, DU, EV screening  Plan:    EGD  Patient is appropriate for endoscopic procedure(s) in the ambulatory ([place]) setting.  The nature of the procedure, as well as the risks, benefits, and alternatives were carefully and thoroughly reviewed with the patient. Ample time for discussion and questions allowed. The patient understood, was satisfied, and agreed to proceed.     HPI: Steve Watson is a 81 y.o. male who presents for EGD for follow-up of gastritis, DU, GERD, and EV screening.   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 Authorizing Provider  aspirin 81 MG tablet Take 81 mg by mouth daily.   Yes [provider]  Cholecalciferol 2000 UNITS TABS Take 1 tablet (2,000 Units total) by mouth daily at 12 noon. 06/04/14  Yes Brunetta Jeans, PA-C  lactulose (CHRONULAC) 10 GM/15ML solution Take 30 mLs (20 g total) by mouth 2 (two) times daily. 06/29/21  Yes Regalado, Belkys A, MD  midodrine (PROAMATINE) 5 MG tablet Take 1 tablet (5 mg total) by mouth 3 (three) times daily with meals. 08/02/21  Yes Saguier, Percell Miller, PA-C  Multiple Vitamins-Minerals (VISION VITAMINS) TABS Take 2 tablets by mouth daily.   Yes [provider]   omeprazole (PRILOSEC) 10 MG capsule Take 10 mg by mouth daily as needed (heatrt burn).   Yes [provider]  pantoprazole (PROTONIX) 40 MG tablet Take 1 tablet (40 mg total) by mouth 2 (two) times daily. 06/29/21  Yes Regalado, Belkys A, MD  rifaximin (XIFAXAN) 550 MG TABS tablet Take 1 tablet (550 mg total) by mouth 2 (two) times daily. 06/29/21  Yes Regalado, Belkys A, MD  torsemide (DEMADEX) 20 MG tablet Take 1 tablet (20 mg total) by mouth daily. 08/23/21  Yes Saguier, Percell Miller, PA-C  Zinc 100 MG TABS Take 100 mg by mouth daily.   Yes [provider]    Current Outpatient Medications  Medication Sig Dispense Refill   aspirin 81 MG tablet Take 81 mg by mouth daily.     Cholecalciferol 2000 UNITS TABS Take 1 tablet (2,000 Units total) by mouth daily at 12 noon. 30 each 2   lactulose (CHRONULAC) 10 GM/15ML solution Take 30 mLs (20 g total) by mouth 2 (two) times daily. 236 mL 0   midodrine (PROAMATINE) 5 MG tablet Take 1 tablet (5 mg total) by mouth 3 (three) times daily with meals. 90 tablet 0   Multiple Vitamins-Minerals (VISION VITAMINS) TABS Take 2 tablets by mouth daily.     omeprazole (PRILOSEC) 10 MG capsule Take 10 mg by mouth daily as needed (heatrt burn).     pantoprazole (PROTONIX) 40 MG tablet Take 1 tablet (40 mg total) by mouth 2 (two) times daily. 60 tablet 1   rifaximin (XIFAXAN) 550 MG  TABS tablet Take 1 tablet (550 mg total) by mouth 2 (two) times daily. 42 tablet 0   torsemide (DEMADEX) 20 MG tablet Take 1 tablet (20 mg total) by mouth daily. 30 tablet 0   Zinc 100 MG TABS Take 100 mg by mouth daily.     Current Facility-Administered Medications  Medication Dose Route Frequency Provider Last Rate Last Admin   0.9 %  sodium chloride infusion  500 mL Intravenous Once Villa Burgin V, DO        Allergies as of 09/06/2021   (No Known Allergies)    Family History  Problem Relation Age of Onset   Hypertension Mother    Hypertension Father     Hypertension Brother    Diabetes Brother    Healthy Child    Healthy Grandchild     Social History   Socioeconomic History   Marital status: Married    Spouse name: Not on file   Number of children: Not on file   Years of education: Not on file   Highest education level: Not on file  Occupational History   Not on file  Tobacco Use   Smoking status: Never   Smokeless tobacco: Never   Tobacco comments:    NEVER USED TOBACCO  Vaping Use   Vaping Use: Never used  Substance and Sexual Activity   Alcohol use: No   Drug use: No   Sexual activity: Yes  Other Topics Concern   Not on file  Social History Narrative   2 Sons; grandson works in Recruitment consultant in [employer] for 8 years    Enjoys weightlifting    Social Determinants of Health   Financial Resource Strain: Low Risk  (02/24/2020)   Overall Financial Resource Strain (CARDIA)    Difficulty of Paying Living Expenses: Not hard at all  Food Insecurity: No Food Insecurity (02/24/2020)   Hunger Vital Sign    Worried About Running Out of Food in the Last Year: Never true    [place] in the Last Year: Never true  Transportation Needs: No Transportation Needs (02/24/2020)   PRAPARE - Hydrologist (Medical): No    Lack of Transportation (Non-Medical): No  Physical Activity: Sufficiently Active (02/24/2020)   Exercise Vital Sign    Days of Exercise per Week: 3 days    Minutes of Exercise per Session: 50 min  Stress: No Stress Concern Present (02/24/2020)   [place]    Feeling of Stress : Not at all  Social Connections: Moderately Isolated (02/24/2020)   Social Connection and Isolation Panel [NHANES]    Frequency of Communication with Friends and Family: More than three times a week    Frequency of Social Gatherings with Friends and Family: More than three times a week    Attends Religious Services: Never    Museum/gallery conservator or Organizations: No    Attends Archivist Meetings: Never    Marital Status: Married  Human resources officer Violence: Not At Risk (02/24/2020)   Humiliation, Afraid, Rape, and Kick questionnaire    Fear of Current or Ex-Partner: No    Emotionally Abused: No    Physically Abused: No    Sexually Abused: No    Physical Exam: Vital signs in last 24 hours: @BP  (!) 110/55   Pulse 80   Temp 98.7 F (37.1 C)   Ht 5' 8"  (1.727 m)  Wt 175 lb (79.4 kg)   SpO2 100%   BMI 26.61 kg/m  GEN: NAD EYE: Sclerae anicteric ENT: MMM CV: Non-tachycardic Pulm: CTA b/l GI: Soft, NT/ND NEURO:  Alert & Oriented x 3   Gerrit Heck, DO [facility]   09/06/2021 10:26 AM

## 2021-09-06 NOTE — Op Note (Signed)
[facility] Patient Name: Steve Watson Procedure Date: 09/06/2021 10:29 AM MRN: [id number] Endoscopist: Gerrit Heck , MD Age: 81 Referring MD:  Date of Birth: 05/07/1940 Gender: Male Account #: [id number] Procedure:                Upper GI endoscopy Indications:              Esophageal reflux, Follow-up of duodenal ulcer,                            Follow-up of esophageal varices, Follow-up of                            gastritis Medicines:                Monitored Anesthesia Care Procedure:                Pre-Anesthesia Assessment:                           - Prior to the procedure, a History and Physical                            was performed, and patient medications and                            allergies were reviewed. The patient's tolerance of                            previous anesthesia was also reviewed. The risks                            and benefits of the procedure and the sedation                            options and risks were discussed with the patient.                            All questions were answered, and informed consent                            was obtained. Prior Anticoagulants: The patient has                            taken no previous anticoagulant or antiplatelet                            agents. ASA Grade Assessment: III - A patient with                            severe systemic disease. After reviewing the risks                            and benefits, the patient was deemed in  satisfactory condition to undergo the procedure.                           After obtaining informed consent, the endoscope was                            passed under direct vision. Throughout the                            procedure, the patient's blood pressure, pulse, and                            oxygen saturations were monitored continuously. The                            Endoscope was introduced through the mouth, and                             advanced to the second part of duodenum. The upper                            GI endoscopy was accomplished without difficulty.                            The patient tolerated the procedure well. Scope In: Scope Out: Findings:                 Grade I varices were found in the lower third of                            the esophagus. They were 4 mm in largest diameter.                           The Z-line was regular and was found 40 cm from the                            incisors.                           Diffuse moderate inflammation characterized by                            congestion (edema) and erythema was found in the                            gastric fundus, in the gastric body and in the                            gastric antrum. Biopsies were taken with a cold                            forceps for histology. Estimated blood loss was  minimal.                           One non-bleeding duodenal ulcer with no stigmata of                            bleeding was found in the duodenal bulband into the                            c-sweep. The lesion was 6 mm in largest dimension.                            Biopsies were taken with a cold forceps for                            histology. Estimated blood loss was minimal.                           The second portion of the duodenum was normal. Complications:            No immediate complications. Estimated Blood Loss:     Estimated blood loss was minimal. Impression:               - Grade I esophageal varices.                           - Z-line regular, 40 cm from the incisors.                           - Gastritis. Biopsied.                           - Non-bleeding duodenal ulcer with no stigmata of                            bleeding. Biopsied.                           - Normal second portion of the duodenum. Recommendation:           - Patient has a contact number available  for                            emergencies. The signs and symptoms of potential                            delayed complications were discussed with the                            patient. Return to normal activities tomorrow.                            Written discharge instructions were provided to the                            patient.                           -  Resume previous diet.                           - Continue present medications.                           - Await pathology results.                           - Given persistent duodenal ulcer despite high dose                            Protonix, will make empiric change to Nexium                            (esomeprazole) 40 mg PO BID for 8 weeks.                           - Use sucralfate suspension 1 gram PO QID for 4                            weeks.                           - Follow-up in the GI clinic. Gerrit Heck, MD 09/06/2021 10:51:38 AM

## 2021-09-06 NOTE — Progress Notes (Signed)
Called to room to assist during endoscopic procedure.  Patient ID and intended procedure confirmed with present staff. Received instructions for my participation in the procedure from the performing physician.  

## 2021-09-07 ENCOUNTER — Telehealth: Payer: Self-pay

## 2021-09-07 ENCOUNTER — Telehealth: Payer: Self-pay | Admitting: *Deleted

## 2021-09-07 NOTE — Telephone Encounter (Signed)
Pt scheduled for f/u on 10/13/21 at 11:20 am with Dr. Bryan Lemma as requested on 8/14 EGD report. Pt verbalized understanding and had no other concerns at end of call.

## 2021-09-07 NOTE — Telephone Encounter (Signed)
  Follow up Call-     09/06/2021    9:12 AM  Call back number  Post procedure Call Back phone  # [phone]  Permission to leave phone message Yes     Patient questions:  Do you have a fever, pain , or abdominal swelling? No. Pain Score  0 *  Have you tolerated food without any problems? Yes.    Have you been able to return to your normal activities? Yes.    Do you have any questions about your discharge instructions: Diet   No. Medications  No. Follow up visit  No.  Do you have questions or concerns about your Care? No.  Actions: * If pain score is 4 or above: No action needed, pain <4.

## 2021-09-16 ENCOUNTER — Encounter: Payer: Self-pay | Admitting: Gastroenterology

## 2021-09-23 ENCOUNTER — Ambulatory Visit (INDEPENDENT_AMBULATORY_CARE_PROVIDER_SITE_OTHER): Payer: No Typology Code available for payment source | Admitting: Medical

## 2021-09-23 VITALS — BP 117/60 | HR 78 | Temp 98.0°F | Resp 18 | Ht 68.0 in | Wt 186.0 lb

## 2021-09-23 DIAGNOSIS — D696 Thrombocytopenia, unspecified: Secondary | ICD-10-CM

## 2021-09-23 DIAGNOSIS — D649 Anemia, unspecified: Secondary | ICD-10-CM

## 2021-09-23 DIAGNOSIS — I959 Hypotension, unspecified: Secondary | ICD-10-CM | POA: Diagnosis not present

## 2021-09-23 DIAGNOSIS — K269 Duodenal ulcer, unspecified as acute or chronic, without hemorrhage or perforation: Secondary | ICD-10-CM

## 2021-09-23 DIAGNOSIS — K7682 Hepatic encephalopathy: Secondary | ICD-10-CM

## 2021-09-23 DIAGNOSIS — K7469 Other cirrhosis of liver: Secondary | ICD-10-CM | POA: Diagnosis not present

## 2021-09-23 LAB — CBC WITH DIFFERENTIAL/PLATELET
Basophils Absolute: 0 10*3/uL (ref 0.0–0.1)
Basophils Relative: 1.8 % (ref 0.0–3.0)
Eosinophils Absolute: 0.1 10*3/uL (ref 0.0–0.7)
Eosinophils Relative: 3.6 % (ref 0.0–5.0)
HCT: 31.3 % — ABNORMAL LOW (ref 39.0–52.0)
Hemoglobin: 10.6 g/dL — ABNORMAL LOW (ref 13.0–17.0)
Lymphocytes Relative: 29 % (ref 12.0–46.0)
Lymphs Abs: 0.7 10*3/uL (ref 0.7–4.0)
MCHC: 34 g/dL (ref 30.0–36.0)
MCV: 101.3 fl — ABNORMAL HIGH (ref 78.0–100.0)
Monocytes Absolute: 0.4 10*3/uL (ref 0.1–1.0)
Monocytes Relative: 15.6 % — ABNORMAL HIGH (ref 3.0–12.0)
Neutro Abs: 1.2 10*3/uL — ABNORMAL LOW (ref 1.4–7.7)
Neutrophils Relative %: 50 % (ref 43.0–77.0)
Platelets: 54 10*3/uL — ABNORMAL LOW (ref 150.0–400.0)
RBC: 3.08 Mil/uL — ABNORMAL LOW (ref 4.22–5.81)
RDW: 15.3 % (ref 11.5–15.5)
WBC: 2.3 10*3/uL — ABNORMAL LOW (ref 4.0–10.5)

## 2021-09-23 LAB — COMPREHENSIVE METABOLIC PANEL
ALT: 18 U/L (ref 0–53)
AST: 34 U/L (ref 0–37)
Albumin: 2.5 g/dL — ABNORMAL LOW (ref 3.5–5.2)
Alkaline Phosphatase: 83 U/L (ref 39–117)
BUN: 8 mg/dL (ref 6–23)
CO2: 25 mEq/L (ref 19–32)
Calcium: 7.8 mg/dL — ABNORMAL LOW (ref 8.4–10.5)
Chloride: 110 mEq/L (ref 96–112)
Creatinine, Ser: 0.66 mg/dL (ref 0.40–1.50)
GFR: 89.22 mL/min (ref >60.00)
Glucose, Bld: 74 mg/dL (ref 70–99)
Potassium: 3.9 mEq/L (ref 3.5–5.1)
Sodium: 142 mEq/L (ref 135–145)
Total Bilirubin: 1.9 mg/dL — ABNORMAL HIGH (ref 0.2–1.2)
Total Protein: 5.1 g/dL — ABNORMAL LOW (ref 6.0–8.3)

## 2021-09-23 NOTE — Patient Instructions (Addendum)
For cirrhosis, anemia, low platelets, encephalapathy, hypotension(controlled)and duodenal ulcer. Conditions appear stable clinically.   Will get cbc and cmp. Ammonia future order to done when sees Dr. Marin Olp.   Continue Torsemide 20 mg daily and Aldactone 25 mg daily  Continue Lactulose 30 mg 3 times daily Added by GI MD recently (esomeprazole) 40 mg PO BID for 8 weeks.                           - Use sucralfate suspension 1 gram PO QID for 4                            weeks.  For hypotension continue midrodrine 5 mg tid.  Follow up date to be determined after lab review.

## 2021-09-23 NOTE — Progress Notes (Signed)
Subjective:    Patient ID: Steve Watson, male    DOB: [birth year], 81 y.o.   MRN: [id number]  HPI  Pt in for follow up.   Pt had recent egd by GI MD. Pt had  below findings.     No immediate complications. Estimated Blood Loss:     Estimated blood loss was minimal. Impression:               - Grade I esophageal varices.                           - Z-line regular, 40 cm from the incisors.                           - Gastritis. Biopsied.                           - Non-bleeding duodenal ulcer with no stigmata of                            bleeding.  - Resume previous diet.                           - Continue present medications.                           - Await pathology results.                           - Given persistent duodenal ulcer despite high dose                            Protonix, will make empiric change to Nexium                            (esomeprazole) 40 mg PO BID for 8 weeks.                           - Use sucralfate suspension 1 gram PO QID for 4                            weeks.                           - Follow-up in the GI clinic.  [name], hepatic encephalopathy, hepatic cirrhosis and esophagitis.  Last visit with GI PA visit in July below Assessment: 1.  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  Esophagitis/GERD: Found during recent EGD with repeat EGD recommended a month from now   Plan:   Continue Torsemide 20 mg daily and Aldactone 25 mg daily  Continue Lactulose 30 mg 3 times daily.  Discussed titration of this to 3-4 soft bowel movements a day.  Explained especially if they cannot afford the Rifaximin then this needs to be monitored closely.   Continue Pantoprazole 40 mg twice daily for now.  At time of EGD explained this may be decreased to once daily.  Patient is already scheduled for EGD with  Dr. Bryan Watson on 09/06/2021.  Reviewed risks, benefits, limitations and alternatives and  patient agrees to proceed.  [place] screening up-to-date given patient has had abdominal imaging and also an AFP during recent hospitalization.  Last ultrasound 06/25/2021.  Repeat recommended in 6 months. 7.  Patient to follow in clinic with [place] per recommendations from Dr. Bryan Watson after time of procedure.  Pt states feels well. No abdomen pain, no bloating, no bloody stools, no black stools and no confusion. Describes good mental status.   Bp controlled. On midodrine. No light headed episodes. No dizziness standing.   Review of Systems  Constitutional:  Negative for chills and fever.  HENT:  Negative for congestion, drooling and ear discharge.   Respiratory:  Negative for cough, chest tightness, shortness of breath and wheezing.   Gastrointestinal:  Negative for abdominal pain, constipation, nausea, rectal pain and vomiting.  Genitourinary:  Negative for dysuria and frequency.  Musculoskeletal:  Negative for back pain and neck pain.  Skin:  Negative for rash.  Neurological:  Negative for dizziness, syncope, speech difficulty, weakness and headaches.  Hematological:  Negative for adenopathy. Does not bruise/bleed easily.  Psychiatric/Behavioral:  Negative for behavioral problems and dysphoric mood.     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     Social History   Socioeconomic History   Marital status: Married    Spouse name: Not on file   Number of children: Not on file   Years of education: Not on file   Highest education level: Not on file  Occupational History   Not on file  Tobacco Use   Smoking status: Never   Smokeless tobacco: Never   Tobacco comments:    NEVER USED TOBACCO  Vaping Use   Vaping Use: Never used  Substance and Sexual Activity   Alcohol use: No   Drug use: No   Sexual activity: Yes  Other Topics Concern   Not on file  Social  History Narrative   2 Sons; grandson works in Recruitment consultant in [employer] for 8 years    Enjoys weightlifting    Social Determinants of Health   Financial Resource Strain: Low Risk  (02/24/2020)   Overall Financial Resource Strain (CARDIA)    Difficulty of Paying Living Expenses: Not hard at all  Food Insecurity: No Food Insecurity (02/24/2020)   Hunger Vital Sign    Worried About Running Out of Food in the Last Year: Never true    [place] in the Last Year: Never true  Transportation Needs: No Transportation Needs (02/24/2020)   PRAPARE - Hydrologist (Medical): No    Lack of Transportation (Non-Medical): No  Physical Activity: Sufficiently Active (02/24/2020)   Exercise Vital Sign    Days of Exercise per Week: 3 days    Minutes of Exercise per Session: 50 min  Stress: No Stress Concern Present (02/24/2020)   [place]    Feeling of Stress : Not at all  Social Connections: Moderately Isolated (02/24/2020)   Social Connection and Isolation Panel [NHANES]    Frequency of Communication with Friends and Family: More than three times a week    Frequency of Social Gatherings with Friends and Family: More than three times a week    Attends Religious Services: Never    Active Member  of Clubs or Organizations: No    Attends Archivist Meetings: Never    Marital Status: Married  Human resources officer Violence: Not At Risk (02/24/2020)   Humiliation, Afraid, Rape, and Kick questionnaire    Fear of Current or Ex-Partner: No    Emotionally Abused: No    Physically Abused: No    Sexually Abused: No    Past Surgical History:  Procedure Laterality Date   BIOPSY  06/27/2021   Procedure: BIOPSY;  Surgeon: Lavena Bullion, DO;  Location: [facility];  Service: Gastroenterology;;   ESOPHAGOGASTRODUODENOSCOPY (EGD) WITH PROPOFOL N/A 06/27/2021   Procedure: ESOPHAGOGASTRODUODENOSCOPY  (EGD) WITH PROPOFOL;  Surgeon: Lavena Bullion, DO;  Location: [facility];  Service: Gastroenterology;  Laterality: N/A;   NO PAST SURGERIES      Family History  Problem Relation Age of Onset   Hypertension Mother    Hypertension Father    Hypertension Brother    Diabetes Brother    Healthy Child    Healthy Grandchild     No Known Allergies  Current Outpatient Medications on File Prior to Visit  Medication Sig Dispense Refill   aspirin 81 MG tablet Take 81 mg by mouth daily.     Cholecalciferol 2000 UNITS TABS Take 1 tablet (2,000 Units total) by mouth daily at 12 noon. 30 each 2   esomeprazole (NEXIUM) 40 MG capsule Take 1 capsule (40 mg total) by mouth in the morning and at bedtime. 60 capsule 1   lactulose (CHRONULAC) 10 GM/15ML solution Take 30 mLs (20 g total) by mouth 2 (two) times daily. 236 mL 0   midodrine (PROAMATINE) 5 MG tablet Take 1 tablet (5 mg total) by mouth 3 (three) times daily with meals. 90 tablet 0   Multiple Vitamins-Minerals (VISION VITAMINS) TABS Take 2 tablets by mouth daily.     omeprazole (PRILOSEC) 10 MG capsule Take 10 mg by mouth daily as needed (heatrt burn).     rifaximin (XIFAXAN) 550 MG TABS tablet Take 1 tablet (550 mg total) by mouth 2 (two) times daily. 42 tablet 0   sucralfate (CARAFATE) 1 GM/10ML suspension Take 10 mLs (1 g total) by mouth 4 (four) times daily. 420 mL 2   torsemide (DEMADEX) 20 MG tablet Take 1 tablet (20 mg total) by mouth daily. 30 tablet 0   Zinc 100 MG TABS Take 100 mg by mouth daily.     No current facility-administered medications on file prior to visit.    BP 117/60   Pulse 78   Temp 98 F (36.7 C)   Resp 18   Ht 5' 8"  (1.727 m)   Wt 186 lb (84.4 kg)   SpO2 99%   BMI 28.28 kg/m        Objective:   Physical Exam  General- No acute distress. Pleasant patient. Neck- Full range of motion, no jvd Lungs- Clear, even and unlabored. Heart- regular rate and rhythm. Neurologic- CNII- XII grossly intact.   Abdomen- soft, non-tender, nondisteneded, +bs, no rebound or guarding.  Lower ext- symmetric pedal edema.      Assessment & Plan:   Patient Instructions  For cirrhosis, anemia, low platelets, encephalapathy, hypotension(controlled)and duodenal ulcer. Conditions appear stable clinically.   Will get cbc and cmp. Ammonia future order to done when sees Dr. Marin Olp.   Continue Torsemide 20 mg daily and Aldactone 25 mg daily  Continue Lactulose 30 mg 3 times daily Added by GI MD recently (esomeprazole) 40 mg PO BID for 8 weeks.                           -  Use sucralfate suspension 1 gram PO QID for 4                            weeks.  For hypotension continue midrodrine 5 mg tid.  Follow up date to be determined after lab review.

## 2021-09-30 ENCOUNTER — Telehealth: Payer: Self-pay | Admitting: *Deleted

## 2021-09-30 ENCOUNTER — Ambulatory Visit (INDEPENDENT_AMBULATORY_CARE_PROVIDER_SITE_OTHER): Payer: No Typology Code available for payment source | Admitting: *Deleted

## 2021-09-30 VITALS — BP 126/69 | HR 86 | Ht 68.0 in | Wt 174.2 lb

## 2021-09-30 DIAGNOSIS — Z0000 Encounter for general adult medical examination without abnormal findings: Secondary | ICD-10-CM | POA: Diagnosis not present

## 2021-09-30 NOTE — Telephone Encounter (Signed)
Per our conversation, pt would like review into the diabetes dx on his chart.  Last a1c was 4.7 on 04/12/21.

## 2021-09-30 NOTE — Patient Instructions (Signed)
[name] , Thank you for taking time to come for your Medicare Wellness Visit. I appreciate your ongoing commitment to your health goals. Please review the following plan we discussed and let me know if I can assist you in the future.   These are the goals we discussed:  Goals      Patient Stated     Maintain current level of activity        This is a list of the screening recommended for you and due dates:  Health Maintenance  Topic Date Due   Complete foot exam   Never done   Eye exam for diabetics  Never done   Urine Protein Check  Never done   Tetanus Vaccine  Never done   Zoster (Shingles) Vaccine (1 of 2) Never done   COVID-19 Vaccine (3 - Moderna risk series) 12/15/2019   Flu Shot  Never done   Hemoglobin A1C  10/13/2021   Pneumonia Vaccine  Completed   Hepatitis C Screening: USPSTF Recommendation to screen - Ages 41-79 yo.  Completed   HPV Vaccine  Aged Out       Next appointment: Follow up in one year for your annual wellness visit.   Preventive Care 22 Years and Older, Male Preventive care refers to lifestyle choices and visits with your health care provider that can promote health and wellness. What does preventive care include? A yearly physical exam. This is also called an annual well check. Dental exams once or twice a year. Routine eye exams. Ask your health care provider how often you should have your eyes checked. Personal lifestyle choices, including: Daily care of your teeth and gums. Regular physical activity. Eating a healthy diet. Avoiding tobacco and drug use. Limiting alcohol use. Practicing safe sex. Taking low doses of aspirin every day. Taking vitamin and mineral supplements as recommended by your health care provider. What happens during an annual well check? The services and screenings done by your health care provider during your annual well check will depend on your age, overall health, lifestyle risk factors, and family history of  disease. Counseling  Your health care provider may ask you questions about your: Alcohol use. Tobacco use. Drug use. Emotional well-being. Home and relationship well-being. Sexual activity. Eating habits. History of falls. Memory and ability to understand (cognition). Work and work Statistician. Screening  You may have the following tests or measurements: Height, weight, and BMI. Blood pressure. Lipid and cholesterol levels. These may be checked every 5 years, or more frequently if you are over 25 years old. Skin check. Lung cancer screening. You may have this screening every year starting at age 7 if you have a 30-pack-year history of smoking and currently smoke or have quit within the past 15 years. Fecal occult blood test (FOBT) of the stool. You may have this test every year starting at age 57. Flexible sigmoidoscopy or colonoscopy. You may have a sigmoidoscopy every 5 years or a colonoscopy every 10 years starting at age 62. Prostate cancer screening. Recommendations will vary depending on your family history and other risks. Hepatitis C blood test. Hepatitis B blood test. Sexually transmitted disease (STD) testing. Diabetes screening. This is done by checking your blood sugar (glucose) after you have not eaten for a while (fasting). You may have this done every 1-3 years. Abdominal aortic aneurysm (AAA) screening. You may need this if you are a current or former smoker. Osteoporosis. You may be screened starting at age 53 if you are  at high risk. Talk with your health care provider about your test results, treatment options, and if necessary, the need for more tests. Vaccines  Your health care provider may recommend certain vaccines, such as: Influenza vaccine. This is recommended every year. Tetanus, diphtheria, and acellular pertussis (Tdap, Td) vaccine. You may need a Td booster every 10 years. Zoster vaccine. You may need this after age 4. Pneumococcal 13-valent  conjugate (PCV13) vaccine. One dose is recommended after age 46. Pneumococcal polysaccharide (PPSV23) vaccine. One dose is recommended after age 80. Talk to your health care provider about which screenings and vaccines you need and how often you need them. This information is not intended to replace advice given to you by your health care provider. Make sure you discuss any questions you have with your health care provider. Document Released: 02/06/2015 Document Revised: 09/30/2015 Document Reviewed: 11/11/2014 Elsevier Interactive Patient Education  2017 [place] Prevention in the Home Falls can cause injuries. They can happen to people of all ages. There are many things you can do to make your home safe and to help prevent falls. What can I do on the outside of my home? Regularly fix the edges of walkways and driveways and fix any cracks. Remove anything that might make you trip as you walk through a door, such as a raised step or threshold. Trim any bushes or trees on the path to your home. Use bright outdoor lighting. Clear any walking paths of anything that might make someone trip, such as rocks or tools. Regularly check to see if handrails are loose or broken. Make sure that both sides of any steps have handrails. Any raised decks and porches should have guardrails on the edges. Have any leaves, snow, or ice cleared regularly. Use sand or salt on walking paths during winter. Clean up any spills in your garage right away. This includes oil or grease spills. What can I do in the bathroom? Use night lights. Install grab bars by the toilet and in the tub and shower. Do not use towel bars as grab bars. Use non-skid mats or decals in the tub or shower. If you need to sit down in the shower, use a plastic, non-slip stool. Keep the floor dry. Clean up any water that spills on the floor as soon as it happens. Remove soap buildup in the tub or shower regularly. Attach bath mats  securely with double-sided non-slip rug tape. Do not have throw rugs and other things on the floor that can make you trip. What can I do in the bedroom? Use night lights. Make sure that you have a light by your bed that is easy to reach. Do not use any sheets or blankets that are too big for your bed. They should not hang down onto the floor. Have a firm chair that has side arms. You can use this for support while you get dressed. Do not have throw rugs and other things on the floor that can make you trip. What can I do in the kitchen? Clean up any spills right away. Avoid walking on wet floors. Keep items that you use a lot in easy-to-reach places. If you need to reach something above you, use a strong step stool that has a grab bar. Keep electrical cords out of the way. Do not use floor polish or wax that makes floors slippery. If you must use wax, use non-skid floor wax. Do not have throw rugs and other things on the floor  that can make you trip. What can I do with my stairs? Do not leave any items on the stairs. Make sure that there are handrails on both sides of the stairs and use them. Fix handrails that are broken or loose. Make sure that handrails are as long as the stairways. Check any carpeting to make sure that it is firmly attached to the stairs. Fix any carpet that is loose or worn. Avoid having throw rugs at the top or bottom of the stairs. If you do have throw rugs, attach them to the floor with carpet tape. Make sure that you have a light switch at the top of the stairs and the bottom of the stairs. If you do not have them, ask someone to add them for you. What else can I do to help prevent falls? Wear shoes that: Do not have high heels. Have rubber bottoms. Are comfortable and fit you well. Are closed at the toe. Do not wear sandals. If you use a stepladder: Make sure that it is fully opened. Do not climb a closed stepladder. Make sure that both sides of the stepladder  are locked into place. Ask someone to hold it for you, if possible. Clearly mark and make sure that you can see: Any grab bars or handrails. First and last steps. Where the edge of each step is. Use tools that help you move around (mobility aids) if they are needed. These include: Canes. Walkers. Scooters. Crutches. Turn on the lights when you go into a dark area. Replace any light bulbs as soon as they burn out. Set up your furniture so you have a clear path. Avoid moving your furniture around. If any of your floors are uneven, fix them. If there are any pets around you, be aware of where they are. Review your medicines with your doctor. Some medicines can make you feel dizzy. This can increase your chance of falling. Ask your doctor what other things that you can do to help prevent falls. This information is not intended to replace advice given to you by your health care provider. Make sure you discuss any questions you have with your health care provider. Document Released: 11/06/2008 Document Revised: 06/18/2015 Document Reviewed: 02/14/2014 Elsevier Interactive Patient Education  2017 [employer].

## 2021-09-30 NOTE — Progress Notes (Addendum)
Subjective:   Steve Watson is a 81 y.o. male who presents for Medicare Annual/Subsequent preventive examination.  Review of Systems    Defer to PCP Cardiac Risk Factors include: advanced age (>2mn, >>38women);diabetes mellitus;male gender     Objective:    Today's Vitals   09/30/21 1021  BP: 126/69  Pulse: 86  Weight: 174 lb 3.2 oz (79 kg)  Height: 5' 8"  (1.727 m)   Body mass index is 26.49 kg/m.     09/30/2021   10:26 AM 06/28/2021    4:33 PM 06/25/2021    8:52 AM 06/25/2021    8:06 AM 04/09/2021    6:36 PM 07/07/2020    3:38 PM 06/26/2020    8:15 AM  Advanced Directives  Does Patient Have a Medical Advance Directive? No No No Unable to assess, patient is non-responsive or altered mental status No No No  Would patient like information on creating a medical advance directive? No - Patient declined No - Patient declined No - Patient declined;No - Guardian declined  No - Patient declined  No - Patient declined    Current Medications (verified) Outpatient Encounter Medications as of 09/30/2021  Medication Sig   aspirin 81 MG tablet Take 81 mg by mouth daily.   Cholecalciferol 2000 UNITS TABS Take 1 tablet (2,000 Units total) by mouth daily at 12 noon.   esomeprazole (NEXIUM) 40 MG capsule Take 1 capsule (40 mg total) by mouth in the morning and at bedtime.   lactulose (CHRONULAC) 10 GM/15ML solution Take 30 mLs (20 g total) by mouth 2 (two) times daily.   midodrine (PROAMATINE) 5 MG tablet Take 1 tablet (5 mg total) by mouth 3 (three) times daily with meals.   Multiple Vitamins-Minerals (VISION VITAMINS) TABS Take 2 tablets by mouth daily.   omeprazole (PRILOSEC) 10 MG capsule Take 10 mg by mouth daily as needed (heatrt burn).   rifaximin (XIFAXAN) 550 MG TABS tablet Take 1 tablet (550 mg total) by mouth 2 (two) times daily.   sucralfate (CARAFATE) 1 GM/10ML suspension Take 10 mLs (1 g total) by mouth 4 (four) times daily.   torsemide (DEMADEX) 20 MG tablet Take 1 tablet (20 mg  total) by mouth daily.   Zinc 100 MG TABS Take 100 mg by mouth daily.   No facility-administered encounter medications on file as of 09/30/2021.    Allergies (verified) Patient has no known allergies.   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   Past Surgical History:  Procedure Laterality Date   BIOPSY  06/27/2021   Procedure: BIOPSY;  Surgeon: CLavena Bullion DO;  Location: [facility];  Service: Gastroenterology;;   ESOPHAGOGASTRODUODENOSCOPY (EGD) WITH PROPOFOL N/A 06/27/2021   Procedure: ESOPHAGOGASTRODUODENOSCOPY (EGD) WITH PROPOFOL;  Surgeon: CLavena Bullion DO;  Location: [facility];  Service: Gastroenterology;  Laterality: N/A;   NO PAST SURGERIES     Family History  Problem Relation Age of Onset   Hypertension Mother    Hypertension Father    Hypertension Brother    Diabetes Brother    Healthy Child    Healthy Grandchild    Social History   Socioeconomic History   Marital status: Married    Spouse name: Not on file   Number of children: Not on file   Years of education: Not on file   Highest education level: Not on file  Occupational  History   Not on file  Tobacco Use   Smoking status: Never   Smokeless tobacco: Never   Tobacco comments:    NEVER USED TOBACCO  Vaping Use   Vaping Use: Never used  Substance and Sexual Activity   Alcohol use: No   Drug use: No   Sexual activity: Yes  Other Topics Concern   Not on file  Social History Narrative   2 Sons; grandson works in Recruitment consultant in [employer] for 8 years    Enjoys weightlifting    Social Determinants of Health   Financial Resource Strain: Low Risk  (02/24/2020)   Overall Financial Resource Strain (CARDIA)    Difficulty of Paying Living Expenses: Not hard at all  Food Insecurity: No Food Insecurity (02/24/2020)   Hunger Vital Sign    Worried About  Running Out of Food in the Last Year: Never true    Ran Out of Food in the Last Year: Never true  Transportation Needs: No Transportation Needs (02/24/2020)   PRAPARE - Hydrologist (Medical): No    Lack of Transportation (Non-Medical): No  Physical Activity: Sufficiently Active (02/24/2020)   Exercise Vital Sign    Days of Exercise per Week: 3 days    Minutes of Exercise per Session: 50 min  Stress: No Stress Concern Present (02/24/2020)   [place]    Feeling of Stress : Not at all  Social Connections: Moderately Isolated (02/24/2020)   Social Connection and Isolation Panel [NHANES]    Frequency of Communication with Friends and Family: More than three times a week    Frequency of Social Gatherings with Friends and Family: More than three times a week    Attends Religious Services: Never    Marine scientist or Organizations: No    Attends Music therapist: Never    Marital Status: Married    Tobacco Counseling Counseling given: Not Answered Tobacco comments: NEVER USED TOBACCO   Clinical Intake:  Pre-visit preparation completed: Yes  Pain : No/denies pain     Diabetes: No CBG done?: No Did pt. bring in CBG monitor from home?: No  How often do you need to have someone help you when you read instructions, pamphlets, or other written materials from your doctor or pharmacy?: 1 - Never  Diabetic? Yes Nutrition Risk Assessment:  Has the patient had any N/V/D within the last 2 months?  No  Does the patient have any non-healing wounds?  No  Has the patient had any unintentional weight loss or weight gain?  No   Diabetes:  Is the patient diabetic?  Yes  If diabetic, was a CBG obtained today?  No  Did the patient bring in their glucometer from home?  No  How often do you monitor your CBG's? never.   Financial Strains and Diabetes Management:  Are you having  any financial strains with the device, your supplies or your medication? No .  Does the patient want to be seen by Chronic Care Management for management of their diabetes?  No  Would the patient like to be referred to a Nutritionist or for Diabetic Management?  No   Diabetic Exams:  Diabetic Eye Exam: Completed 2 years ago Diabetic Foot Exam: Overdue, Pt has been advised about the importance in completing this exam. Pt is scheduled for diabetic foot exam on N/a.   Interpreter Needed?: No  Information entered by ::  Beatris Ship, [place]   Activities of Daily Living    09/30/2021   10:27 AM 06/28/2021    4:33 PM  In your present state of health, do you have any difficulty performing the following activities:  Hearing? 0 1  Vision? 0 0  Difficulty concentrating or making decisions? 0 0  Walking or climbing stairs? 0 1  Dressing or bathing? 0 0  Doing errands, shopping? 0 1  Preparing Food and eating ? N   Using the Toilet? N   In the past six months, have you accidently leaked urine? N   Do you have problems with loss of bowel control? N   Managing your Medications? N   Managing your Finances? N   Housekeeping or managing your Housekeeping? N     Patient Care Team: Saguier, Iris Pert as PCP - General (Internal Medicine) Volanda Napoleon, MD as Consulting Physician (Oncology)  Indicate any recent Medical Services you may have received from other than [facility] providers in the past year (date may be approximate).     Assessment:   This is a routine wellness examination for [facility].  Hearing/Vision screen No results found.  Dietary issues and exercise activities discussed: Current Exercise Habits: Home exercise routine, Type of exercise: walking;strength training/weights, Time (Minutes): 20, Frequency (Times/Week): 3 (walk every day, lifts weights 3x week), Weekly Exercise (Minutes/Week): 60, Intensity: Mild, Exercise limited by: None identified   Goals Addressed              This Visit's Progress    Patient Stated   On track    Maintain current level of activity       Depression Screen    09/30/2021   10:34 AM 04/12/2021   10:38 AM 02/24/2020    2:08 PM 02/20/2019    9:24 AM 07/12/2018   10:54 AM 08/08/2017    8:24 AM 02/06/2015    9:31 AM  PHQ 2/9 Scores  PHQ - 2 Score 0 0 0 0 0 0 0  PHQ- 9 Score     0      Fall Risk    09/30/2021   10:27 AM 04/12/2021   10:38 AM 02/24/2020    2:07 PM 02/20/2019    9:24 AM 08/08/2017    8:24 AM  Fall Risk   Falls in the past year? 0 0 0 0 No  Number falls in past yr: 0 0 0 0   Injury with Fall? 0 0 0 0   Risk for fall due to : No Fall Risks      Follow up Falls evaluation completed Falls evaluation completed Falls prevention discussed Falls evaluation completed;Education provided;Falls prevention discussed     FALL RISK PREVENTION PERTAINING TO THE HOME:  Any stairs in or around the home? Yes  If so, are there any without handrails? Yes  Home free of loose throw rugs in walkways, pet beds, electrical cords, etc? Yes  Adequate lighting in your home to reduce risk of falls? Yes   ASSISTIVE DEVICES UTILIZED TO PREVENT FALLS:  Life alert? No  Use of a cane, walker or w/c? No  Grab bars in the bathroom? Yes  Shower chair or bench in shower? No  Elevated toilet seat or a handicapped toilet? Yes   TIMED UP AND GO:  Was the test performed? Yes .  Length of time to ambulate 10 feet: 12 sec.   Gait slow and steady without use of assistive device  Cognitive Function:  08/08/2017    8:50 AM  MMSE - Mini Mental State Exam  Orientation to time 5  Orientation to Place 4  Registration 3  Attention/ Calculation 4  Recall 1  Language- name 2 objects 2  Language- repeat 1  Language- follow 3 step command 3  Language- read & follow direction 1  Write a sentence 1  Copy design 1  Total score 26        09/30/2021   10:37 AM 02/20/2019    9:23 AM  6CIT Screen  What Year? 0 points 0 points  What month? 0  points 0 points  What time? 0 points 0 points  Count back from 20 2 points 0 points  Months in reverse 2 points 0 points  Repeat phrase 2 points 0 points  Total Score 6 points 0 points    Immunizations Immunization History  Administered Date(s) Administered   Moderna Sars-Covid-2 Vaccination 10/20/2019, 11/17/2019   Pneumococcal Conjugate-13 02/06/2015, 08/08/2017   Pneumococcal Polysaccharide-23 02/20/2019    TDAP status: Due, Education has been provided regarding the importance of this vaccine. Advised may receive this vaccine at local pharmacy or Health Dept. Aware to provide a copy of the vaccination record if obtained from local pharmacy or Health Dept. Verbalized acceptance and understanding.  Flu Vaccine status: Declined, Education has been provided regarding the importance of this vaccine but patient still declined. Advised may receive this vaccine at local pharmacy or Health Dept. Aware to provide a copy of the vaccination record if obtained from local pharmacy or Health Dept. Verbalized acceptance and understanding.  Pneumococcal vaccine status: Up to date  Covid-19 vaccine status: Information provided on how to obtain vaccines.   Qualifies for Shingles Vaccine? Yes   Zostavax completed No   Shingrix Completed?: No.    Education has been provided regarding the importance of this vaccine. Patient has been advised to call insurance company to determine out of pocket expense if they have not yet received this vaccine. Advised may also receive vaccine at local pharmacy or Health Dept. Verbalized acceptance and understanding.  Screening Tests Health Maintenance  Topic Date Due   FOOT EXAM  Never done   OPHTHALMOLOGY EXAM  Never done   URINE MICROALBUMIN  Never done   TETANUS/TDAP  Never done   Zoster Vaccines- Shingrix (1 of 2) Never done   COVID-19 Vaccine (3 - Moderna risk series) 12/15/2019   INFLUENZA VACCINE  Never done   HEMOGLOBIN A1C  10/13/2021   Pneumonia Vaccine  76+ Years old  Completed   Hepatitis C Screening  Completed   HPV VACCINES  Aged Out    Health Maintenance  Health Maintenance Due  Topic Date Due   FOOT EXAM  Never done   OPHTHALMOLOGY EXAM  Never done   URINE MICROALBUMIN  Never done   TETANUS/TDAP  Never done   Zoster Vaccines- Shingrix (1 of 2) Never done   COVID-19 Vaccine (3 - Moderna risk series) 12/15/2019   INFLUENZA VACCINE  Never done    Colorectal cancer screening: No longer required. Pt does not want to do a colonoscopy.  Lung Cancer Screening: (Low Dose CT Chest recommended if Age 36-80 years, 30 pack-year currently smoking OR have quit w/in 15years.) does not qualify.   Lung Cancer Screening Referral: N/a  Additional Screening:  Hepatitis C Screening: does qualify; Completed 06/26/21  Vision Screening: Recommended annual ophthalmology exams for early detection of glaucoma and other disorders of the eye. Is the patient up to date  with their annual eye exam?  No  Who is the provider or what is the name of the office in which the patient attends annual eye exams? [facility] If pt is not established with a provider, would they like to be referred to a provider to establish care? No .   Dental Screening: Recommended annual dental exams for proper oral hygiene  Community Resource Referral / Chronic Care Management: CRR required this visit?  No   CCM required this visit?  No      Plan:     I have personally reviewed and noted the following in the patient's chart:   Medical and social history Use of alcohol, tobacco or illicit drugs  Current medications and supplements including opioid prescriptions. Patient is not currently taking opioid prescriptions. Functional ability and status Nutritional status Physical activity Advanced directives List of other physicians Hospitalizations, surgeries, and ER visits in previous 12 months Vitals Screenings to include cognitive, depression, and falls Referrals  and appointments  In addition, I have reviewed and discussed with patient certain preventive protocols, quality metrics, and best practice recommendations. A written personalized care plan for preventive services as well as general preventive health recommendations were provided to patient.     Beatris Ship, [place]   09/30/2021   Nurse Notes: None  Review and Agree with assessment & plan of CMA.  Mackie Pai, PA-C   Discussed with CMA how diabetes got on pt chart. On brief review looks like was placed by [facility]. I don't see a1c at or above 6.5.

## 2021-10-01 NOTE — Telephone Encounter (Signed)
DM modifiers removed from HM.  DM diagnosis removed by PCP.

## 2021-10-11 ENCOUNTER — Other Ambulatory Visit: Payer: Self-pay | Admitting: Medical

## 2021-10-13 ENCOUNTER — Ambulatory Visit (INDEPENDENT_AMBULATORY_CARE_PROVIDER_SITE_OTHER): Payer: No Typology Code available for payment source | Admitting: Gastroenterology

## 2021-10-13 ENCOUNTER — Encounter: Payer: Self-pay | Admitting: Gastroenterology

## 2021-10-13 VITALS — BP 110/62 | HR 68 | Ht 68.0 in | Wt 173.2 lb

## 2021-10-13 DIAGNOSIS — D696 Thrombocytopenia, unspecified: Secondary | ICD-10-CM

## 2021-10-13 DIAGNOSIS — K219 Gastro-esophageal reflux disease without esophagitis: Secondary | ICD-10-CM | POA: Diagnosis not present

## 2021-10-13 DIAGNOSIS — K269 Duodenal ulcer, unspecified as acute or chronic, without hemorrhage or perforation: Secondary | ICD-10-CM

## 2021-10-13 DIAGNOSIS — K766 Portal hypertension: Secondary | ICD-10-CM

## 2021-10-13 DIAGNOSIS — I8510 Secondary esophageal varices without bleeding: Secondary | ICD-10-CM | POA: Diagnosis not present

## 2021-10-13 DIAGNOSIS — K7581 Nonalcoholic steatohepatitis (NASH): Secondary | ICD-10-CM | POA: Diagnosis not present

## 2021-10-13 DIAGNOSIS — K3189 Other diseases of stomach and duodenum: Secondary | ICD-10-CM

## 2021-10-13 MED ORDER — ESOMEPRAZOLE MAGNESIUM 40 MG PO CPDR: 40.0000 mg | DELAYED_RELEASE_CAPSULE | Freq: Every day | ORAL | 3 refills | Status: DC

## 2021-10-13 NOTE — Progress Notes (Signed)
Chief Complaint:    GERD  GI History: Mr. Tonkinson is a 81 year old male with a history of [place] cirrhosis, GERD, HTN, leukopenia, follows in the GI clinic for the following:  Cirrhosis Evaluation: - Etiology: NASH.  Diagnosed during inpatient admission for encephalopathy in 06/2021. - Complications: Hepatic encephalopathy, thrombocytopenia, esophageal varices, PHG - HCC screening: UTD - Variceal screening: EGD 06/2021: Grade 1 esophageal varices, portal hypertensive gastropathy - Serologic evaluation: Completed 06/2021 and normal - Viral hepatitis vaccination: Check immunity status at follow-up - Flu vaccine: To obtain when available this fall - Liver biopsy: N/A - Medications: Rifaximin 550 mg bid, lactulose 20 g bid, Aldactone 25 mg/daily, torsemide 20 mg/day, midodrine 5 mg tid - MELD:  - Child Pugh score:  - 06/26/2021 hospital admission for hepatic encephalopathy.  Started rifaximin/lactulose.  Noted to have chronic macrocytic anemia - 06/26/2021: RUQ [place]: mildly nodular liver contour with coarse echotexture.  Normal AFP - 06/27/2021: EGD: Grade 1 esophageal varices, gastritis and nonbleeding duodenal ulcers.  Recommended starting Pantoprazole 40 p.o. twice daily x8 weeks and then reduce to 40 mg daily - 07/15/2021: Patient seen at the ED at [facility] for confusion.  At that time ammonia 208.  At that time told to take 30 mL of lactulose 3 times daily. - 07/17/2021-07/22/2021: Patient admitted to [facility] for "ambulatory dysfunction".  Described generalized weakness and bilateral lower extremity edema.  He had been started on albumin and a Lasix drip and nephrology followed for managing fluid overload.  His diuretics were adjusted.  At discharge kept on Midodrine 5 mg 3 times daily for 30 days.  Aldactone 12.5 mg daily for 30 days and Torsemide 20 mg daily for 30 days. - 07/24/2021: Follow-up in Internal Medicine clinic.  At that time his labs were stable and his Aldactone was increased to 25 mg  daily. - 07/28/2021: Follow-up in Internal Medicine clinic via telemedicine and had continued on Midodrine and Aldactone 25 mg daily. - 08/04/2021: Follow-up in GI clinic.  Was not taking rifaximin due to cost ($2000).  Otherwise was taking medications as prescribed.  No AMS. - 09/06/2021: EGD: Grade 1 esophageal varices, normal Z-line at 40 cm.  Moderate portal hypertensive gastropathy (path benign), 6 mm clean-based ulcer in duodenum (path: Peptic duodenitis).  Treated with Nexium 40 mg twice daily x8 weeks, sucralfate x4 weeks  HPI:     Patient is a 81 y.o. male presenting to the Gastroenterology Clinic for follow-up.  Was last seen in his clinic on 08/04/2021 with subsequent EGD on 09/06/2021 as outlined above.  His main issue today is nocturnal reflux symptoms.  Not having daytime symptoms.  Reflux symptoms wake him from sleep.  He and his wife state there was confusion regarding the Nexium that was prescribed at the time of upper endoscopy.  He did take Carafate as prescribed, but no Nexium was available to him.  Instead, has been taking Prilosec 10 mg, which he takes prn, approx 1 night/week.   Otherwise no recent confusion per wife.  He does have a referral to Dr. Marin Olp, with appointment scheduled 10/22/2021 for evaluation of pancytopenia.   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     Cholecalciferol 2000 UNITS TABS Take 1 tablet (2,000 Units total) by mouth daily at 12 noon. 30 each 2   esomeprazole (NEXIUM) 40 MG capsule Take 1 capsule (40 mg total) by  mouth in the morning and at bedtime. 60 capsule 1   lactulose (CHRONULAC) 10 GM/15ML solution Take 30 mLs (20 g total) by mouth 2 (two) times daily. 236 mL 0   midodrine (PROAMATINE) 5 MG tablet Take 1 tablet (5 mg total) by mouth 3 (three) times daily with meals. 90 tablet 0   Multiple Vitamins-Minerals (VISION VITAMINS) TABS Take 2 tablets by mouth daily.     omeprazole (PRILOSEC) 10 MG capsule Take 10 mg by mouth daily as needed (heatrt burn).     spironolactone (ALDACTONE) 25 MG tablet Take 25 mg by mouth daily.     sucralfate (CARAFATE) 1 GM/10ML suspension Take 10 mLs (1 g total) by mouth 4 (four) times daily. 420 mL 2   torsemide (DEMADEX) 20 MG tablet Take 1 tablet by mouth once daily 30 tablet 0   Zinc 100 MG TABS Take 100 mg by mouth daily.     No current facility-administered medications for this visit.    Physical Exam:     BP 110/62   Pulse 68   Ht 5' 8"  (1.727 m)   Wt 173 lb 3.2 oz (78.6 kg)   SpO2 98%   BMI 26.33 kg/m   GENERAL:  Pleasant male in NAD PSYCH: : Cooperative, normal affect EENT:  conjunctiva pink, mucous membranes moist, neck supple without masses NEURO: Alert and oriented x 3, no focal neurologic deficits   IMPRESSION and PLAN:    1) GERD - Start Nexium 40 mg.  Take 30-60 minutes prior to dinner.  If symptoms resolve after 4 weeks, discussed reducing to 20 mg and continue long-term - Avoid eating within 3 hours of bedtime - Avoid exacerbating foods  2) NASH cirrhosis 3) Esophageal varices 4) Portal hypertensive gastropathy 5) Thrombocytopenia 6) Hepatic encephalopathy - UTD on [place] screening.  Repeat imaging in December - Continue lactulose - Continue torsemide, Aldactone, midodrine - Repeat labs at next appointment to calculate MELD, CTP - Repeat EGD in 2 years for variceal surveillance  7) Leukopenia 8) Anemia - Has evaluation in the Hematology clinic next week  RTC in 3-6 months or sooner prn  I spent 35 minutes of time, including  in depth chart review, independent review of results as outlined above, communicating results with the patient directly, face-to-face time with the patient, coordinating care, and ordering studies and medications as appropriate, and documentation.            Lavena Bullion ,DO, FACG 10/13/2021, 12:05 PM

## 2021-10-13 NOTE — Patient Instructions (Addendum)
If you are age 79 or older, your body mass index should be between 23-30. Your Body mass index is 26.33 kg/m. If this is out of the aforementioned range listed, please consider follow up with your Primary Care Provider.  If you are age 49 or younger, your body mass index should be between 19-25. Your Body mass index is 26.33 kg/m. If this is out of the aformentioned range listed, please consider follow up with your Primary Care Provider.   __________________________________________________________  The [facility] providers would like to encourage you to use [facility] to communicate with providers for non-urgent requests or questions.  Due to long hold times on the telephone, sending your provider a message by [facility] may be a faster and more efficient way to get a response.  Please allow 48 business hours for a response.  Please remember that this is for non-urgent requests.   We have sent the following medications to your pharmacy for you to pick up at your convenience: Nexium  Stop Prilosec.  Take Nexium 40 mg daily before dinner.  Please follow up in 6 months. Give [place] a call at [phone] to schedule an appointment.    Thank you for choosing me and [facility].  Vito Cirigliano, D.O.

## 2021-10-14 ENCOUNTER — Telehealth: Payer: Self-pay | Admitting: Medical

## 2021-10-14 MED ORDER — MIDODRINE HCL 5 MG PO TABS: 5.0000 mg | ORAL_TABLET | Freq: Three times a day (TID) | ORAL | 0 refills | Status: DC

## 2021-10-14 NOTE — Telephone Encounter (Signed)
Rx sent 

## 2021-10-14 NOTE — Telephone Encounter (Signed)
Medication: midodrine (PROAMATINE) 5 MG tablet [[id number]]  Has the patient contacted their pharmacy? Yes.    Pharmacy already contacted [place] per patient.   Preferred Pharmacy (with phone number or street name): [place], [place] [address]  Phone:  [phone]  Fax:  [phone]   Agent: Please be advised that RX refills may take up to 3 business days. We ask that you follow-up with your pharmacy.

## 2021-10-19 DIAGNOSIS — I10 Essential (primary) hypertension: Secondary | ICD-10-CM | POA: Diagnosis not present

## 2021-10-19 DIAGNOSIS — Z6826 Body mass index (BMI) 26.0-26.9, adult: Secondary | ICD-10-CM | POA: Diagnosis not present

## 2021-10-19 DIAGNOSIS — I700 Atherosclerosis of aorta: Secondary | ICD-10-CM | POA: Diagnosis not present

## 2021-10-19 DIAGNOSIS — E663 Overweight: Secondary | ICD-10-CM | POA: Diagnosis not present

## 2021-10-19 DIAGNOSIS — K219 Gastro-esophageal reflux disease without esophagitis: Secondary | ICD-10-CM | POA: Diagnosis not present

## 2021-10-19 DIAGNOSIS — Z008 Encounter for other general examination: Secondary | ICD-10-CM | POA: Diagnosis not present

## 2021-10-22 ENCOUNTER — Inpatient Hospital Stay (HOSPITAL_BASED_OUTPATIENT_CLINIC_OR_DEPARTMENT_OTHER): Payer: No Typology Code available for payment source | Admitting: Hematology & Oncology

## 2021-10-22 ENCOUNTER — Inpatient Hospital Stay: Payer: No Typology Code available for payment source | Attending: Hematology & Oncology

## 2021-10-22 ENCOUNTER — Encounter: Payer: Self-pay | Admitting: Hematology & Oncology

## 2021-10-22 VITALS — BP 135/72 | HR 85 | Temp 98.0°F | Resp 20 | Ht 68.5 in | Wt 183.0 lb

## 2021-10-22 DIAGNOSIS — R161 Splenomegaly, not elsewhere classified: Secondary | ICD-10-CM | POA: Diagnosis not present

## 2021-10-22 DIAGNOSIS — K7460 Unspecified cirrhosis of liver: Secondary | ICD-10-CM | POA: Insufficient documentation

## 2021-10-22 DIAGNOSIS — Z79899 Other long term (current) drug therapy: Secondary | ICD-10-CM | POA: Diagnosis not present

## 2021-10-22 DIAGNOSIS — D696 Thrombocytopenia, unspecified: Secondary | ICD-10-CM

## 2021-10-22 DIAGNOSIS — K219 Gastro-esophageal reflux disease without esophagitis: Secondary | ICD-10-CM

## 2021-10-22 DIAGNOSIS — M7989 Other specified soft tissue disorders: Secondary | ICD-10-CM | POA: Insufficient documentation

## 2021-10-22 DIAGNOSIS — K7682 Hepatic encephalopathy: Secondary | ICD-10-CM

## 2021-10-22 DIAGNOSIS — D72819 Decreased white blood cell count, unspecified: Secondary | ICD-10-CM | POA: Diagnosis not present

## 2021-10-22 LAB — CBC WITH DIFFERENTIAL (CANCER CENTER ONLY)
Abs Immature Granulocytes: 0.01 10*3/uL (ref 0.00–0.07)
Basophils Absolute: 0 10*3/uL (ref 0.0–0.1)
Basophils Relative: 2 %
Eosinophils Absolute: 0.1 10*3/uL (ref 0.0–0.5)
Eosinophils Relative: 5 %
HCT: 30.6 % — ABNORMAL LOW (ref 39.0–52.0)
Hemoglobin: 10.3 g/dL — ABNORMAL LOW (ref 13.0–17.0)
Immature Granulocytes: 0 %
Lymphocytes Relative: 34 %
Lymphs Abs: 0.9 10*3/uL (ref 0.7–4.0)
MCH: 33.4 pg (ref 26.0–34.0)
MCHC: 33.7 g/dL (ref 30.0–36.0)
MCV: 99.4 fL (ref 80.0–100.0)
Monocytes Absolute: 0.3 10*3/uL (ref 0.1–1.0)
Monocytes Relative: 13 %
Neutro Abs: 1.2 10*3/uL — ABNORMAL LOW (ref 1.7–7.7)
Neutrophils Relative %: 46 %
Platelet Count: 61 10*3/uL — ABNORMAL LOW (ref 150–400)
RBC: 3.08 MIL/uL — ABNORMAL LOW (ref 4.22–5.81)
RDW: 15.2 % (ref 11.5–15.5)
WBC Count: 2.7 10*3/uL — ABNORMAL LOW (ref 4.0–10.5)
nRBC: 0 % (ref 0.0–0.2)

## 2021-10-22 LAB — SAVE SMEAR(SSMR), FOR PROVIDER SLIDE REVIEW

## 2021-10-22 LAB — CMP (CANCER CENTER ONLY)
ALT: 26 U/L (ref 0–44)
AST: 38 U/L (ref 15–41)
Albumin: 2.7 g/dL — ABNORMAL LOW (ref 3.5–5.0)
Alkaline Phosphatase: 84 U/L (ref 38–126)
Anion gap: 3 — ABNORMAL LOW (ref 5–15)
BUN: 8 mg/dL (ref 8–23)
CO2: 26 mmol/L (ref 22–32)
Calcium: 9.1 mg/dL (ref 8.9–10.3)
Chloride: 112 mmol/L — ABNORMAL HIGH (ref 98–111)
Creatinine: 0.78 mg/dL (ref 0.61–1.24)
GFR, Estimated: >60 mL/min (ref >60)
Glucose, Bld: 170 mg/dL — ABNORMAL HIGH (ref 70–99)
Potassium: 3.7 mmol/L (ref 3.5–5.1)
Sodium: 141 mmol/L (ref 135–145)
Total Bilirubin: 2.7 mg/dL — ABNORMAL HIGH (ref 0.3–1.2)
Total Protein: 5.4 g/dL — ABNORMAL LOW (ref 6.5–8.1)

## 2021-10-22 LAB — AMMONIA: Ammonia: 44 umol/L — ABNORMAL HIGH (ref 9–35)

## 2021-10-22 NOTE — Progress Notes (Signed)
Hematology and Oncology Follow Up Visit  Steve Watson [id number] 08/24/1940 81 y.o. 10/22/2021   Principle Diagnosis:  Leukopenia and Thrombocytopenia - cirrhosis by ultrasound imaging  Current Therapy:   Observation     Interim History:  Steve Watson is back for follow-up.  Thankfully, he is doing much better mentally.  The last time that we saw him, which I think was probably back in June, he was incredibly encephalopathic.  He had to be admitted.  He subsequently was admitted again in July with encephalopathy.  He is now on lactulose.  His ammonia today is 44.  He does seem much more clear.  He was able to answer questions.  He does have bad cirrhosis.  I really hate this for him.  He has had no problems with bleeding.  He has had no issues with diarrhea from the lactulose.  He has significant leg swelling.  I suspect he will always have this.  He has had no cough or shortness of breath.  He has had no rashes.  He has had some ecchymoses.  Currently, I would say his performance status is probably ECOG 1.       Medications:  Current Outpatient Medications:    aspirin 81 MG tablet, Take 81 mg by mouth daily., Disp: , Rfl:    Cholecalciferol 2000 UNITS TABS, Take 1 tablet (2,000 Units total) by mouth daily at 12 noon., Disp: 30 each, Rfl: 2   esomeprazole (NEXIUM) 40 MG capsule, Take 1 capsule (40 mg total) by mouth daily. Before dinner., Disp: 60 capsule, Rfl: 3   lactulose (CHRONULAC) 10 GM/15ML solution, Take 30 mLs (20 g total) by mouth 2 (two) times daily., Disp: 236 mL, Rfl: 0   midodrine (PROAMATINE) 5 MG tablet, Take 1 tablet (5 mg total) by mouth 3 (three) times daily with meals., Disp: 90 tablet, Rfl: 0   Multiple Vitamins-Minerals (VISION VITAMINS) TABS, Take 2 tablets by mouth daily., Disp: , Rfl:    spironolactone (ALDACTONE) 25 MG tablet, Take 25 mg by mouth daily., Disp: , Rfl:    sucralfate (CARAFATE) 1 GM/10ML suspension, Take 10 mLs (1 g total) by mouth 4  (four) times daily., Disp: 420 mL, Rfl: 2   torsemide (DEMADEX) 20 MG tablet, Take 1 tablet by mouth once daily, Disp: 30 tablet, Rfl: 0   Zinc 100 MG TABS, Take 100 mg by mouth daily., Disp: , Rfl:   Allergies: No Known Allergies  Past Medical History, Surgical history, Social history, and Family History were reviewed and updated.  Review of Systems: Review of Systems  Constitutional: Negative.   HENT: Negative.    Eyes: Negative.   Respiratory: Negative.    Cardiovascular: Negative.   Gastrointestinal: Negative.   Genitourinary: Negative.   Musculoskeletal: Negative.   Skin: Negative.   Neurological: Negative.   Endo/Heme/Allergies: Negative.   Psychiatric/Behavioral: Negative.       Physical Exam:  height is 5' 8.5" (1.74 m) and weight is 183 lb (83 kg). His oral temperature is 98 F (36.7 C). His blood pressure is 135/72 and his pulse is 85. His respiration is 20 and oxygen saturation is 100%.   Wt Readings from Last 3 Encounters:  10/22/21 183 lb (83 kg)  10/13/21 173 lb 3.2 oz (78.6 kg)  09/30/21 174 lb 3.2 oz (79 kg)     Physical Exam Vitals reviewed.  HENT:     Head: Normocephalic and atraumatic.  Eyes:     Pupils: Pupils are equal, round, and reactive  to light.  Cardiovascular:     Rate and Rhythm: Normal rate and regular rhythm.     Heart sounds: Normal heart sounds.  Pulmonary:     Effort: Pulmonary effort is normal.     Breath sounds: Normal breath sounds.  Abdominal:     General: Bowel sounds are normal.     Palpations: Abdomen is soft.  Musculoskeletal:        General: No tenderness or deformity. Normal range of motion.     Cervical back: Normal range of motion.  Lymphadenopathy:     Cervical: No cervical adenopathy.  Skin:    General: Skin is warm and dry.     Findings: No erythema or rash.  Neurological:     Mental Status: He is alert and oriented to person, place, and time.  Psychiatric:        Behavior: Behavior normal.        Thought  Content: Thought content normal.        Judgment: Judgment normal.      Lab Results  Component Value Date   WBC 2.7 (L) 10/22/2021   HGB 10.3 (L) 10/22/2021   HCT 30.6 (L) 10/22/2021   MCV 99.4 10/22/2021   PLT 61 (L) 10/22/2021     Chemistry      Component Value Date/Time   NA 141 10/22/2021 0924   NA 138 07/13/2020 1322   K 3.7 10/22/2021 0924   CL 112 (H) 10/22/2021 0924   CO2 26 10/22/2021 0924   BUN 8 10/22/2021 0924   BUN 18 07/13/2020 1322   CREATININE 0.78 10/22/2021 0924      Component Value Date/Time   CALCIUM 9.1 10/22/2021 0924   ALKPHOS 84 10/22/2021 0924   AST 38 10/22/2021 0924   ALT 26 10/22/2021 0924   BILITOT 2.7 (H) 10/22/2021 0924       Impression and Plan: Steve Watson is a 81 year old white male.  We have been following him now for several years.  He has cirrhosis.  I am sure that he has some element of splenomegaly.  He had an MRI of the abdomen back 2 years ago.  This did show splenomegaly at that time.  I am sure this is probably a little more significant.  For right now, we will continue to follow him along.  I do not see that we have to do anything with respect to his white cells or with his platelets.  I think we have to follow him a little more closely.  I will like to get him back in November before the Holidays so that we can make sure that his blood counts are okay so he can enjoy the [place] season.   Volanda Napoleon, MD 9/29/202310:19 AM

## 2021-10-26 ENCOUNTER — Telehealth: Payer: Self-pay | Admitting: Medical

## 2021-10-26 NOTE — Telephone Encounter (Signed)
Medication: lactulose ([place]) 10 GM/15ML solution [[id number]   Steve Watson has never called this in, it was prescribed by provider in hospital back in June)  Has the patient contacted their pharmacy? Yes.    (If yes, when and what did the pharmacy advise?) they faxed over request for refill   Preferred Pharmacy (with phone number or street name): [place], [place] [address]  Phone:  [phone]  Fax:  [phone]    Agent: Please be advised that RX refills may take up to 3 business days. We ask that you follow-up with your pharmacy.

## 2021-10-27 MED ORDER — LACTULOSE 10 GM/15ML PO SOLN: 20.0000 g | Freq: Two times a day (BID) | ORAL | 4 refills | Status: DC

## 2021-10-27 NOTE — Telephone Encounter (Signed)
Pt.notified

## 2021-10-27 NOTE — Addendum Note (Signed)
Addended by: Anabel Halon on: 10/27/2021 12:48 PM   Modules accepted: Orders

## 2021-10-27 NOTE — Telephone Encounter (Signed)
Pt called and lvm to return call 

## 2021-12-01 ENCOUNTER — Inpatient Hospital Stay: Payer: No Typology Code available for payment source | Attending: Hematology & Oncology

## 2021-12-01 ENCOUNTER — Other Ambulatory Visit: Payer: Self-pay

## 2021-12-01 ENCOUNTER — Encounter: Payer: Self-pay | Admitting: Hematology & Oncology

## 2021-12-01 ENCOUNTER — Inpatient Hospital Stay (HOSPITAL_BASED_OUTPATIENT_CLINIC_OR_DEPARTMENT_OTHER): Payer: No Typology Code available for payment source | Admitting: Hematology & Oncology

## 2021-12-01 VITALS — Ht 68.5 in | Wt 186.0 lb

## 2021-12-01 DIAGNOSIS — D7381 Neutropenic splenomegaly: Secondary | ICD-10-CM | POA: Diagnosis not present

## 2021-12-01 DIAGNOSIS — D696 Thrombocytopenia, unspecified: Secondary | ICD-10-CM | POA: Diagnosis not present

## 2021-12-01 DIAGNOSIS — M7989 Other specified soft tissue disorders: Secondary | ICD-10-CM | POA: Insufficient documentation

## 2021-12-01 DIAGNOSIS — D72819 Decreased white blood cell count, unspecified: Secondary | ICD-10-CM | POA: Insufficient documentation

## 2021-12-01 DIAGNOSIS — K219 Gastro-esophageal reflux disease without esophagitis: Secondary | ICD-10-CM

## 2021-12-01 DIAGNOSIS — K7682 Hepatic encephalopathy: Secondary | ICD-10-CM

## 2021-12-01 DIAGNOSIS — Z79899 Other long term (current) drug therapy: Secondary | ICD-10-CM | POA: Insufficient documentation

## 2021-12-01 DIAGNOSIS — K7460 Unspecified cirrhosis of liver: Secondary | ICD-10-CM | POA: Insufficient documentation

## 2021-12-01 DIAGNOSIS — R161 Splenomegaly, not elsewhere classified: Secondary | ICD-10-CM | POA: Insufficient documentation

## 2021-12-01 LAB — CBC WITH DIFFERENTIAL (CANCER CENTER ONLY)
Abs Immature Granulocytes: 0 10*3/uL (ref 0.00–0.07)
Basophils Absolute: 0 10*3/uL (ref 0.0–0.1)
Basophils Relative: 1 %
Eosinophils Absolute: 0.1 10*3/uL (ref 0.0–0.5)
Eosinophils Relative: 5 %
HCT: 29.8 % — ABNORMAL LOW (ref 39.0–52.0)
Hemoglobin: 9.9 g/dL — ABNORMAL LOW (ref 13.0–17.0)
Immature Granulocytes: 0 %
Lymphocytes Relative: 31 %
Lymphs Abs: 0.8 10*3/uL (ref 0.7–4.0)
MCH: 33.7 pg (ref 26.0–34.0)
MCHC: 33.2 g/dL (ref 30.0–36.0)
MCV: 101.4 fL — ABNORMAL HIGH (ref 80.0–100.0)
Monocytes Absolute: 0.3 10*3/uL (ref 0.1–1.0)
Monocytes Relative: 12 %
Neutro Abs: 1.3 10*3/uL — ABNORMAL LOW (ref 1.7–7.7)
Neutrophils Relative %: 51 %
Platelet Count: 58 10*3/uL — ABNORMAL LOW (ref 150–400)
RBC: 2.94 MIL/uL — ABNORMAL LOW (ref 4.22–5.81)
RDW: 16.1 % — ABNORMAL HIGH (ref 11.5–15.5)
WBC Count: 2.5 10*3/uL — ABNORMAL LOW (ref 4.0–10.5)
nRBC: 0 % (ref 0.0–0.2)

## 2021-12-01 LAB — LACTATE DEHYDROGENASE: LDH: 210 U/L — ABNORMAL HIGH (ref 98–192)

## 2021-12-01 LAB — SAVE SMEAR(SSMR), FOR PROVIDER SLIDE REVIEW

## 2021-12-01 LAB — IRON AND IRON BINDING CAPACITY (CC-WL,HP ONLY)
Iron: 159 ug/dL (ref 45–182)
Saturation Ratios: 65 % — ABNORMAL HIGH (ref 17.9–39.5)
TIBC: 244 ug/dL — ABNORMAL LOW (ref 250–450)
UIBC: 85 ug/dL — ABNORMAL LOW (ref 117–376)

## 2021-12-01 LAB — CMP (CANCER CENTER ONLY)
ALT: 19 U/L (ref 0–44)
AST: 32 U/L (ref 15–41)
Albumin: 2.5 g/dL — ABNORMAL LOW (ref 3.5–5.0)
Alkaline Phosphatase: 83 U/L (ref 38–126)
Anion gap: 5 (ref 5–15)
BUN: 10 mg/dL (ref 8–23)
CO2: 25 mmol/L (ref 22–32)
Calcium: 8.8 mg/dL — ABNORMAL LOW (ref 8.9–10.3)
Chloride: 112 mmol/L — ABNORMAL HIGH (ref 98–111)
Creatinine: 0.69 mg/dL (ref 0.61–1.24)
GFR, Estimated: >60 mL/min (ref >60)
Glucose, Bld: 151 mg/dL — ABNORMAL HIGH (ref 70–99)
Potassium: 4 mmol/L (ref 3.5–5.1)
Sodium: 142 mmol/L (ref 135–145)
Total Bilirubin: 2.6 mg/dL — ABNORMAL HIGH (ref 0.3–1.2)
Total Protein: 5.4 g/dL — ABNORMAL LOW (ref 6.5–8.1)

## 2021-12-01 LAB — AMMONIA: Ammonia: 66 umol/L — ABNORMAL HIGH (ref 9–35)

## 2021-12-01 LAB — FERRITIN: Ferritin: 35 ng/mL (ref 24–336)

## 2021-12-01 LAB — RETICULOCYTES
Immature Retic Fract: 15.9 % (ref 2.3–15.9)
RBC.: 2.9 MIL/uL — ABNORMAL LOW (ref 4.22–5.81)
Retic Count, Absolute: 73.1 10*3/uL (ref 19.0–186.0)
Retic Ct Pct: 2.5 % (ref 0.4–3.1)

## 2021-12-01 NOTE — Progress Notes (Signed)
Hematology and Oncology Follow Up Visit  Steve Watson [id number] 1941/01/02 81 y.o. 12/01/2021   Principle Diagnosis:  Leukopenia and Thrombocytopenia - cirrhosis by ultrasound imaging  Current Therapy:   Observation     Interim History:  [name] is back for follow-up.  He seems to be doing pretty well.  His ammonia is up to 66 today.  He is on lactulose.  There has a lot of swelling in his legs.  He is on diuretics.  I am not sure who takes care of that issue for him.  He seems to be eating okay.  He has had no problems with nausea or vomiting.  There is been no bleeding.  He has had some diarrhea, probably from the lactulose that he takes.  He has had no pain.  He has had no rashes.  He has had no swollen lymph nodes.  Overall, I would have said that his performance status is probably ECOG 2.  .       Medications:  Current Outpatient Medications:    aspirin 81 MG tablet, Take 81 mg by mouth daily., Disp: , Rfl:    Cholecalciferol 2000 UNITS TABS, Take 1 tablet (2,000 Units total) by mouth daily at 12 noon., Disp: 30 each, Rfl: 2   esomeprazole (NEXIUM) 40 MG capsule, Take 1 capsule (40 mg total) by mouth daily. Before dinner., Disp: 60 capsule, Rfl: 3   lactulose (CHRONULAC) 10 GM/15ML solution, Take 30 mLs (20 g total) by mouth 2 (two) times daily., Disp: 946 mL, Rfl: 4   midodrine (PROAMATINE) 5 MG tablet, Take 1 tablet (5 mg total) by mouth 3 (three) times daily with meals., Disp: 90 tablet, Rfl: 0   Multiple Vitamins-Minerals (VISION VITAMINS) TABS, Take 2 tablets by mouth daily., Disp: , Rfl:    spironolactone (ALDACTONE) 25 MG tablet, Take 25 mg by mouth daily., Disp: , Rfl:    sucralfate (CARAFATE) 1 GM/10ML suspension, Take 10 mLs (1 g total) by mouth 4 (four) times daily., Disp: 420 mL, Rfl: 2   torsemide (DEMADEX) 20 MG tablet, Take 1 tablet by mouth once daily, Disp: 30 tablet, Rfl: 0   Zinc 100 MG TABS, Take 100 mg by mouth daily., Disp: , Rfl:   Allergies: No  Known Allergies  Past Medical History, Surgical history, Social history, and Family History were reviewed and updated.  Review of Systems: Review of Systems  Constitutional: Negative.   HENT: Negative.    Eyes: Negative.   Respiratory: Negative.    Cardiovascular: Negative.   Gastrointestinal: Negative.   Genitourinary: Negative.   Musculoskeletal: Negative.   Skin: Negative.   Neurological: Negative.   Endo/Heme/Allergies: Negative.   Psychiatric/Behavioral: Negative.       Physical Exam:  height is 5' 8.5" (1.74 m) and weight is 186 lb (84.4 kg).   Wt Readings from Last 3 Encounters:  12/01/21 186 lb (84.4 kg)  10/22/21 183 lb (83 kg)  10/13/21 173 lb 3.2 oz (78.6 kg)     Physical Exam Vitals reviewed.  HENT:     Head: Normocephalic and atraumatic.  Eyes:     Pupils: Pupils are equal, round, and reactive to light.  Cardiovascular:     Rate and Rhythm: Normal rate and regular rhythm.     Heart sounds: Normal heart sounds.  Pulmonary:     Effort: Pulmonary effort is normal.     Breath sounds: Normal breath sounds.  Abdominal:     General: Bowel sounds are normal.  Palpations: Abdomen is soft.     Comments: Abdominal exam is soft.  Bowel sounds are present.  There is no fluid wave.  There is no guarding or rebound tenderness.  His spleen tip is just below the left costal margin.  I cannot palpate his liver edge.  Musculoskeletal:        General: No tenderness or deformity. Normal range of motion.     Cervical back: Normal range of motion.     Comments: Extremities shows about 3+ edema bilaterally in his lower legs.  This is pitting edema.  Lymphadenopathy:     Cervical: No cervical adenopathy.  Skin:    General: Skin is warm and dry.     Findings: No erythema or rash.  Neurological:     Mental Status: He is alert and oriented to person, place, and time.  Psychiatric:        Behavior: Behavior normal.        Thought Content: Thought content normal.         Judgment: Judgment normal.      Lab Results  Component Value Date   WBC 2.5 (L) 12/01/2021   HGB 9.9 (L) 12/01/2021   HCT 29.8 (L) 12/01/2021   MCV 101.4 (H) 12/01/2021   PLT 58 (L) 12/01/2021     Chemistry      Component Value Date/Time   NA 142 12/01/2021 0855   NA 138 07/13/2020 1322   K 4.0 12/01/2021 0855   CL 112 (H) 12/01/2021 0855   CO2 25 12/01/2021 0855   BUN 10 12/01/2021 0855   BUN 18 07/13/2020 1322   CREATININE 0.69 12/01/2021 0855      Component Value Date/Time   CALCIUM 8.8 (L) 12/01/2021 0855   ALKPHOS 83 12/01/2021 0855   AST 32 12/01/2021 0855   ALT 19 12/01/2021 0855   BILITOT 2.6 (H) 12/01/2021 0855       Impression and Plan: [name] is a 81 year old white male.  We have been following him now for several years.  He has cirrhosis.  I am sure that he has some element of splenomegaly.  He had an MRI of the abdomen back 2 years ago.  This did show splenomegaly at that time.  I am sure this is probably a little more significant.  We probably do need to repeat an ultrasound of his spleen to see how big it is.  At this point, we will plan to get him back after the holiday season.  I think he is okay to get through the holiday season.  His blood counts are stable.  His prognosis clearly is tied to his cirrhosis.  He has a lot of swelling in his legs.  Hopefully, his doctor will be able to get him on a diuretic regimen that will help his some of the fluid out.    Volanda Napoleon, MD 11/8/20239:48 AM

## 2021-12-02 MED ORDER — TORSEMIDE 20 MG PO TABS: 20.0000 mg | ORAL_TABLET | Freq: Every day | ORAL | 0 refills | Status: DC

## 2021-12-02 MED ORDER — SPIRONOLACTONE 25 MG PO TABS: 25.0000 mg | ORAL_TABLET | Freq: Every day | ORAL | 0 refills | Status: DC

## 2021-12-02 NOTE — Addendum Note (Signed)
Addended by: Anabel Halon on: 12/02/2021 09:24 PM   Modules accepted: Orders

## 2021-12-06 ENCOUNTER — Telehealth: Payer: Self-pay | Admitting: Hematology & Oncology

## 2021-12-06 NOTE — Telephone Encounter (Signed)
Contacted pt to schedule appts per 12/01/21 LOS. Unable to reach via phone, voicemail was left.

## 2021-12-09 DIAGNOSIS — E663 Overweight: Secondary | ICD-10-CM | POA: Diagnosis not present

## 2021-12-09 DIAGNOSIS — K7460 Unspecified cirrhosis of liver: Secondary | ICD-10-CM | POA: Diagnosis not present

## 2021-12-09 DIAGNOSIS — Z008 Encounter for other general examination: Secondary | ICD-10-CM | POA: Diagnosis not present

## 2021-12-09 DIAGNOSIS — I700 Atherosclerosis of aorta: Secondary | ICD-10-CM | POA: Diagnosis not present

## 2021-12-09 DIAGNOSIS — Z6827 Body mass index (BMI) 27.0-27.9, adult: Secondary | ICD-10-CM | POA: Diagnosis not present

## 2021-12-13 ENCOUNTER — Other Ambulatory Visit: Payer: Self-pay | Admitting: Medical

## 2021-12-22 ENCOUNTER — Telehealth: Payer: Self-pay

## 2021-12-22 ENCOUNTER — Ambulatory Visit (HOSPITAL_BASED_OUTPATIENT_CLINIC_OR_DEPARTMENT_OTHER)
Admission: RE | Admit: 2021-12-22 | Discharge: 2021-12-22 | Disposition: A | Discharge status: Home / Self Care | Payer: No Typology Code available for payment source | Source: Ambulatory Visit | Attending: Hematology & Oncology | Admitting: Hematology & Oncology

## 2021-12-22 DIAGNOSIS — K7682 Hepatic encephalopathy: Secondary | ICD-10-CM | POA: Diagnosis not present

## 2021-12-22 DIAGNOSIS — R531 Weakness: Secondary | ICD-10-CM | POA: Diagnosis not present

## 2021-12-22 DIAGNOSIS — E889 Metabolic disorder, unspecified: Secondary | ICD-10-CM | POA: Diagnosis not present

## 2021-12-22 DIAGNOSIS — K7581 Nonalcoholic steatohepatitis (NASH): Secondary | ICD-10-CM | POA: Diagnosis not present

## 2021-12-22 DIAGNOSIS — K7460 Unspecified cirrhosis of liver: Secondary | ICD-10-CM | POA: Diagnosis not present

## 2021-12-22 DIAGNOSIS — R410 Disorientation, unspecified: Secondary | ICD-10-CM | POA: Diagnosis not present

## 2021-12-22 DIAGNOSIS — E872 Acidosis, unspecified: Secondary | ICD-10-CM | POA: Diagnosis not present

## 2021-12-22 DIAGNOSIS — Z8719 Personal history of other diseases of the digestive system: Secondary | ICD-10-CM | POA: Diagnosis not present

## 2021-12-22 DIAGNOSIS — K219 Gastro-esophageal reflux disease without esophagitis: Secondary | ICD-10-CM | POA: Diagnosis not present

## 2021-12-22 DIAGNOSIS — W19XXXA Unspecified fall, initial encounter: Secondary | ICD-10-CM | POA: Diagnosis not present

## 2021-12-22 DIAGNOSIS — R011 Cardiac murmur, unspecified: Secondary | ICD-10-CM | POA: Diagnosis not present

## 2021-12-22 DIAGNOSIS — D7381 Neutropenic splenomegaly: Secondary | ICD-10-CM | POA: Insufficient documentation

## 2021-12-22 DIAGNOSIS — R001 Bradycardia, unspecified: Secondary | ICD-10-CM | POA: Diagnosis not present

## 2021-12-22 DIAGNOSIS — K7469 Other cirrhosis of liver: Secondary | ICD-10-CM | POA: Diagnosis not present

## 2021-12-22 DIAGNOSIS — R000 Tachycardia, unspecified: Secondary | ICD-10-CM | POA: Diagnosis not present

## 2021-12-22 DIAGNOSIS — E7220 Disorder of urea cycle metabolism, unspecified: Secondary | ICD-10-CM | POA: Diagnosis not present

## 2021-12-22 DIAGNOSIS — I959 Hypotension, unspecified: Secondary | ICD-10-CM | POA: Diagnosis not present

## 2021-12-22 NOTE — Telephone Encounter (Signed)
Nurse Assessment Nurse: Nicki Reaper, RN, Malachy Mood Date/Time [name] Time): 12/22/2021 4:20:24 PM Confirm and document reason for call. If symptomatic, describe symptoms. ---Caller states her husband fell outside today in neighbors yard, she got his couch and up again and fell, denies hitting head, HX of dementia, is confused now more than normal, when he speaks wife is unable to under stand him and this is new, he is unable to get up on his own, Does the patient have any new or worsening symptoms? ---Yes Will a triage be completed? ---Yes Related visit to physician within the last 2 weeks? ---No Does the PT have any chronic conditions? (i.e. diabetes, asthma, this includes High risk factors for pregnancy, etc.) ---Yes List chronic conditions. ---Cirrhosis liver Is this a behavioral health or substance abuse call? ---No Guidelines Guideline Title Affirmed Question Affirmed Notes Nurse Date/Time (Eastern Time) Falls and Falling Difficult to awaken or acting confused (e.g., disoriented, slurred speech) Nicki Reaper, RN, Malachy Mood 12/22/2021 4:26:20 PM PLEASE NOTE: All timestamps contained within this report are represented as [place] Standard Time. CONFIDENTIALTY NOTICE: This fax transmission is intended only for the addressee. It contains information that is legally privileged, confidential or otherwise protected from use or disclosure. If you are not the intended recipient, you are strictly prohibited from reviewing, disclosing, copying using or disseminating any of this information or taking any action in reliance on or regarding this information. If you have received this fax in error, please notify us immediately by telephone so that we can arrange for its return to [place]. Phone: [phone], Toll-Free: [phone], Fax: [phone] Page: 2 of 2 Call Id: [id number] [place]. Time [name] Time) Disposition Final User 12/22/2021 4:23:44 PM Send To RN Personal Nicki Reaper, RN, Cheryl 12/22/2021 4:27:00 PM  Call EMS 911 Now Yes Nicki Reaper, RN, Malachy Mood 12/22/2021 4:30:28 PM 911 Outcome Documentation Nicki Reaper, RN, Malachy Mood Reason: received voice mail Final Disposition 12/22/2021 4:27:00 PM Call EMS 911 Now Yes Nicki Reaper, RN, Erskine Speed Disagree/Comply Comply Caller Understands Yes PreDisposition Call Doctor Care Advice Given Per Guideline CALL EMS 911 NOW: * Immediate medical attention is needed. You need to hang up and call 911 (or an ambulance). * Triager Discretion: I'll call you back in a few minutes to be sure you were able to reach them. CARE ADVICE given per Fall and Falling (Adult) guideline

## 2021-12-22 NOTE — Telephone Encounter (Signed)
Called pt and wife stated they were in the ambulance headed to hospital , PCP notified

## 2021-12-23 ENCOUNTER — Telehealth: Payer: Self-pay | Admitting: *Deleted

## 2021-12-23 DIAGNOSIS — K7682 Hepatic encephalopathy: Secondary | ICD-10-CM | POA: Diagnosis not present

## 2021-12-23 DIAGNOSIS — I517 Cardiomegaly: Secondary | ICD-10-CM | POA: Diagnosis not present

## 2021-12-23 DIAGNOSIS — N390 Urinary tract infection, site not specified: Secondary | ICD-10-CM | POA: Diagnosis not present

## 2021-12-23 DIAGNOSIS — K7469 Other cirrhosis of liver: Secondary | ICD-10-CM | POA: Diagnosis not present

## 2021-12-23 DIAGNOSIS — I350 Nonrheumatic aortic (valve) stenosis: Secondary | ICD-10-CM | POA: Diagnosis not present

## 2021-12-23 DIAGNOSIS — R0602 Shortness of breath: Secondary | ICD-10-CM | POA: Diagnosis not present

## 2021-12-23 DIAGNOSIS — R011 Cardiac murmur, unspecified: Secondary | ICD-10-CM | POA: Diagnosis not present

## 2021-12-23 DIAGNOSIS — I959 Hypotension, unspecified: Secondary | ICD-10-CM | POA: Diagnosis not present

## 2021-12-23 DIAGNOSIS — K219 Gastro-esophageal reflux disease without esophagitis: Secondary | ICD-10-CM | POA: Diagnosis not present

## 2021-12-23 NOTE — Telephone Encounter (Signed)
-----   Message from Volanda Napoleon, MD sent at 12/22/2021  5:13 PM EST ----- Call and let him know that the ultrasound looks okay.  His spleen is a little bit enlarged but not too bad.  He does have cirrhosis but there is no obvious cancer in the liver.  Thanks.  Laurey Arrow

## 2021-12-23 NOTE — Telephone Encounter (Signed)
Unable to reach pt, lmovm with results.

## 2021-12-23 NOTE — Telephone Encounter (Signed)
Pt admitted

## 2021-12-24 DIAGNOSIS — I959 Hypotension, unspecified: Secondary | ICD-10-CM | POA: Diagnosis not present

## 2021-12-24 DIAGNOSIS — N390 Urinary tract infection, site not specified: Secondary | ICD-10-CM | POA: Diagnosis not present

## 2021-12-24 DIAGNOSIS — E872 Acidosis, unspecified: Secondary | ICD-10-CM | POA: Diagnosis not present

## 2021-12-24 DIAGNOSIS — K219 Gastro-esophageal reflux disease without esophagitis: Secondary | ICD-10-CM | POA: Diagnosis not present

## 2021-12-24 DIAGNOSIS — E7220 Disorder of urea cycle metabolism, unspecified: Secondary | ICD-10-CM | POA: Diagnosis not present

## 2021-12-24 DIAGNOSIS — R011 Cardiac murmur, unspecified: Secondary | ICD-10-CM | POA: Diagnosis not present

## 2021-12-24 DIAGNOSIS — R410 Disorientation, unspecified: Secondary | ICD-10-CM | POA: Diagnosis not present

## 2021-12-24 DIAGNOSIS — K7682 Hepatic encephalopathy: Secondary | ICD-10-CM | POA: Diagnosis not present

## 2021-12-24 DIAGNOSIS — K7469 Other cirrhosis of liver: Secondary | ICD-10-CM | POA: Diagnosis not present

## 2021-12-27 ENCOUNTER — Ambulatory Visit (HOSPITAL_BASED_OUTPATIENT_CLINIC_OR_DEPARTMENT_OTHER): Payer: No Typology Code available for payment source

## 2021-12-31 ENCOUNTER — Ambulatory Visit (INDEPENDENT_AMBULATORY_CARE_PROVIDER_SITE_OTHER): Payer: No Typology Code available for payment source | Admitting: Medical

## 2021-12-31 VITALS — BP 130/66 | HR 88 | Temp 97.9°F | Resp 18 | Ht 68.5 in | Wt 168.8 lb

## 2021-12-31 DIAGNOSIS — D649 Anemia, unspecified: Secondary | ICD-10-CM

## 2021-12-31 DIAGNOSIS — K7682 Hepatic encephalopathy: Secondary | ICD-10-CM

## 2021-12-31 DIAGNOSIS — K7460 Unspecified cirrhosis of liver: Secondary | ICD-10-CM

## 2021-12-31 DIAGNOSIS — K7469 Other cirrhosis of liver: Secondary | ICD-10-CM

## 2021-12-31 DIAGNOSIS — I8510 Secondary esophageal varices without bleeding: Secondary | ICD-10-CM | POA: Diagnosis not present

## 2021-12-31 LAB — CBC WITH DIFFERENTIAL/PLATELET
Basophils Absolute: 0.1 10*3/uL (ref 0.0–0.1)
Basophils Relative: 2.2 % (ref 0.0–3.0)
Eosinophils Absolute: 0 10*3/uL (ref 0.0–0.7)
Eosinophils Relative: 2 % (ref 0.0–5.0)
HCT: 30.6 % — ABNORMAL LOW (ref 39.0–52.0)
Hemoglobin: 10.6 g/dL — ABNORMAL LOW (ref 13.0–17.0)
Lymphocytes Relative: 23.9 % (ref 12.0–46.0)
Lymphs Abs: 0.6 10*3/uL — ABNORMAL LOW (ref 0.7–4.0)
MCHC: 34.6 g/dL (ref 30.0–36.0)
MCV: 101.1 fl — ABNORMAL HIGH (ref 78.0–100.0)
Monocytes Absolute: 0.4 10*3/uL (ref 0.1–1.0)
Monocytes Relative: 18 % — ABNORMAL HIGH (ref 3.0–12.0)
Neutro Abs: 1.3 10*3/uL — ABNORMAL LOW (ref 1.4–7.7)
Neutrophils Relative %: 53.9 % (ref 43.0–77.0)
Platelets: 67 10*3/uL — ABNORMAL LOW (ref 150.0–400.0)
RBC: 3.03 Mil/uL — ABNORMAL LOW (ref 4.22–5.81)
RDW: 16 % — ABNORMAL HIGH (ref 11.5–15.5)
WBC: 2.5 10*3/uL — ABNORMAL LOW (ref 4.0–10.5)

## 2021-12-31 LAB — COMPREHENSIVE METABOLIC PANEL
ALT: 35 U/L (ref 0–53)
AST: 51 U/L — ABNORMAL HIGH (ref 0–37)
Albumin: 2.7 g/dL — ABNORMAL LOW (ref 3.5–5.2)
Alkaline Phosphatase: 106 U/L (ref 39–117)
BUN: 15 mg/dL (ref 6–23)
CO2: 32 mEq/L (ref 19–32)
Calcium: 8.9 mg/dL (ref 8.4–10.5)
Chloride: 104 mEq/L (ref 96–112)
Creatinine, Ser: 1.04 mg/dL (ref 0.40–1.50)
GFR: 68.17 mL/min (ref >60.00)
Glucose, Bld: 119 mg/dL — ABNORMAL HIGH (ref 70–99)
Potassium: 3.5 mEq/L (ref 3.5–5.1)
Sodium: 143 mEq/L (ref 135–145)
Total Bilirubin: 2.8 mg/dL — ABNORMAL HIGH (ref 0.2–1.2)
Total Protein: 5.6 g/dL — ABNORMAL LOW (ref 6.0–8.3)

## 2021-12-31 NOTE — Patient Instructions (Addendum)
Post hospitalization for acute confusion and mental status changes due to hepatic encephalopathy. Appears likely was not taking lactulose. Now back to baseline and state feeling well since DC from hospital.   Pt has  cirrhosis, anemia, low platelets, encephalapathy, hypotension(controlled)and hx duodenal ulcer. Conditions appear stable clinically.    Will get cbc and cmp post hospitalization.    Continue meds below.  Please make sure taking the lactulose. spironolactone 25 MG tablet Commonly known as: ALDACTONE What changed: Another medication with the same name was removed. Continue taking this medication, and follow the directions you see here. Dose: 25 mg Instructions: Take 1 tablet (25 mg total) by mouth daily.   CONTINUE taking these medications aspirin 81 MG EC tablet *ANTIPLATELET* Dose: 81 mg Instructions: Take 1 tablet (81 mg total) by mouth every evening.  esomeprazole 40 MG capsule Commonly known as: NexIUM Dose: 40 mg Instructions: Take 1 capsule (40 mg total) by mouth daily with dinner.  lactulose 10 gram/15 mL solution Commonly known as: CEPHULAC Dose: 20 g Instructions: Take 30 mLs (20 g total) by mouth 2 times daily.  midodrine 5 MG tablet Commonly known as: PROAMATINE Dose: 1 tablet Instructions: Take 1 tablet (5 mg total) by mouth 3 times daily with meals.  PreserVision AREDS-2 250-90-40-1 mg Cap Dose: 1 capsule Instructions: Take 1 capsule by mouth 2 times daily. Generic drug: vit C,E-Zn-coppr-lutein-zeaxan  rifAXIMin 550 mg Tab tablet Commonly known as: XIFAXAN Dose: 550 mg Instructions: Take 1 tablet (550 mg total) by mouth 2 times daily.  torsemide 20 MG tablet Commonly known as: DEMADEX Dose: 20 mg Instructions: Take 1 tablet (20 mg total) by mouth daily for 30 days.   STOP taking these medications pantoprazole 40 MG tablet Commonly known as: PROTONIX  Follow up 4-6 weeks with myself or sooner if needed. Please keep follow up with GI MD. I  will place referral today as well.

## 2021-12-31 NOTE — Progress Notes (Signed)
Subjective:    Patient ID: Steve Watson, male    DOB: 1940/05/22, 81 y.o.   MRN: [id number]  HPI  Pt in for follow up from hospital.   Admit date: 12/22/2021 Discharge date and time: 12/25/21  Admitting Physician: Izell Berwind, MD Discharge Physician: Penni Bombard*    Admission Diagnoses:  Hepatic encephalopathy ([place]) [K76.82] Lactic acidosis [E87.20] Confusion [X79.3] Systolic murmur [J03.0]   Discharge Diagnoses:  Hepatic encephalopathy ([place]) [K76.82] Lactic acidosis [E87.20] Confusion [S92.3] Systolic murmur [R00.7]   Admission Condition: poor Discharged Condition: fair    Hospital Course:  For full details, please see H&P, progress notes, consult notes and ancillary notes. Briefly, Steve Watson is a 81 y.o. male with a history of [name] cirrhosis, esophageal varices, T2DM who presented with altered mental status and lethargy for several days associated reportedly mechanical fall to have concern for acute metabolic encephalopathy. The patient's hospital course will be summarized in a problem based approach below.  Acute metabolic encephalopathy Patient presented to the ED with concerns for altered mental status and lethargy for several days associated reportedly mechanical fall. Per wife, patient had been gradually worsening as of recent being forgetful and slow to react, and she is unsure that the patient is currently taking his medication as prescribed due to prior complaints that the medication does not taste good. On admission, patient was found to have elevated ammonia. Normal TSH, normal B12. Patient was treated with increasing dose of lactulose with significant improvement and returned to baseline. Lactulose was titrated down to decrease BM to goal. Wife was at baseline and confirmed that.  Fall likely 2/2 orthostasis Patient was found to be orthostatic when evaluated. Patient currently on midodrine presumably for  orthostasis.  History of esophageal varices Patient has reported history of esophageal varices. It was noted that patient was not on a beta-blocker with reported history of esophageal varices. Per chart review, patient is currently taking midodrine; this could be the rational of not having been initiated on a beta-blocker.  On day of discharge, patient is clinically stable with no new examination findings or acute symptoms compared to prior. The patient was seen by the attending physician on the date of discharge and deemed stable and acceptable for discharge. The patient's chronic medical conditions were treated accordingly per the patient's home medication regimen. The patient's medication reconciliation [with changes made to chronic medications], follow-up appointments, discharge orders, instructions and significant lab and diagnostic studies are as noted.   Discharge Follow-up Action Items: 1. Follow up with PCP in 1-2 weeks. 1. No change in regimen 2. Follow up primary hepatologist    Pt wife states she is not sure whether pt was taking his lactulose. She states when taken in hospital he seemed to have improved mental status. See above under acute metabolic encephalopathy.  Pt not having light headed or dizziness on standing.         Review of Systems  Constitutional:  Negative for chills and fatigue.  HENT:  Negative for congestion, drooling, ear pain and facial swelling.   Respiratory:  Negative for cough, chest tightness, shortness of breath and wheezing.   Cardiovascular:  Negative for chest pain and palpitations.  Gastrointestinal:  Negative for abdominal pain, blood in stool and diarrhea.  Genitourinary:  Negative for dysuria, flank pain and frequency.  Musculoskeletal:  Negative for back pain and neck pain.  Skin:  Negative for rash.  Neurological:  Negative for syncope, facial asymmetry, weakness and numbness.  Hematological:  Does not bruise/bleed easily.   Psychiatric/Behavioral:  Negative for behavioral problems, decreased concentration, dysphoric mood and sleep disturbance. The patient is not nervous/anxious.     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     Social History   Socioeconomic History   Marital status: Married    Spouse name: Not on file   Number of children: Not on file   Years of education: Not on file   Highest education level: Not on file  Occupational History   Not on file  Tobacco Use   Smoking status: Never   Smokeless tobacco: Never   Tobacco comments:    NEVER USED TOBACCO  Vaping Use   Vaping Use: Never used  Substance and Sexual Activity   Alcohol use: No   Drug use: No   Sexual activity: Yes  Other Topics Concern   Not on file  Social History Narrative   2 Sons; grandson works in Recruitment consultant in [employer] for 8 years    Enjoys weightlifting    Social Determinants of Health   Financial Resource Strain: Low Risk  (02/24/2020)   Overall Financial Resource Strain (CARDIA)    Difficulty of Paying Living Expenses: Not hard at all  Food Insecurity: No Food Insecurity (02/24/2020)   Hunger Vital Sign    Worried About Running Out of Food in the Last Year: Never true    [place] in the Last Year: Never true  Transportation Needs: No Transportation Needs (02/24/2020)   PRAPARE - Hydrologist (Medical): No    Lack of Transportation (Non-Medical): No  Physical Activity: Sufficiently Active (02/24/2020)   Exercise Vital Sign    Days of Exercise per Week: 3 days    Minutes of Exercise per Session: 50 min  Stress: No Stress Concern Present (02/24/2020)   [place]    Feeling of Stress : Not at all  Social Connections: Moderately Isolated (02/24/2020)   Social Connection and  Isolation Panel [NHANES]    Frequency of Communication with Friends and Family: More than three times a week    Frequency of Social Gatherings with Friends and Family: More than three times a week    Attends Religious Services: Never    Marine scientist or Organizations: No    Attends Archivist Meetings: Never    Marital Status: Married  Human resources officer Violence: Not At Risk (02/24/2020)   Humiliation, Afraid, Rape, and Kick questionnaire    Fear of Current or Ex-Partner: No    Emotionally Abused: No    Physically Abused: No    Sexually Abused: No    Past Surgical History:  Procedure Laterality Date   BIOPSY  06/27/2021   Procedure: BIOPSY;  Surgeon: Lavena Bullion, DO;  Location: [facility];  Service: Gastroenterology;;   ESOPHAGOGASTRODUODENOSCOPY (EGD) WITH PROPOFOL N/A 06/27/2021   Procedure: ESOPHAGOGASTRODUODENOSCOPY (EGD) WITH PROPOFOL;  Surgeon: Lavena Bullion, DO;  Location: [facility];  Service: Gastroenterology;  Laterality: N/A;   NO PAST SURGERIES      Family History  Problem Relation Age of Onset   Hypertension Mother    Hypertension Father    Hypertension Brother    Diabetes Brother    Healthy Child    Healthy Grandchild     No Known  Allergies  Current Outpatient Medications on File Prior to Visit  Medication Sig Dispense Refill   aspirin 81 MG tablet Take 81 mg by mouth daily.     Cholecalciferol 2000 UNITS TABS Take 1 tablet (2,000 Units total) by mouth daily at 12 noon. 30 each 2   esomeprazole (NEXIUM) 40 MG capsule Take 1 capsule (40 mg total) by mouth daily. Before dinner. 60 capsule 3   lactulose (CHRONULAC) 10 GM/15ML solution Take 30 mLs (20 g total) by mouth 2 (two) times daily. 946 mL 4   midodrine (PROAMATINE) 5 MG tablet TAKE 1 TABLET BY MOUTH THREE TIMES DAILY WITH MEALS 90 tablet 0   Multiple Vitamins-Minerals (VISION VITAMINS) TABS Take 2 tablets by mouth daily.     spironolactone (ALDACTONE) 25 MG tablet Take 25 mg  by mouth daily.     spironolactone (ALDACTONE) 25 MG tablet Take 1 tablet (25 mg total) by mouth daily. 90 tablet 0   sucralfate (CARAFATE) 1 GM/10ML suspension Take 10 mLs (1 g total) by mouth 4 (four) times daily. 420 mL 2   torsemide (DEMADEX) 20 MG tablet Take 1 tablet by mouth once daily 30 tablet 0   torsemide (DEMADEX) 20 MG tablet Take 1 tablet (20 mg total) by mouth daily. 90 tablet 0   Zinc 100 MG TABS Take 100 mg by mouth daily.     No current facility-administered medications on file prior to visit.    BP 130/66   Pulse 88   Temp 97.9 F (36.6 C)   Resp 18   Ht 5' 8.5" (1.74 m)   Wt 168 lb 12.8 oz (76.6 kg)   SpO2 100%   BMI 25.29 kg/m        Objective:   Physical Exam   General Mental Status- Alert. General Appearance- Not in acute distress.   Skin General: Color- Normal Color. Moisture- Normal Moisture.  Neck Carotid Arteries- Normal color. Moisture- Normal Moisture. No carotid bruits. No JVD.  Chest and Lung Exam Auscultation: Breath Sounds:-Normal.  Cardiovascular Auscultation:Rythm- Regular. Murmurs & Other Heart Sounds:Auscultation of the heart reveals- No Murmurs.  Abdomen Inspection:-Inspeection Normal. Palpation/Percussion:Note:No mass. Palpation and Percussion of the abdomen reveal- Non Tender, Non Distended + BS, no rebound or guarding.  Neurologic Cranial Nerve exam:- CN III-XII intact(No nystagmus), symmetric smile. Strength:- 5/5 equal and symmetric strength both upper and lower extremities.      Assessment & Plan:   Patient Instructions  Post hospitalization for acute confusion and mental status changes due to hepatic encephalopathy. Appears likely was not taking lactulose. Now back to baseline and state feeling well since DC from hospital.   Pt has  cirrhosis, anemia, low platelets, encephalapathy, hypotension(controlled)and hx duodenal ulcer. Conditions appear stable clinically.    Will get cbc and cmp post hospitalization.     Continue meds below.  Please make sure taking the lactulose. spironolactone 25 MG tablet Commonly known as: ALDACTONE What changed: Another medication with the same name was removed. Continue taking this medication, and follow the directions you see here. Dose: 25 mg Instructions: Take 1 tablet (25 mg total) by mouth daily.   CONTINUE taking these medications aspirin 81 MG EC tablet *ANTIPLATELET* Dose: 81 mg Instructions: Take 1 tablet (81 mg total) by mouth every evening.  esomeprazole 40 MG capsule Commonly known as: NexIUM Dose: 40 mg Instructions: Take 1 capsule (40 mg total) by mouth daily with dinner.  lactulose 10 gram/15 mL solution Commonly known as: CEPHULAC Dose: 20 g Instructions: Take  30 mLs (20 g total) by mouth 2 times daily.  midodrine 5 MG tablet Commonly known as: PROAMATINE Dose: 1 tablet Instructions: Take 1 tablet (5 mg total) by mouth 3 times daily with meals.  PreserVision AREDS-2 250-90-40-1 mg Cap Dose: 1 capsule Instructions: Take 1 capsule by mouth 2 times daily. Generic drug: vit C,E-Zn-coppr-lutein-zeaxan  rifAXIMin 550 mg Tab tablet Commonly known as: XIFAXAN Dose: 550 mg Instructions: Take 1 tablet (550 mg total) by mouth 2 times daily.  torsemide 20 MG tablet Commonly known as: DEMADEX Dose: 20 mg Instructions: Take 1 tablet (20 mg total) by mouth daily for 30 days.   STOP taking these medications pantoprazole 40 MG tablet Commonly known as: PROTONIX  Follow up 4-6 weeks with myself or sooner if needed.    Mackie Pai, PA-C

## 2022-01-23 DIAGNOSIS — R609 Edema, unspecified: Secondary | ICD-10-CM | POA: Diagnosis not present

## 2022-01-23 DIAGNOSIS — K7581 Nonalcoholic steatohepatitis (NASH): Secondary | ICD-10-CM | POA: Diagnosis not present

## 2022-01-23 DIAGNOSIS — W19XXXA Unspecified fall, initial encounter: Secondary | ICD-10-CM | POA: Diagnosis not present

## 2022-01-23 DIAGNOSIS — R1900 Intra-abdominal and pelvic swelling, mass and lump, unspecified site: Secondary | ICD-10-CM | POA: Diagnosis not present

## 2022-01-23 DIAGNOSIS — N390 Urinary tract infection, site not specified: Secondary | ICD-10-CM | POA: Diagnosis not present

## 2022-01-23 DIAGNOSIS — R000 Tachycardia, unspecified: Secondary | ICD-10-CM | POA: Diagnosis not present

## 2022-01-23 DIAGNOSIS — E7220 Disorder of urea cycle metabolism, unspecified: Secondary | ICD-10-CM | POA: Diagnosis not present

## 2022-01-23 DIAGNOSIS — K7682 Hepatic encephalopathy: Secondary | ICD-10-CM | POA: Diagnosis not present

## 2022-01-24 DIAGNOSIS — Z7982 Long term (current) use of aspirin: Secondary | ICD-10-CM | POA: Diagnosis not present

## 2022-01-24 DIAGNOSIS — I639 Cerebral infarction, unspecified: Secondary | ICD-10-CM | POA: Diagnosis not present

## 2022-01-24 DIAGNOSIS — D696 Thrombocytopenia, unspecified: Secondary | ICD-10-CM | POA: Diagnosis not present

## 2022-01-24 DIAGNOSIS — R188 Other ascites: Secondary | ICD-10-CM | POA: Diagnosis not present

## 2022-01-24 DIAGNOSIS — I6789 Other cerebrovascular disease: Secondary | ICD-10-CM | POA: Diagnosis not present

## 2022-01-24 DIAGNOSIS — R296 Repeated falls: Secondary | ICD-10-CM | POA: Diagnosis not present

## 2022-01-24 DIAGNOSIS — K7581 Nonalcoholic steatohepatitis (NASH): Secondary | ICD-10-CM | POA: Diagnosis not present

## 2022-01-24 DIAGNOSIS — Z66 Do not resuscitate: Secondary | ICD-10-CM | POA: Diagnosis not present

## 2022-01-24 DIAGNOSIS — B9620 Unspecified Escherichia coli [E. coli] as the cause of diseases classified elsewhere: Secondary | ICD-10-CM | POA: Diagnosis not present

## 2022-01-24 DIAGNOSIS — D6959 Other secondary thrombocytopenia: Secondary | ICD-10-CM | POA: Diagnosis not present

## 2022-01-24 DIAGNOSIS — I4891 Unspecified atrial fibrillation: Secondary | ICD-10-CM | POA: Diagnosis not present

## 2022-01-24 DIAGNOSIS — E7220 Disorder of urea cycle metabolism, unspecified: Secondary | ICD-10-CM | POA: Diagnosis not present

## 2022-01-24 DIAGNOSIS — R000 Tachycardia, unspecified: Secondary | ICD-10-CM | POA: Diagnosis not present

## 2022-01-24 DIAGNOSIS — K7682 Hepatic encephalopathy: Secondary | ICD-10-CM | POA: Diagnosis not present

## 2022-01-24 DIAGNOSIS — R531 Weakness: Secondary | ICD-10-CM | POA: Diagnosis not present

## 2022-01-24 DIAGNOSIS — K7460 Unspecified cirrhosis of liver: Secondary | ICD-10-CM | POA: Diagnosis not present

## 2022-01-24 DIAGNOSIS — Z8719 Personal history of other diseases of the digestive system: Secondary | ICD-10-CM | POA: Diagnosis not present

## 2022-01-24 DIAGNOSIS — D539 Nutritional anemia, unspecified: Secondary | ICD-10-CM | POA: Diagnosis not present

## 2022-01-24 DIAGNOSIS — I672 Cerebral atherosclerosis: Secondary | ICD-10-CM | POA: Diagnosis not present

## 2022-01-24 DIAGNOSIS — R601 Generalized edema: Secondary | ICD-10-CM | POA: Diagnosis not present

## 2022-01-24 DIAGNOSIS — E8809 Other disorders of plasma-protein metabolism, not elsewhere classified: Secondary | ICD-10-CM | POA: Diagnosis not present

## 2022-01-24 DIAGNOSIS — N390 Urinary tract infection, site not specified: Secondary | ICD-10-CM | POA: Diagnosis not present

## 2022-01-24 DIAGNOSIS — K5781 Diverticulitis of intestine, part unspecified, with perforation and abscess with bleeding: Secondary | ICD-10-CM | POA: Diagnosis not present

## 2022-01-24 DIAGNOSIS — K7469 Other cirrhosis of liver: Secondary | ICD-10-CM | POA: Diagnosis not present

## 2022-01-24 DIAGNOSIS — E119 Type 2 diabetes mellitus without complications: Secondary | ICD-10-CM | POA: Diagnosis not present

## 2022-01-24 DIAGNOSIS — J019 Acute sinusitis, unspecified: Secondary | ICD-10-CM | POA: Diagnosis not present

## 2022-01-24 DIAGNOSIS — Z79899 Other long term (current) drug therapy: Secondary | ICD-10-CM | POA: Diagnosis not present

## 2022-01-25 ENCOUNTER — Telehealth: Payer: Self-pay

## 2022-01-25 DIAGNOSIS — K7581 Nonalcoholic steatohepatitis (NASH): Secondary | ICD-10-CM | POA: Diagnosis not present

## 2022-01-25 DIAGNOSIS — E119 Type 2 diabetes mellitus without complications: Secondary | ICD-10-CM | POA: Diagnosis not present

## 2022-01-25 DIAGNOSIS — K7682 Hepatic encephalopathy: Secondary | ICD-10-CM | POA: Diagnosis not present

## 2022-01-25 DIAGNOSIS — Z8719 Personal history of other diseases of the digestive system: Secondary | ICD-10-CM | POA: Diagnosis not present

## 2022-01-25 DIAGNOSIS — I672 Cerebral atherosclerosis: Secondary | ICD-10-CM | POA: Diagnosis not present

## 2022-01-25 DIAGNOSIS — R188 Other ascites: Secondary | ICD-10-CM | POA: Diagnosis not present

## 2022-01-25 DIAGNOSIS — R000 Tachycardia, unspecified: Secondary | ICD-10-CM | POA: Diagnosis not present

## 2022-01-25 DIAGNOSIS — I4891 Unspecified atrial fibrillation: Secondary | ICD-10-CM | POA: Diagnosis not present

## 2022-01-25 DIAGNOSIS — E7220 Disorder of urea cycle metabolism, unspecified: Secondary | ICD-10-CM | POA: Diagnosis not present

## 2022-01-25 DIAGNOSIS — R601 Generalized edema: Secondary | ICD-10-CM | POA: Diagnosis not present

## 2022-01-25 DIAGNOSIS — J019 Acute sinusitis, unspecified: Secondary | ICD-10-CM | POA: Diagnosis not present

## 2022-01-25 DIAGNOSIS — D696 Thrombocytopenia, unspecified: Secondary | ICD-10-CM | POA: Diagnosis not present

## 2022-01-25 DIAGNOSIS — K7469 Other cirrhosis of liver: Secondary | ICD-10-CM | POA: Diagnosis not present

## 2022-01-25 NOTE — Telephone Encounter (Signed)
Patient currently admitted.   [facility] Night - ClientTELEPHONE ADVICE RECORDAccessNurse PatientName:Steve Watson Initial Comment Caller states her husband cannot get up the because he keeps falling down. He keeps going to sleep. Translation No Nurse Assessment Nurse: [name], RN, [name] Date/Time (Eastern Time): 01/23/2022 4:43:33 PM Confirm and document reason for call. If symptomatic, describe symptoms. ---Caller states her husband cannot get up the because he keeps falling down. He keeps going to sleep. He has been on the floor most of the night. Today his son came and put him on the sofa. He fell again, and the son came and helped him again. He is not able to stand on his own. He was able to stand on his own yesterday. Does the patient have any new or worsening symptoms? ---Yes Will a triage be completed? ---Yes Related visit to physician within the last 2 weeks? ---No Does the PT have any chronic conditions? (i.e. diabetes, asthma, this includes High risk factors for pregnancy, etc.) ---Yes List chronic conditions. ---liver cirrhosis Is this a behavioral health or substance abuse call? ---No Guidelines Guideline Title Affirmed Question Affirmed Notes Nurse Date/Time (Eastern Time) Neurologic Deficit [1] SEVERE weakness (i.e., unable to walk or barely able to walk, requires support) [name], [name], [name] 01/23/2022 4:45:50 PM PLEASE NOTE: All timestamps contained within this report are represented as [place] Standard Time. CONFIDENTIALTY NOTICE: This fax transmission is intended only for the addressee. It contains information that is legally privileged, confidential or otherwise protected from use or disclosure. If you are not the intended recipient, you are strictly prohibited from reviewing, disclosing, copying using or disseminating any of this information or taking any action in reliance on or regarding this information. If you have  received this fax in error, please notify us immediately by telephone so that we can arrange for its return to [place]. Phone: [phone], Toll-Free: [phone], Fax: [phone] Page: 2 of 2 Call Id: [id number] Guidelines Guideline Title Affirmed Question Affirmed Notes Nurse Date/Time [name] Time) AND [2] new-onset or worsening Disp. Time [name] Time) Disposition Final User 01/23/2022 4:42:25 PM Send to Urgent Queue [name] 01/23/2022 4:46:11 PM Call EMS 911 Now Yes [name], [name], [name] 01/23/2022 4:54:54 PM [name], RN, [name] Reason: unable to reach pt Final Disposition 01/23/2022 4:46:11 PM Call EMS 911 Now Yes [name], RN, [name] Disagree/Comply Comply Caller Understands Yes PreDisposition InappropriateToAsk Care Advice Given Per Guideline CALL EMS 911 NOW: * Immediate medical attention is needed. You need to hang up and call 911 (or an ambulance). * Triager Discretion: I'll call you back in a few minutes to be sure you were able to reach them. CARE ADVICE given per Neurologic Deficit (Adult) guideline

## 2022-01-26 DIAGNOSIS — Z8719 Personal history of other diseases of the digestive system: Secondary | ICD-10-CM | POA: Diagnosis not present

## 2022-01-26 DIAGNOSIS — D539 Nutritional anemia, unspecified: Secondary | ICD-10-CM | POA: Diagnosis not present

## 2022-01-26 DIAGNOSIS — R601 Generalized edema: Secondary | ICD-10-CM | POA: Diagnosis not present

## 2022-01-26 DIAGNOSIS — D696 Thrombocytopenia, unspecified: Secondary | ICD-10-CM | POA: Diagnosis not present

## 2022-01-26 DIAGNOSIS — K5781 Diverticulitis of intestine, part unspecified, with perforation and abscess with bleeding: Secondary | ICD-10-CM | POA: Diagnosis not present

## 2022-01-26 DIAGNOSIS — I4891 Unspecified atrial fibrillation: Secondary | ICD-10-CM | POA: Diagnosis not present

## 2022-01-26 DIAGNOSIS — E7220 Disorder of urea cycle metabolism, unspecified: Secondary | ICD-10-CM | POA: Diagnosis not present

## 2022-01-26 DIAGNOSIS — I639 Cerebral infarction, unspecified: Secondary | ICD-10-CM | POA: Diagnosis not present

## 2022-01-26 DIAGNOSIS — K7469 Other cirrhosis of liver: Secondary | ICD-10-CM | POA: Diagnosis not present

## 2022-01-26 DIAGNOSIS — K7682 Hepatic encephalopathy: Secondary | ICD-10-CM | POA: Diagnosis not present

## 2022-01-26 DIAGNOSIS — E119 Type 2 diabetes mellitus without complications: Secondary | ICD-10-CM | POA: Diagnosis not present

## 2022-01-27 DIAGNOSIS — I4891 Unspecified atrial fibrillation: Secondary | ICD-10-CM | POA: Diagnosis not present

## 2022-01-27 DIAGNOSIS — E7220 Disorder of urea cycle metabolism, unspecified: Secondary | ICD-10-CM | POA: Diagnosis not present

## 2022-01-27 DIAGNOSIS — K5781 Diverticulitis of intestine, part unspecified, with perforation and abscess with bleeding: Secondary | ICD-10-CM | POA: Diagnosis not present

## 2022-01-27 DIAGNOSIS — Z8719 Personal history of other diseases of the digestive system: Secondary | ICD-10-CM | POA: Diagnosis not present

## 2022-01-27 DIAGNOSIS — R601 Generalized edema: Secondary | ICD-10-CM | POA: Diagnosis not present

## 2022-01-27 DIAGNOSIS — D539 Nutritional anemia, unspecified: Secondary | ICD-10-CM | POA: Diagnosis not present

## 2022-01-27 DIAGNOSIS — E119 Type 2 diabetes mellitus without complications: Secondary | ICD-10-CM | POA: Diagnosis not present

## 2022-01-27 DIAGNOSIS — K7469 Other cirrhosis of liver: Secondary | ICD-10-CM | POA: Diagnosis not present

## 2022-01-27 DIAGNOSIS — D696 Thrombocytopenia, unspecified: Secondary | ICD-10-CM | POA: Diagnosis not present

## 2022-01-27 DIAGNOSIS — K7682 Hepatic encephalopathy: Secondary | ICD-10-CM | POA: Diagnosis not present

## 2022-01-28 DIAGNOSIS — D539 Nutritional anemia, unspecified: Secondary | ICD-10-CM | POA: Diagnosis not present

## 2022-01-28 DIAGNOSIS — N390 Urinary tract infection, site not specified: Secondary | ICD-10-CM | POA: Diagnosis not present

## 2022-01-28 DIAGNOSIS — K7469 Other cirrhosis of liver: Secondary | ICD-10-CM | POA: Diagnosis not present

## 2022-01-28 DIAGNOSIS — D696 Thrombocytopenia, unspecified: Secondary | ICD-10-CM | POA: Diagnosis not present

## 2022-01-28 DIAGNOSIS — K7581 Nonalcoholic steatohepatitis (NASH): Secondary | ICD-10-CM | POA: Diagnosis not present

## 2022-01-28 DIAGNOSIS — E7220 Disorder of urea cycle metabolism, unspecified: Secondary | ICD-10-CM | POA: Diagnosis not present

## 2022-01-28 DIAGNOSIS — R531 Weakness: Secondary | ICD-10-CM | POA: Diagnosis not present

## 2022-01-28 DIAGNOSIS — I4891 Unspecified atrial fibrillation: Secondary | ICD-10-CM | POA: Diagnosis not present

## 2022-01-28 DIAGNOSIS — K7682 Hepatic encephalopathy: Secondary | ICD-10-CM | POA: Diagnosis not present

## 2022-01-28 DIAGNOSIS — B9620 Unspecified Escherichia coli [E. coli] as the cause of diseases classified elsewhere: Secondary | ICD-10-CM | POA: Diagnosis not present

## 2022-01-29 DIAGNOSIS — K7682 Hepatic encephalopathy: Secondary | ICD-10-CM | POA: Diagnosis not present

## 2022-01-29 DIAGNOSIS — D696 Thrombocytopenia, unspecified: Secondary | ICD-10-CM | POA: Diagnosis not present

## 2022-01-29 DIAGNOSIS — N390 Urinary tract infection, site not specified: Secondary | ICD-10-CM | POA: Diagnosis not present

## 2022-01-29 DIAGNOSIS — R531 Weakness: Secondary | ICD-10-CM | POA: Diagnosis not present

## 2022-01-29 DIAGNOSIS — I4891 Unspecified atrial fibrillation: Secondary | ICD-10-CM | POA: Diagnosis not present

## 2022-01-29 DIAGNOSIS — K7469 Other cirrhosis of liver: Secondary | ICD-10-CM | POA: Diagnosis not present

## 2022-01-29 DIAGNOSIS — D539 Nutritional anemia, unspecified: Secondary | ICD-10-CM | POA: Diagnosis not present

## 2022-01-29 DIAGNOSIS — E7220 Disorder of urea cycle metabolism, unspecified: Secondary | ICD-10-CM | POA: Diagnosis not present

## 2022-01-29 DIAGNOSIS — K7581 Nonalcoholic steatohepatitis (NASH): Secondary | ICD-10-CM | POA: Diagnosis not present

## 2022-01-29 DIAGNOSIS — B9620 Unspecified Escherichia coli [E. coli] as the cause of diseases classified elsewhere: Secondary | ICD-10-CM | POA: Diagnosis not present

## 2022-01-30 DIAGNOSIS — E7220 Disorder of urea cycle metabolism, unspecified: Secondary | ICD-10-CM | POA: Diagnosis not present

## 2022-01-30 DIAGNOSIS — D539 Nutritional anemia, unspecified: Secondary | ICD-10-CM | POA: Diagnosis not present

## 2022-01-30 DIAGNOSIS — D696 Thrombocytopenia, unspecified: Secondary | ICD-10-CM | POA: Diagnosis not present

## 2022-01-30 DIAGNOSIS — B9620 Unspecified Escherichia coli [E. coli] as the cause of diseases classified elsewhere: Secondary | ICD-10-CM | POA: Diagnosis not present

## 2022-01-30 DIAGNOSIS — K7469 Other cirrhosis of liver: Secondary | ICD-10-CM | POA: Diagnosis not present

## 2022-01-30 DIAGNOSIS — N390 Urinary tract infection, site not specified: Secondary | ICD-10-CM | POA: Diagnosis not present

## 2022-01-30 DIAGNOSIS — R531 Weakness: Secondary | ICD-10-CM | POA: Diagnosis not present

## 2022-01-30 DIAGNOSIS — I4891 Unspecified atrial fibrillation: Secondary | ICD-10-CM | POA: Diagnosis not present

## 2022-01-30 DIAGNOSIS — K7682 Hepatic encephalopathy: Secondary | ICD-10-CM | POA: Diagnosis not present

## 2022-01-30 DIAGNOSIS — K7581 Nonalcoholic steatohepatitis (NASH): Secondary | ICD-10-CM | POA: Diagnosis not present

## 2022-01-31 DIAGNOSIS — B9620 Unspecified Escherichia coli [E. coli] as the cause of diseases classified elsewhere: Secondary | ICD-10-CM | POA: Diagnosis not present

## 2022-01-31 DIAGNOSIS — D539 Nutritional anemia, unspecified: Secondary | ICD-10-CM | POA: Diagnosis not present

## 2022-01-31 DIAGNOSIS — E7220 Disorder of urea cycle metabolism, unspecified: Secondary | ICD-10-CM | POA: Diagnosis not present

## 2022-01-31 DIAGNOSIS — K7469 Other cirrhosis of liver: Secondary | ICD-10-CM | POA: Diagnosis not present

## 2022-01-31 DIAGNOSIS — R531 Weakness: Secondary | ICD-10-CM | POA: Diagnosis not present

## 2022-01-31 DIAGNOSIS — D696 Thrombocytopenia, unspecified: Secondary | ICD-10-CM | POA: Diagnosis not present

## 2022-01-31 DIAGNOSIS — N390 Urinary tract infection, site not specified: Secondary | ICD-10-CM | POA: Diagnosis not present

## 2022-01-31 DIAGNOSIS — K7581 Nonalcoholic steatohepatitis (NASH): Secondary | ICD-10-CM | POA: Diagnosis not present

## 2022-01-31 DIAGNOSIS — I4891 Unspecified atrial fibrillation: Secondary | ICD-10-CM | POA: Diagnosis not present

## 2022-01-31 DIAGNOSIS — K7682 Hepatic encephalopathy: Secondary | ICD-10-CM | POA: Diagnosis not present

## 2022-02-01 DIAGNOSIS — K7682 Hepatic encephalopathy: Secondary | ICD-10-CM | POA: Diagnosis not present

## 2022-02-01 DIAGNOSIS — K7469 Other cirrhosis of liver: Secondary | ICD-10-CM | POA: Diagnosis not present

## 2022-02-01 DIAGNOSIS — N390 Urinary tract infection, site not specified: Secondary | ICD-10-CM | POA: Diagnosis not present

## 2022-02-01 DIAGNOSIS — E8809 Other disorders of plasma-protein metabolism, not elsewhere classified: Secondary | ICD-10-CM | POA: Diagnosis not present

## 2022-02-01 DIAGNOSIS — D539 Nutritional anemia, unspecified: Secondary | ICD-10-CM | POA: Diagnosis not present

## 2022-02-01 DIAGNOSIS — B9620 Unspecified Escherichia coli [E. coli] as the cause of diseases classified elsewhere: Secondary | ICD-10-CM | POA: Diagnosis not present

## 2022-02-01 DIAGNOSIS — I4891 Unspecified atrial fibrillation: Secondary | ICD-10-CM | POA: Diagnosis not present

## 2022-02-01 DIAGNOSIS — K7581 Nonalcoholic steatohepatitis (NASH): Secondary | ICD-10-CM | POA: Diagnosis not present

## 2022-02-01 DIAGNOSIS — D696 Thrombocytopenia, unspecified: Secondary | ICD-10-CM | POA: Diagnosis not present

## 2022-02-01 DIAGNOSIS — R531 Weakness: Secondary | ICD-10-CM | POA: Diagnosis not present

## 2022-02-02 DIAGNOSIS — K7682 Hepatic encephalopathy: Secondary | ICD-10-CM | POA: Diagnosis not present

## 2022-02-02 DIAGNOSIS — K7469 Other cirrhosis of liver: Secondary | ICD-10-CM | POA: Diagnosis not present

## 2022-02-02 DIAGNOSIS — D696 Thrombocytopenia, unspecified: Secondary | ICD-10-CM | POA: Diagnosis not present

## 2022-02-02 DIAGNOSIS — E8809 Other disorders of plasma-protein metabolism, not elsewhere classified: Secondary | ICD-10-CM | POA: Diagnosis not present

## 2022-02-03 DIAGNOSIS — N390 Urinary tract infection, site not specified: Secondary | ICD-10-CM | POA: Diagnosis not present

## 2022-02-03 DIAGNOSIS — R770 Abnormality of albumin: Secondary | ICD-10-CM | POA: Diagnosis not present

## 2022-02-03 DIAGNOSIS — K7460 Unspecified cirrhosis of liver: Secondary | ICD-10-CM | POA: Diagnosis not present

## 2022-02-03 DIAGNOSIS — R4181 Age-related cognitive decline: Secondary | ICD-10-CM | POA: Diagnosis not present

## 2022-02-03 DIAGNOSIS — R41841 Cognitive communication deficit: Secondary | ICD-10-CM | POA: Diagnosis not present

## 2022-02-03 DIAGNOSIS — I4891 Unspecified atrial fibrillation: Secondary | ICD-10-CM | POA: Diagnosis not present

## 2022-02-03 DIAGNOSIS — I951 Orthostatic hypotension: Secondary | ICD-10-CM | POA: Diagnosis not present

## 2022-02-03 DIAGNOSIS — G3184 Mild cognitive impairment, so stated: Secondary | ICD-10-CM | POA: Diagnosis not present

## 2022-02-03 DIAGNOSIS — Z87440 Personal history of urinary (tract) infections: Secondary | ICD-10-CM | POA: Diagnosis not present

## 2022-02-03 DIAGNOSIS — R5381 Other malaise: Secondary | ICD-10-CM | POA: Diagnosis not present

## 2022-02-03 DIAGNOSIS — E119 Type 2 diabetes mellitus without complications: Secondary | ICD-10-CM | POA: Diagnosis not present

## 2022-02-03 DIAGNOSIS — D6959 Other secondary thrombocytopenia: Secondary | ICD-10-CM | POA: Diagnosis not present

## 2022-02-03 DIAGNOSIS — K7581 Nonalcoholic steatohepatitis (NASH): Secondary | ICD-10-CM | POA: Diagnosis not present

## 2022-02-03 DIAGNOSIS — R600 Localized edema: Secondary | ICD-10-CM | POA: Diagnosis not present

## 2022-02-03 DIAGNOSIS — R4182 Altered mental status, unspecified: Secondary | ICD-10-CM | POA: Diagnosis not present

## 2022-02-03 DIAGNOSIS — D696 Thrombocytopenia, unspecified: Secondary | ICD-10-CM | POA: Diagnosis not present

## 2022-02-03 DIAGNOSIS — E8809 Other disorders of plasma-protein metabolism, not elsewhere classified: Secondary | ICD-10-CM | POA: Diagnosis not present

## 2022-02-03 DIAGNOSIS — K7682 Hepatic encephalopathy: Secondary | ICD-10-CM | POA: Diagnosis not present

## 2022-02-03 DIAGNOSIS — D539 Nutritional anemia, unspecified: Secondary | ICD-10-CM | POA: Diagnosis not present

## 2022-02-03 DIAGNOSIS — K7689 Other specified diseases of liver: Secondary | ICD-10-CM | POA: Diagnosis not present

## 2022-02-03 DIAGNOSIS — K5909 Other constipation: Secondary | ICD-10-CM | POA: Diagnosis not present

## 2022-02-03 DIAGNOSIS — B9620 Unspecified Escherichia coli [E. coli] as the cause of diseases classified elsewhere: Secondary | ICD-10-CM | POA: Diagnosis not present

## 2022-02-03 DIAGNOSIS — K7469 Other cirrhosis of liver: Secondary | ICD-10-CM | POA: Diagnosis not present

## 2022-02-04 DIAGNOSIS — I951 Orthostatic hypotension: Secondary | ICD-10-CM | POA: Diagnosis not present

## 2022-02-04 DIAGNOSIS — K7460 Unspecified cirrhosis of liver: Secondary | ICD-10-CM | POA: Diagnosis not present

## 2022-02-04 DIAGNOSIS — K7682 Hepatic encephalopathy: Secondary | ICD-10-CM | POA: Diagnosis not present

## 2022-02-04 DIAGNOSIS — K7689 Other specified diseases of liver: Secondary | ICD-10-CM | POA: Diagnosis not present

## 2022-02-04 DIAGNOSIS — R5381 Other malaise: Secondary | ICD-10-CM | POA: Diagnosis not present

## 2022-02-04 DIAGNOSIS — D539 Nutritional anemia, unspecified: Secondary | ICD-10-CM | POA: Diagnosis not present

## 2022-02-04 DIAGNOSIS — K5909 Other constipation: Secondary | ICD-10-CM | POA: Diagnosis not present

## 2022-02-04 DIAGNOSIS — D6959 Other secondary thrombocytopenia: Secondary | ICD-10-CM | POA: Diagnosis not present

## 2022-02-04 DIAGNOSIS — E8809 Other disorders of plasma-protein metabolism, not elsewhere classified: Secondary | ICD-10-CM | POA: Diagnosis not present

## 2022-02-04 DIAGNOSIS — E119 Type 2 diabetes mellitus without complications: Secondary | ICD-10-CM | POA: Diagnosis not present

## 2022-02-08 DIAGNOSIS — N390 Urinary tract infection, site not specified: Secondary | ICD-10-CM | POA: Diagnosis not present

## 2022-02-08 DIAGNOSIS — K7682 Hepatic encephalopathy: Secondary | ICD-10-CM | POA: Diagnosis not present

## 2022-02-08 DIAGNOSIS — I951 Orthostatic hypotension: Secondary | ICD-10-CM | POA: Diagnosis not present

## 2022-02-08 DIAGNOSIS — E8809 Other disorders of plasma-protein metabolism, not elsewhere classified: Secondary | ICD-10-CM | POA: Diagnosis not present

## 2022-02-08 DIAGNOSIS — I4891 Unspecified atrial fibrillation: Secondary | ICD-10-CM | POA: Diagnosis not present

## 2022-02-08 DIAGNOSIS — K7689 Other specified diseases of liver: Secondary | ICD-10-CM | POA: Diagnosis not present

## 2022-02-08 DIAGNOSIS — G3184 Mild cognitive impairment, so stated: Secondary | ICD-10-CM | POA: Diagnosis not present

## 2022-02-08 DIAGNOSIS — B9620 Unspecified Escherichia coli [E. coli] as the cause of diseases classified elsewhere: Secondary | ICD-10-CM | POA: Diagnosis not present

## 2022-02-08 DIAGNOSIS — D539 Nutritional anemia, unspecified: Secondary | ICD-10-CM | POA: Diagnosis not present

## 2022-02-08 DIAGNOSIS — E119 Type 2 diabetes mellitus without complications: Secondary | ICD-10-CM | POA: Diagnosis not present

## 2022-02-08 DIAGNOSIS — D6959 Other secondary thrombocytopenia: Secondary | ICD-10-CM | POA: Diagnosis not present

## 2022-02-08 DIAGNOSIS — K7460 Unspecified cirrhosis of liver: Secondary | ICD-10-CM | POA: Diagnosis not present

## 2022-02-11 ENCOUNTER — Ambulatory Visit: Payer: No Typology Code available for payment source | Admitting: Medical

## 2022-02-14 ENCOUNTER — Inpatient Hospital Stay: Payer: Medicare HMO

## 2022-02-14 ENCOUNTER — Inpatient Hospital Stay: Payer: Medicare HMO | Admitting: Hematology & Oncology

## 2022-02-16 DIAGNOSIS — D539 Nutritional anemia, unspecified: Secondary | ICD-10-CM | POA: Diagnosis not present

## 2022-02-16 DIAGNOSIS — D6959 Other secondary thrombocytopenia: Secondary | ICD-10-CM | POA: Diagnosis not present

## 2022-02-16 DIAGNOSIS — I951 Orthostatic hypotension: Secondary | ICD-10-CM | POA: Diagnosis not present

## 2022-02-16 DIAGNOSIS — R5381 Other malaise: Secondary | ICD-10-CM | POA: Diagnosis not present

## 2022-02-16 DIAGNOSIS — E119 Type 2 diabetes mellitus without complications: Secondary | ICD-10-CM | POA: Diagnosis not present

## 2022-02-16 DIAGNOSIS — K7689 Other specified diseases of liver: Secondary | ICD-10-CM | POA: Diagnosis not present

## 2022-02-16 DIAGNOSIS — I4891 Unspecified atrial fibrillation: Secondary | ICD-10-CM | POA: Diagnosis not present

## 2022-02-16 DIAGNOSIS — G3184 Mild cognitive impairment, so stated: Secondary | ICD-10-CM | POA: Diagnosis not present

## 2022-02-16 DIAGNOSIS — K7682 Hepatic encephalopathy: Secondary | ICD-10-CM | POA: Diagnosis not present

## 2022-02-16 DIAGNOSIS — E8809 Other disorders of plasma-protein metabolism, not elsewhere classified: Secondary | ICD-10-CM | POA: Diagnosis not present

## 2022-02-16 DIAGNOSIS — K7460 Unspecified cirrhosis of liver: Secondary | ICD-10-CM | POA: Diagnosis not present

## 2022-02-18 DIAGNOSIS — R4182 Altered mental status, unspecified: Secondary | ICD-10-CM | POA: Diagnosis not present

## 2022-02-18 DIAGNOSIS — K7460 Unspecified cirrhosis of liver: Secondary | ICD-10-CM | POA: Diagnosis not present

## 2022-02-18 DIAGNOSIS — E119 Type 2 diabetes mellitus without complications: Secondary | ICD-10-CM | POA: Diagnosis not present

## 2022-02-18 DIAGNOSIS — Z87440 Personal history of urinary (tract) infections: Secondary | ICD-10-CM | POA: Diagnosis not present

## 2022-04-12 ENCOUNTER — Telehealth: Payer: Self-pay

## 2022-04-12 NOTE — Telephone Encounter (Signed)
Caller Name: [facility] Phone #:   [phone]  Date of death: 05/08/2022 Place of death: [facility]  Time of death: 9:42 AM

## 2022-04-25 DEATH — deceased

## 2022-06-23 IMAGING — DX DG CHEST 1V PORT
1 series · 1 of 1 positions shown · non-contrast
Comparison: Prior chest radiographs 08/19/2020 and earlier.

CLINICAL DATA: Provided history: Altered mental status. Additional
history provided: Difficulty walking, decreased strength, increased
confusion. Jaundice.

EXAM:
PORTABLE CHEST 1 VIEW

[chest ap]
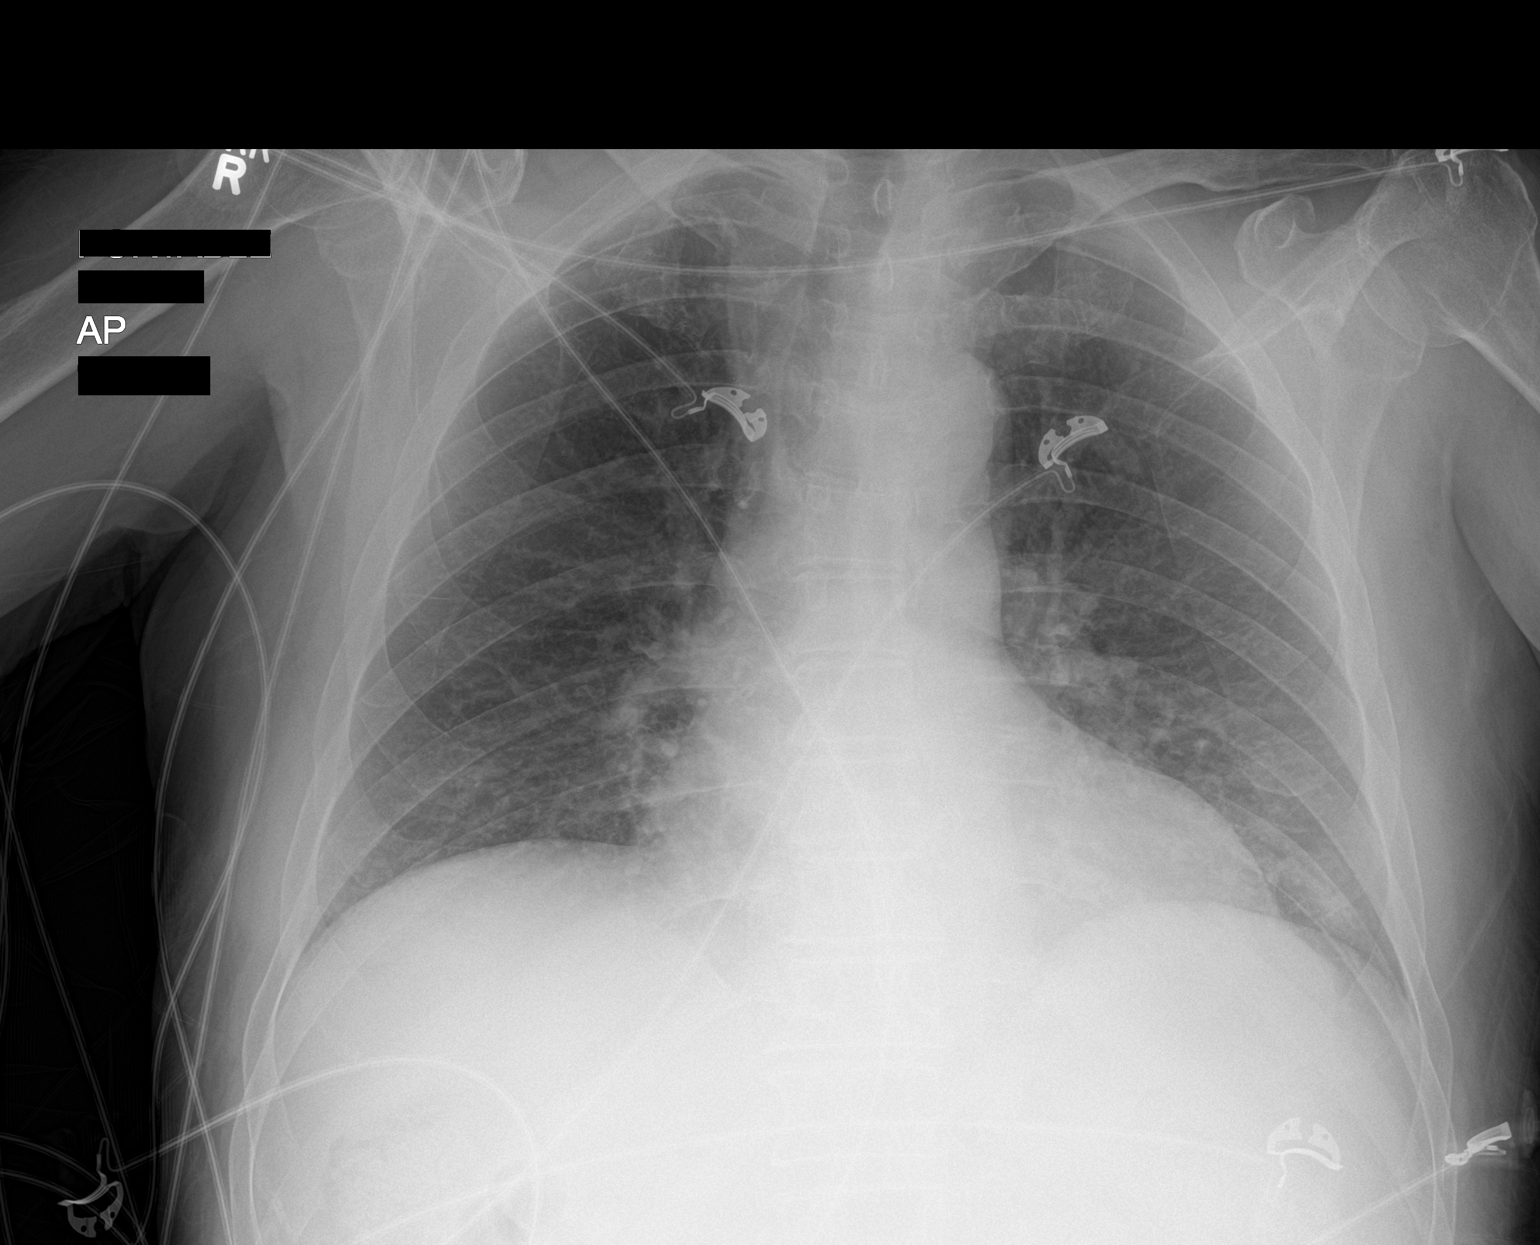

[1 of 1 positions shown; findings below may reference images not displayed]

FINDINGS: Heart size within normal limits. Subtle ill-defined opacity within
the bilateral left lung base, favored to reflect atelectasis. No
definite airspace consolidation. No evidence of pleural effusion or
pneumothorax. No acute bony abnormality identified.
IMPRESSION: Subtle ill-defined opacity within the lateral left lung base,
favored to reflect atelectasis.

Otherwise, no evidence of acute cardiopulmonary abnormality.

## 2022-06-23 IMAGING — CT CT HEAD W/O CM
3 series · 14 of 47 positions shown, 16 images · non-contrast
Comparison: Head CT 07/07/2013.

CLINICAL DATA: 81-year-old male with difficulty walking, weakness,
confusion, jaundiced.



[Series 2: head wo · axial · 0.47mm/px · z∈[+866,+990]mm · 8 of 30 slices shown, 10 images]
[im 3/30  brain]
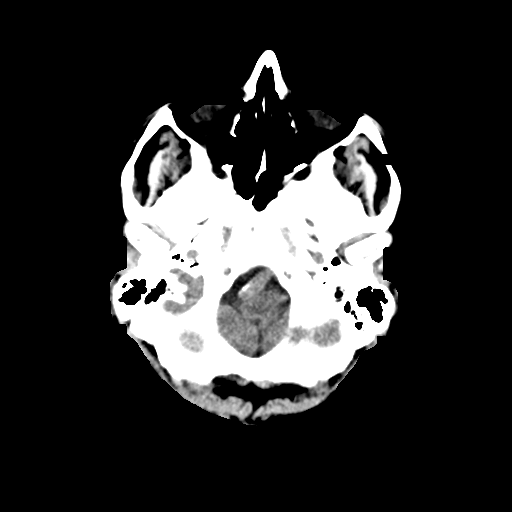
[im 3/30  bone]
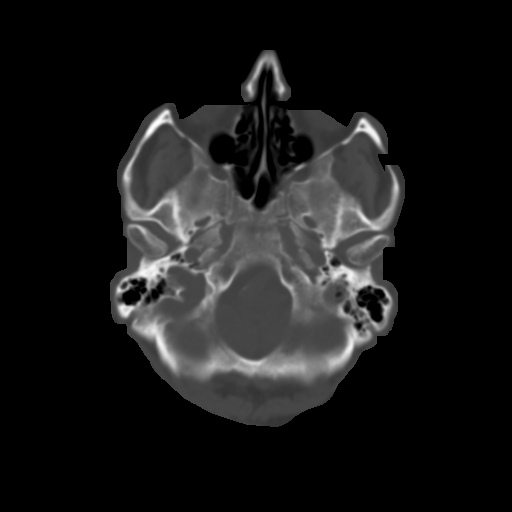
[im 7/30  brain]
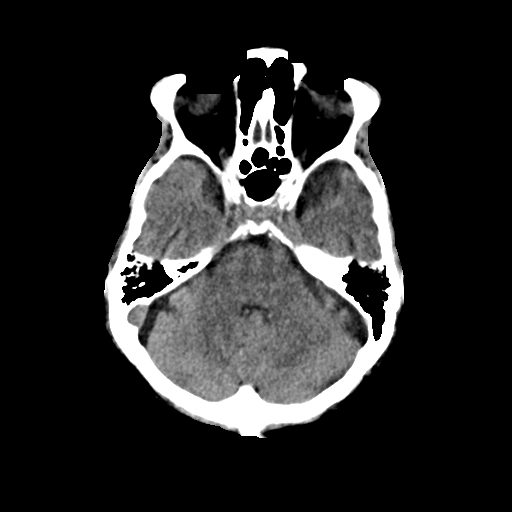
[im 10/30  brain]
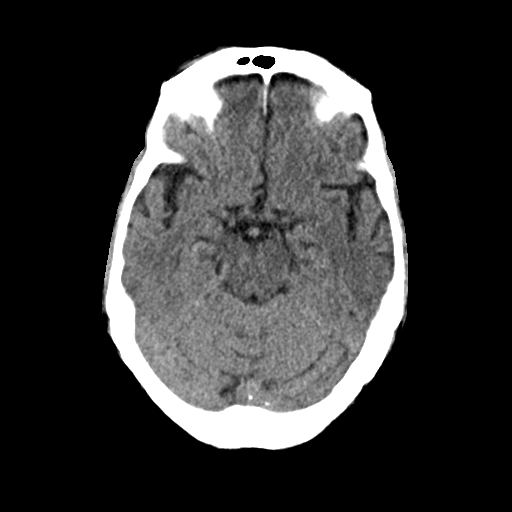
[im 14/30  brain]
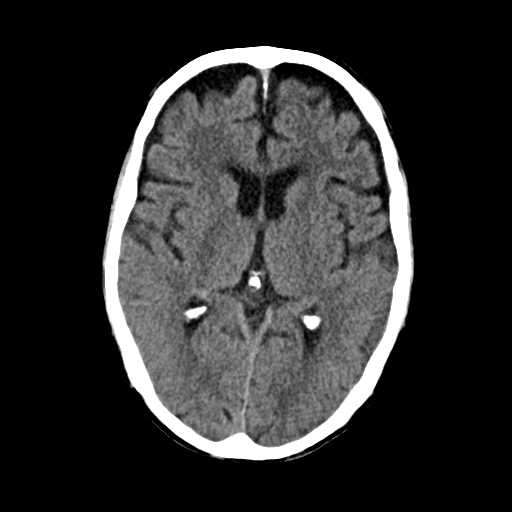
[im 17/30  brain]
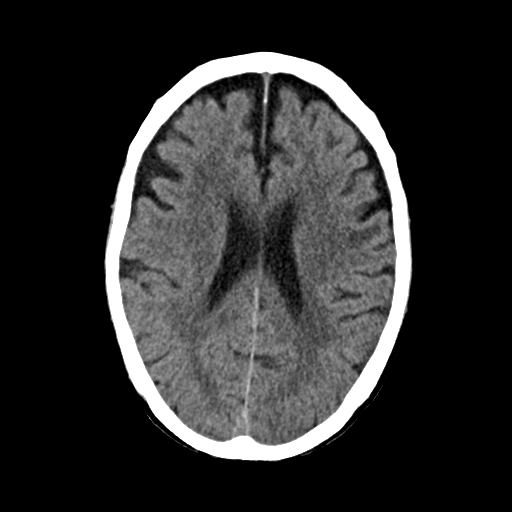
[im 17/30  bone]
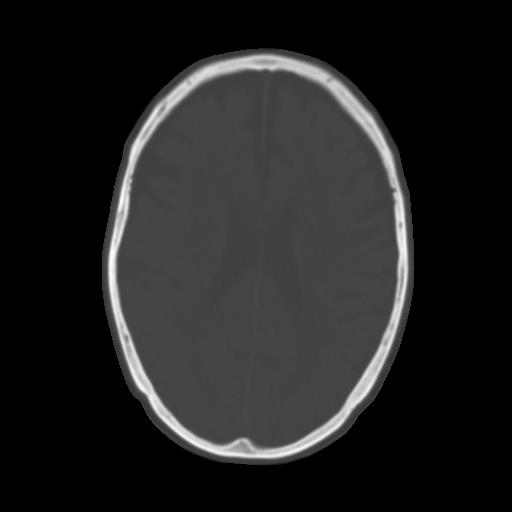
[im 21/30  brain]
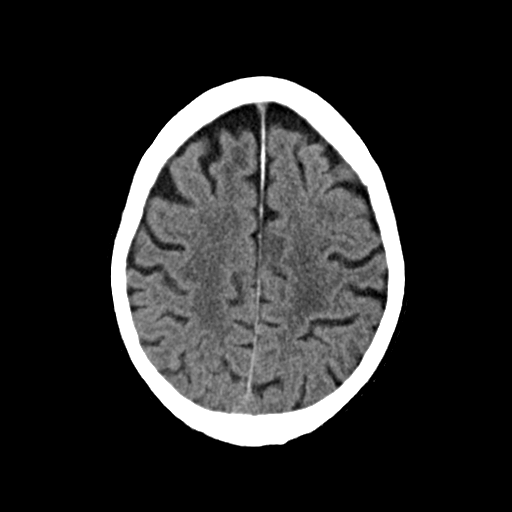
[im 24/30  brain]
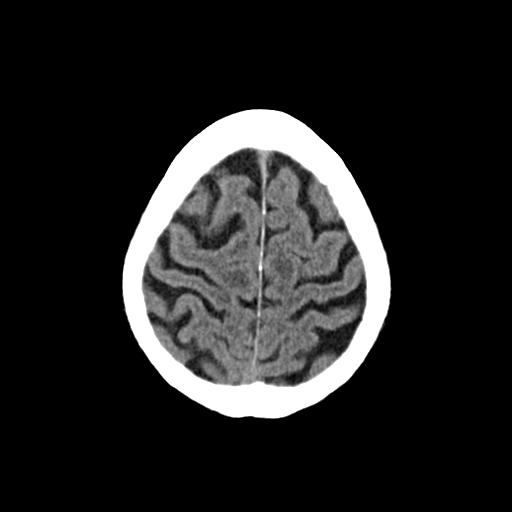
[im 28/30  brain]
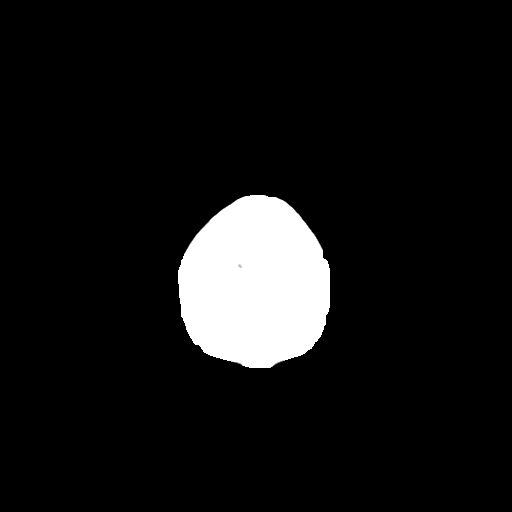

[Series 3: coronal soft · coronal · 0.30mm/px · 3 of 68 slices shown]
[im 23/68  brain]
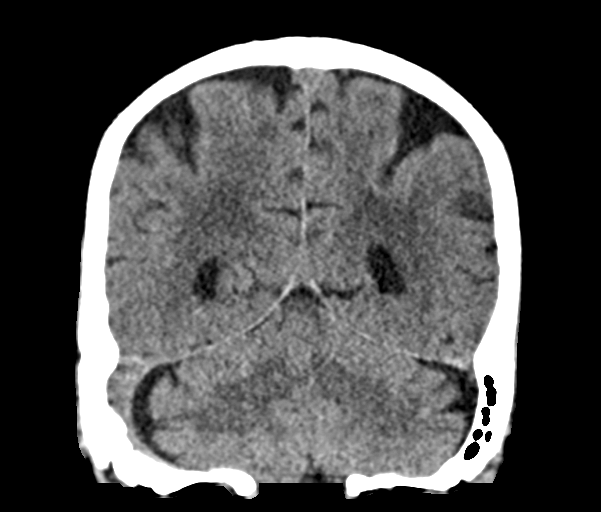
[im 30/68  brain]
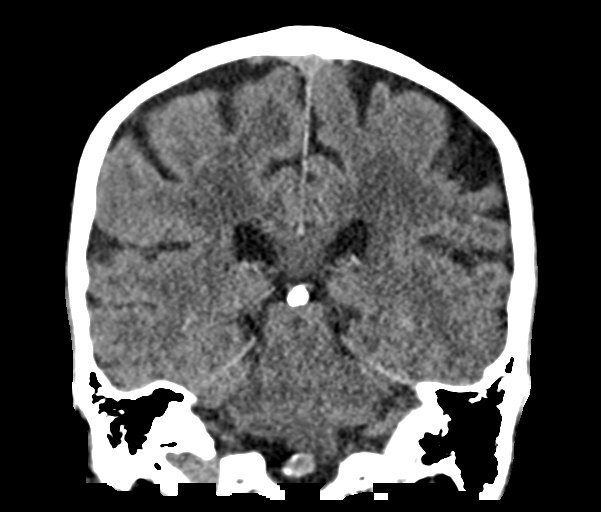
[im 38/68  brain]
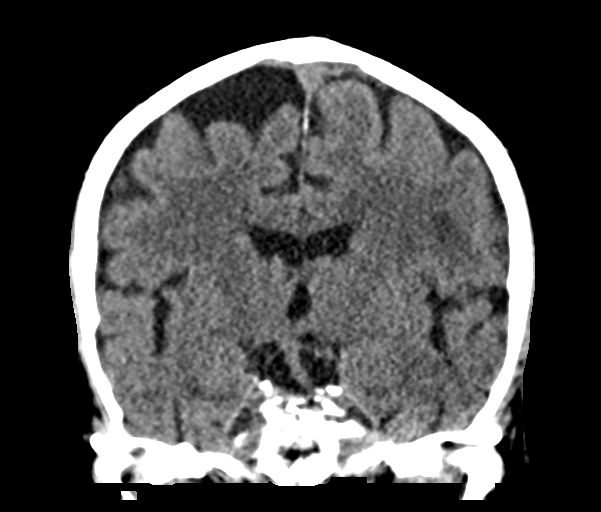

[Series 5: sag soft · sagittal · 0.31mm/px · 3 of 55 slices shown]
[im 19/55  brain]
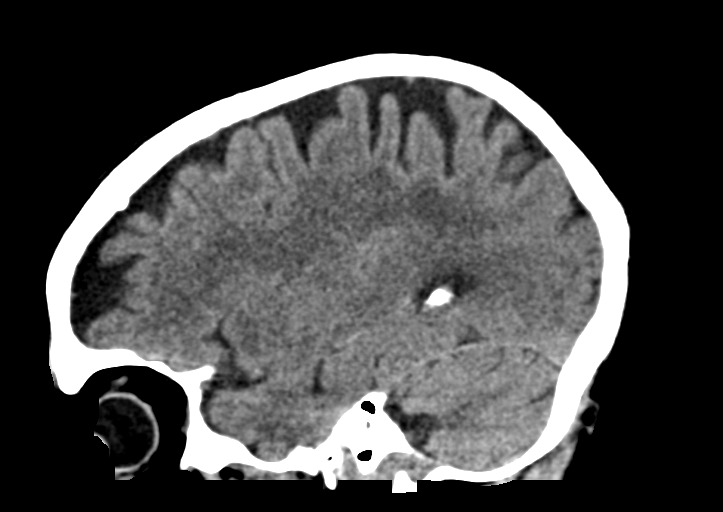
[im 28/55  brain]
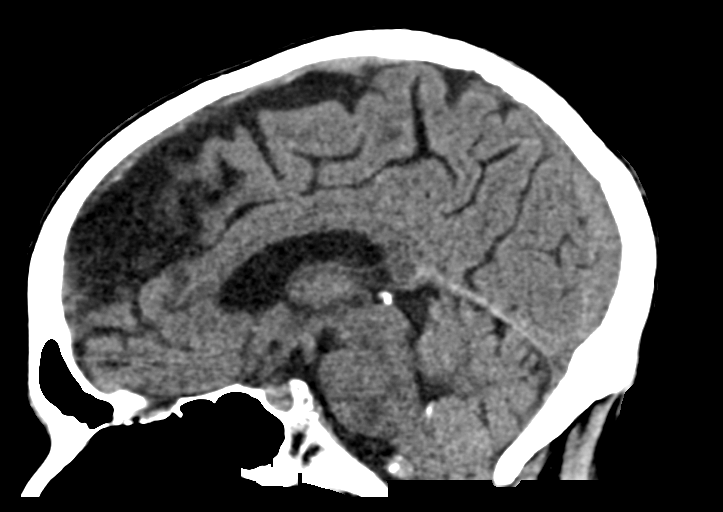
[im 37/55  brain]
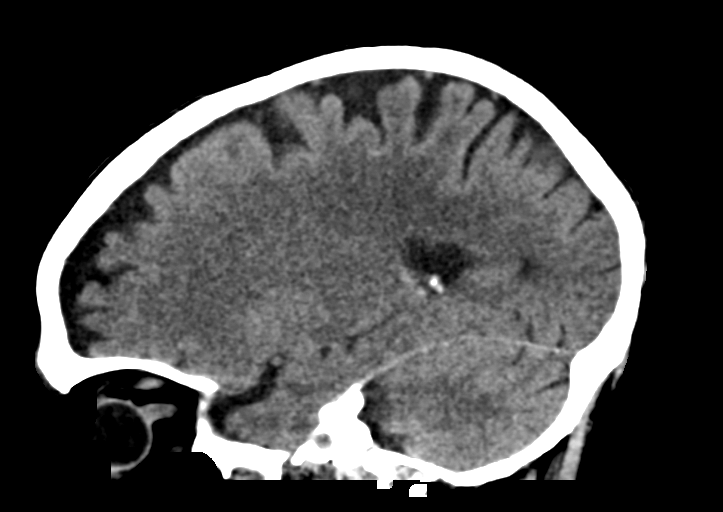

[14 of 47 positions shown; findings below may reference images not displayed]

FINDINGS: Brain: Some generalized cerebral volume loss since 1082. No midline
shift, ventriculomegaly, mass effect, evidence of mass lesion,
intracranial hemorrhage or evidence of cortically based acute
infarction. Patchy bilateral cerebral white matter hypodensity is
increased and appears moderate for age. Deep gray nuclei, brainstem
and cerebellum appear spared. No cortical encephalomalacia
identified.

Vascular: Calcified atherosclerosis at the skull base. No suspicious
intracranial vascular hyperdensity.

Skull: No acute or suspicious osseous lesion identified.

Sinuses/Orbits: Visualized paranasal sinuses and mastoids are stable
and well aerated.

Other: Visualized orbits and scalp soft tissues are within normal
limits.
IMPRESSION: 1. No acute intracranial abnormality.
2. Generalized cerebral volume loss since 1082, and moderate for age
cerebral white matter changes, most commonly due to chronic small
vessel disease.
